# Patient Record
Sex: Female | Born: 1935
Health system: Southern US, Community
[De-identification: ages and names within clinical notes are randomized; demographics above are authoritative.]

## PROBLEM LIST (undated history)

## (undated) DIAGNOSIS — K219 Gastro-esophageal reflux disease without esophagitis: Secondary | ICD-10-CM

## (undated) DIAGNOSIS — C4490 Unspecified malignant neoplasm of skin, unspecified: Secondary | ICD-10-CM

## (undated) DIAGNOSIS — I10 Essential (primary) hypertension: Secondary | ICD-10-CM

## (undated) DIAGNOSIS — E039 Hypothyroidism, unspecified: Secondary | ICD-10-CM

## (undated) DIAGNOSIS — J449 Chronic obstructive pulmonary disease, unspecified: Secondary | ICD-10-CM

## (undated) DIAGNOSIS — E785 Hyperlipidemia, unspecified: Secondary | ICD-10-CM

## (undated) DIAGNOSIS — Z8489 Family history of other specified conditions: Secondary | ICD-10-CM

## (undated) DIAGNOSIS — R0600 Dyspnea, unspecified: Secondary | ICD-10-CM

## (undated) DIAGNOSIS — K649 Unspecified hemorrhoids: Secondary | ICD-10-CM

## (undated) DIAGNOSIS — R600 Localized edema: Secondary | ICD-10-CM

## (undated) DIAGNOSIS — G4730 Sleep apnea, unspecified: Secondary | ICD-10-CM

## (undated) DIAGNOSIS — M48061 Spinal stenosis, lumbar region without neurogenic claudication: Secondary | ICD-10-CM

## (undated) DIAGNOSIS — T4145XA Adverse effect of unspecified anesthetic, initial encounter: Secondary | ICD-10-CM

## (undated) DIAGNOSIS — E079 Disorder of thyroid, unspecified: Secondary | ICD-10-CM

## (undated) DIAGNOSIS — C801 Malignant (primary) neoplasm, unspecified: Secondary | ICD-10-CM

## (undated) DIAGNOSIS — R011 Cardiac murmur, unspecified: Secondary | ICD-10-CM

## (undated) DIAGNOSIS — Z923 Personal history of irradiation: Secondary | ICD-10-CM

## (undated) DIAGNOSIS — T8859XA Other complications of anesthesia, initial encounter: Secondary | ICD-10-CM

## (undated) DIAGNOSIS — C50919 Malignant neoplasm of unspecified site of unspecified female breast: Secondary | ICD-10-CM

## (undated) DIAGNOSIS — E119 Type 2 diabetes mellitus without complications: Secondary | ICD-10-CM

## (undated) DIAGNOSIS — Z87442 Personal history of urinary calculi: Secondary | ICD-10-CM

## (undated) HISTORY — DX: Unspecified malignant neoplasm of skin, unspecified: C44.90

## (undated) HISTORY — PX: TONSILLECTOMY: SUR1361

## (undated) HISTORY — PX: CHOLECYSTECTOMY: SHX55

## (undated) HISTORY — PX: JOINT REPLACEMENT: SHX530

## (undated) HISTORY — PX: SKIN CANCER EXCISION: SHX779

---
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
## 1980-06-29 HISTORY — PX: ABDOMINAL HYSTERECTOMY: SHX81

## 2001-06-29 HISTORY — PX: THYROIDECTOMY, PARTIAL: SHX18

## 2001-06-29 HISTORY — PX: EYE SURGERY: SHX253

## 2001-06-29 HISTORY — PX: OTHER SURGICAL HISTORY: SHX169

## 2004-05-07 ENCOUNTER — Ambulatory Visit: Payer: Self-pay | Admitting: Unknown Physician Specialty

## 2004-08-05 ENCOUNTER — Ambulatory Visit: Payer: Self-pay | Admitting: Unknown Physician Specialty

## 2005-08-25 ENCOUNTER — Ambulatory Visit: Payer: Self-pay | Admitting: Unknown Physician Specialty

## 2006-01-21 ENCOUNTER — Ambulatory Visit: Payer: Self-pay | Admitting: Gastroenterology

## 2006-09-27 ENCOUNTER — Ambulatory Visit: Payer: Self-pay | Admitting: Unknown Physician Specialty

## 2006-09-29 ENCOUNTER — Ambulatory Visit: Payer: Self-pay | Admitting: Unknown Physician Specialty

## 2007-05-11 ENCOUNTER — Ambulatory Visit: Payer: Self-pay | Admitting: Unknown Physician Specialty

## 2008-01-27 ENCOUNTER — Ambulatory Visit: Payer: Self-pay | Admitting: Unknown Physician Specialty

## 2008-03-12 ENCOUNTER — Ambulatory Visit: Payer: Self-pay | Admitting: Unknown Physician Specialty

## 2008-03-12 ENCOUNTER — Other Ambulatory Visit: Payer: Self-pay

## 2008-03-26 ENCOUNTER — Inpatient Hospital Stay: Payer: Self-pay | Admitting: Unknown Physician Specialty

## 2008-03-30 ENCOUNTER — Encounter: Payer: Self-pay | Admitting: Internal Medicine

## 2009-01-28 ENCOUNTER — Ambulatory Visit: Payer: Self-pay | Admitting: Unknown Physician Specialty

## 2010-01-13 ENCOUNTER — Ambulatory Visit: Payer: Self-pay | Admitting: Unknown Physician Specialty

## 2010-01-21 ENCOUNTER — Inpatient Hospital Stay: Payer: Self-pay | Admitting: Unknown Physician Specialty

## 2010-04-15 ENCOUNTER — Ambulatory Visit: Payer: Self-pay | Admitting: Unknown Physician Specialty

## 2010-05-01 ENCOUNTER — Ambulatory Visit: Payer: Self-pay | Admitting: Anesthesiology

## 2010-05-29 ENCOUNTER — Ambulatory Visit: Payer: Self-pay | Admitting: Anesthesiology

## 2010-07-09 ENCOUNTER — Ambulatory Visit: Payer: Self-pay | Admitting: Anesthesiology

## 2010-07-16 ENCOUNTER — Ambulatory Visit: Payer: Self-pay | Admitting: Unknown Physician Specialty

## 2010-07-17 LAB — PATHOLOGY REPORT

## 2010-08-20 ENCOUNTER — Ambulatory Visit: Payer: Self-pay | Admitting: Anesthesiology

## 2010-10-21 ENCOUNTER — Ambulatory Visit: Payer: Self-pay | Admitting: Unknown Physician Specialty

## 2011-02-21 ENCOUNTER — Emergency Department: Payer: Self-pay | Admitting: Internal Medicine

## 2011-03-05 ENCOUNTER — Ambulatory Visit: Payer: Self-pay | Admitting: General Surgery

## 2011-03-30 ENCOUNTER — Ambulatory Visit: Payer: Self-pay | Admitting: General Surgery

## 2011-05-27 ENCOUNTER — Ambulatory Visit: Payer: Self-pay | Admitting: Unknown Physician Specialty

## 2012-06-29 HISTORY — PX: JOINT REPLACEMENT: SHX530

## 2012-07-28 ENCOUNTER — Ambulatory Visit: Payer: Self-pay | Admitting: Family Medicine

## 2012-10-17 DIAGNOSIS — D3500 Benign neoplasm of unspecified adrenal gland: Secondary | ICD-10-CM | POA: Insufficient documentation

## 2012-10-17 DIAGNOSIS — E049 Nontoxic goiter, unspecified: Secondary | ICD-10-CM | POA: Insufficient documentation

## 2013-05-15 DIAGNOSIS — E1165 Type 2 diabetes mellitus with hyperglycemia: Secondary | ICD-10-CM | POA: Insufficient documentation

## 2013-05-15 DIAGNOSIS — E119 Type 2 diabetes mellitus without complications: Secondary | ICD-10-CM | POA: Insufficient documentation

## 2013-12-04 ENCOUNTER — Emergency Department: Payer: Self-pay | Admitting: Emergency Medicine

## 2013-12-04 LAB — CBC WITH DIFFERENTIAL/PLATELET
BASOS ABS: 0.1 10*3/uL (ref 0.0–0.1)
Basophil %: 1.1 %
EOS PCT: 1.6 %
Eosinophil #: 0.1 10*3/uL (ref 0.0–0.7)
HCT: 41.6 % (ref 35.0–47.0)
HGB: 13.6 g/dL (ref 12.0–16.0)
LYMPHS ABS: 1.9 10*3/uL (ref 1.0–3.6)
LYMPHS PCT: 25.4 %
MCH: 30.1 pg (ref 26.0–34.0)
MCHC: 32.8 g/dL (ref 32.0–36.0)
MCV: 92 fL (ref 80–100)
Monocyte #: 0.5 x10 3/mm (ref 0.2–0.9)
Monocyte %: 6.3 %
NEUTROS ABS: 4.9 10*3/uL (ref 1.4–6.5)
Neutrophil %: 65.6 %
PLATELETS: 302 10*3/uL (ref 150–440)
RBC: 4.54 10*6/uL (ref 3.80–5.20)
RDW: 12.9 % (ref 11.5–14.5)
WBC: 7.4 10*3/uL (ref 3.6–11.0)

## 2013-12-04 LAB — URINALYSIS, COMPLETE
Bacteria: NONE SEEN
Bilirubin,UR: NEGATIVE
Blood: NEGATIVE
Glucose,UR: NEGATIVE mg/dL (ref 0–75)
Hyaline Cast: 22
Ketone: NEGATIVE
Nitrite: NEGATIVE
PH: 5 (ref 4.5–8.0)
RBC,UR: 1 /HPF (ref 0–5)
Specific Gravity: 1.017 (ref 1.003–1.030)
Squamous Epithelial: 6

## 2013-12-04 LAB — BASIC METABOLIC PANEL
Anion Gap: 6 — ABNORMAL LOW (ref 7–16)
BUN: 20 mg/dL — ABNORMAL HIGH (ref 7–18)
CREATININE: 0.92 mg/dL (ref 0.60–1.30)
Calcium, Total: 9.3 mg/dL (ref 8.5–10.1)
Chloride: 102 mmol/L (ref 98–107)
Co2: 28 mmol/L (ref 21–32)
EGFR (African American): >60
GFR CALC NON AF AMER: 59 — AB
Glucose: 197 mg/dL — ABNORMAL HIGH (ref 65–99)
OSMOLALITY: 280 (ref 275–301)
Potassium: 4.4 mmol/L (ref 3.5–5.1)
Sodium: 136 mmol/L (ref 136–145)

## 2013-12-04 LAB — TROPONIN I: Troponin-I: <0.02 ng/mL

## 2013-12-19 DIAGNOSIS — E785 Hyperlipidemia, unspecified: Secondary | ICD-10-CM | POA: Insufficient documentation

## 2013-12-19 DIAGNOSIS — G4730 Sleep apnea, unspecified: Secondary | ICD-10-CM | POA: Insufficient documentation

## 2013-12-19 DIAGNOSIS — K219 Gastro-esophageal reflux disease without esophagitis: Secondary | ICD-10-CM

## 2013-12-19 DIAGNOSIS — I10 Essential (primary) hypertension: Secondary | ICD-10-CM | POA: Insufficient documentation

## 2014-09-12 ENCOUNTER — Ambulatory Visit: Payer: Self-pay

## 2014-10-22 ENCOUNTER — Ambulatory Visit
Admit: 2014-10-22 | Disposition: A | Discharge status: Home / Self Care | Payer: Self-pay | Attending: Physical Medicine and Rehabilitation | Admitting: Physical Medicine and Rehabilitation

## 2014-11-12 DIAGNOSIS — M5137 Other intervertebral disc degeneration, lumbosacral region: Secondary | ICD-10-CM | POA: Insufficient documentation

## 2014-11-12 DIAGNOSIS — M5136 Other intervertebral disc degeneration, lumbar region: Secondary | ICD-10-CM | POA: Insufficient documentation

## 2014-11-12 DIAGNOSIS — M4716 Other spondylosis with myelopathy, lumbar region: Secondary | ICD-10-CM | POA: Insufficient documentation

## 2014-11-12 DIAGNOSIS — M51379 Other intervertebral disc degeneration, lumbosacral region without mention of lumbar back pain or lower extremity pain: Secondary | ICD-10-CM | POA: Insufficient documentation

## 2014-11-12 DIAGNOSIS — M48061 Spinal stenosis, lumbar region without neurogenic claudication: Secondary | ICD-10-CM | POA: Insufficient documentation

## 2014-11-13 ENCOUNTER — Encounter: Payer: Self-pay | Admitting: Dietician

## 2014-11-13 ENCOUNTER — Encounter: Payer: Medicare Other | Attending: Nurse Practitioner | Admitting: Dietician

## 2014-11-13 VITALS — Ht 60.0 in | Wt 224.1 lb

## 2014-11-13 DIAGNOSIS — E119 Type 2 diabetes mellitus without complications: Secondary | ICD-10-CM | POA: Insufficient documentation

## 2014-11-13 DIAGNOSIS — E669 Obesity, unspecified: Secondary | ICD-10-CM | POA: Insufficient documentation

## 2014-11-13 NOTE — Progress Notes (Signed)
Medical Nutrition Therapy: Visit start time: 0900  end time: 1000  Assessment:  Diagnosis: Type 2 DM, obesity Past medical history: hypoglycemia, HTN, hyperlipidemia, GERD Psychosocial issues/ stress concerns: none, frustration, feeling bad a out self Preferred learning method:  . No preference indicated  Current weight: 224.1  Height: 11ft 0in Medications, supplements: listed in chart Progress and evaluation: Patient reports struggling to lose weight; she has experienced weight gain in past several years since husband's death and knee and hip surgeries.           She has also struggled to control Diabetes with declining physical activity and decreased attention to eating habits.           She hopes to make some lifestyle changes now to improve health and pain. Physical activity: none  Dietary Intake:  Usual eating pattern includes 2-3 meals and 2-3 snacks per day. Dining out frequency: 10-13 meals per week.  Breakfast: sometimes cereal, small amount of protein shake (4oz), sometimes biscuit was also eating hash browns, 1/2-sweet tea Snack: crackers and cheese, pretzels, chips, occasionally fruit (not satisfying) Lunch: sometimes none; salad or chicken and vegetables, chicken sandwich Snack: chips or occasional candy (tries not to keep in house) Supper: salad or [place] (lasts for 2 meals) Snack: usually salty snack, sometimes toast and tea or rice pudding Beverages: very little soda, prefers not to use diet drinks, water, tea, coffee  Nutrition Care Education: Topics covered: diabetes, weight control Basic nutrition: basic food groups, appropriate nutrient balance, general nutrition guidelines    Weight control: benefits of weight control, behavioral changes for weight loss Advanced nutrition:  dining out Diabetes: appropriate meal and snack schedule, appropriate carb intake and balance Other lifestyle changes:  benefits of making changes, identifying habits that need to  change  Nutritional Diagnosis:  [place]-3.3 Overweight/obesity As related to decreased mobility, pain, and lack of attention to diet.  As evidenced by patient self-report.  Intervention: Instruction as noted above.    Provided guidance for 1100-1200kcal meal plan to allow for weight loss.   Encouraged pt to gradually increase safe exercise (she has recumbant bike) as tolerated.   Reset correct time on patient's blood glucose monitor per patient's request.   Education Materials given:  . General diet guidelines for Diabetes . Food lists/ Planning A Balanced Meal . Sample meal pattern/ menus . Goals/ instructions . Other list of definitions of food expiration dates and safety per pt request  Learner/ who was taught:  . Patient   Level of understanding: Marland Kitchen Verbalizes/ demonstrates competency  Demonstrated degree of understanding via:   Teach back Learning barriers: . None  Willingness to learn/ readiness for change: . Acceptance, ready for change  Monitoring and Evaluation:  Dietary intake, exercise, BG control, and body weight 12/18/14

## 2014-11-13 NOTE — Patient Instructions (Signed)
Prepare more meals at home  Plan balanced meals to include palm-sized or less of protein foods, small starch portions (1/2 cup), and generous portions of low-carb vegetables.  Begin using recumbant bike for a few minutes at a time, at least once a day. Portion snacks using small plate or bowl.

## 2014-11-22 ENCOUNTER — Encounter: Payer: Self-pay | Admitting: *Deleted

## 2014-11-23 ENCOUNTER — Encounter: Admission: RE | Payer: Self-pay | Source: Ambulatory Visit

## 2014-11-23 ENCOUNTER — Ambulatory Visit
Admission: RE | Admit: 2014-11-23 | Payer: Medicare Other | Source: Ambulatory Visit | Admitting: Unknown Physician Specialty

## 2014-11-23 HISTORY — DX: Hyperlipidemia, unspecified: E78.5

## 2014-11-23 HISTORY — DX: Sleep apnea, unspecified: G47.30

## 2014-11-23 HISTORY — DX: Essential (primary) hypertension: I10

## 2014-11-23 HISTORY — DX: Type 2 diabetes mellitus without complications: E11.9

## 2014-11-23 SURGERY — COLONOSCOPY
Anesthesia: Monitor Anesthesia Care

## 2014-12-18 ENCOUNTER — Encounter: Payer: Medicare Other | Attending: Nurse Practitioner | Admitting: Dietician

## 2014-12-18 VITALS — Ht 60.0 in | Wt 218.9 lb

## 2014-12-18 DIAGNOSIS — E119 Type 2 diabetes mellitus without complications: Secondary | ICD-10-CM | POA: Diagnosis present

## 2014-12-18 DIAGNOSIS — M5416 Radiculopathy, lumbar region: Secondary | ICD-10-CM | POA: Insufficient documentation

## 2014-12-18 DIAGNOSIS — E669 Obesity, unspecified: Secondary | ICD-10-CM | POA: Insufficient documentation

## 2014-12-18 NOTE — Progress Notes (Signed)
Medical Nutrition Therapy: Visit start time: 1100  end time: 1130  Assessment:  Diagnosis: Type 2 DM, obesity Past medical history: HTN, hyperlipidemia, Degenerative Disc Disease, arthritis in spine, sleep apnea Psychosocial issues/ stress concerns: Patient reports concern over elevated BG readings  Current weight: 218.9lbs  Height: 5'0" Medications, supplements: list reconciled in chart Progress and evaluation: patient reports limiting snacks, eating smaller portions at meals.   Physical activity: no formal exercise, stays busy during the day  Dietary Intake:  Usual eating pattern includes 3 meals and 1-2 snacks per day. Dining out frequency: 3-4 meals per week.  Breakfast: greek yogurt with seeds, milk; or egg with ham and cheese, or bagel with cream cheese Snack: usually none Lunch: salad, with chopped deli meat or chicken Snack: occasional baked chips Supper: cereal and milk or yogurt and milk; had chinese food with steamed veg, small amount rice.  Snack: sometimes yogurt Beverages: water, usually no soda or juice. Did try small glass of blueberry juice.   Nutrition Care Education: Topics covered: weight control, BG control Basic nutrition: general nutrition guidelines    Weight control: portion control, adequacy of food and nutrient intake Diabetes:  goals for BGs, appropriate carb intake and balance, interpreting BG results Other lifestyle changes:  Reviewed benefit of beginning some regular exercise, in particular using recumbent bike  Nutritional Diagnosis:  [place]-3.3 Overweight/obesity As related to history of excess caloric intake, inactivity.  As evidenced by BMI of 42.5.  Intervention: Discussion as noted above.    Also discussed side effects of medications, as patient has been experiencing some diarrhea recently.    Will contact pt by phone in 4-6 weeks to monitor progress.  Education Materials given:   Learner/ who was taught:  . Patient   Level of  understanding: Marland Kitchen Verbalizes/ demonstrates competency  Demonstrated degree of understanding via:   Teach back Learning barriers: . None  Willingness to learn/ readiness for change: . Eager, change in progress  Monitoring and Evaluation:  Dietary intake, exercise, BG control, and body weight      follow up: by phone in 4-6 weeks, to schedule additional visit at that time if needed.

## 2014-12-27 DIAGNOSIS — E039 Hypothyroidism, unspecified: Secondary | ICD-10-CM | POA: Insufficient documentation

## 2015-07-22 DIAGNOSIS — L570 Actinic keratosis: Secondary | ICD-10-CM | POA: Diagnosis not present

## 2015-07-22 DIAGNOSIS — X32XXXA Exposure to sunlight, initial encounter: Secondary | ICD-10-CM | POA: Diagnosis not present

## 2015-07-23 DIAGNOSIS — D0471 Carcinoma in situ of skin of right lower limb, including hip: Secondary | ICD-10-CM | POA: Diagnosis not present

## 2015-07-23 DIAGNOSIS — L570 Actinic keratosis: Secondary | ICD-10-CM | POA: Diagnosis not present

## 2015-07-23 DIAGNOSIS — D485 Neoplasm of uncertain behavior of skin: Secondary | ICD-10-CM | POA: Diagnosis not present

## 2015-07-23 DIAGNOSIS — L820 Inflamed seborrheic keratosis: Secondary | ICD-10-CM | POA: Diagnosis not present

## 2015-08-15 DIAGNOSIS — D0471 Carcinoma in situ of skin of right lower limb, including hip: Secondary | ICD-10-CM | POA: Diagnosis not present

## 2015-08-15 DIAGNOSIS — X32XXXA Exposure to sunlight, initial encounter: Secondary | ICD-10-CM | POA: Diagnosis not present

## 2015-08-15 DIAGNOSIS — L570 Actinic keratosis: Secondary | ICD-10-CM | POA: Diagnosis not present

## 2015-08-20 DIAGNOSIS — L570 Actinic keratosis: Secondary | ICD-10-CM | POA: Diagnosis not present

## 2015-08-29 DIAGNOSIS — E119 Type 2 diabetes mellitus without complications: Secondary | ICD-10-CM | POA: Diagnosis not present

## 2015-08-29 DIAGNOSIS — E782 Mixed hyperlipidemia: Secondary | ICD-10-CM | POA: Diagnosis not present

## 2015-08-29 DIAGNOSIS — M5137 Other intervertebral disc degeneration, lumbosacral region: Secondary | ICD-10-CM | POA: Diagnosis not present

## 2015-08-29 DIAGNOSIS — I10 Essential (primary) hypertension: Secondary | ICD-10-CM | POA: Diagnosis not present

## 2015-09-03 DIAGNOSIS — I10 Essential (primary) hypertension: Secondary | ICD-10-CM | POA: Diagnosis not present

## 2015-09-03 DIAGNOSIS — E782 Mixed hyperlipidemia: Secondary | ICD-10-CM | POA: Diagnosis not present

## 2015-09-03 DIAGNOSIS — E119 Type 2 diabetes mellitus without complications: Secondary | ICD-10-CM | POA: Diagnosis not present

## 2015-09-12 DIAGNOSIS — M545 Low back pain: Secondary | ICD-10-CM | POA: Diagnosis not present

## 2015-09-12 DIAGNOSIS — E119 Type 2 diabetes mellitus without complications: Secondary | ICD-10-CM | POA: Diagnosis not present

## 2015-09-12 DIAGNOSIS — E782 Mixed hyperlipidemia: Secondary | ICD-10-CM | POA: Diagnosis not present

## 2015-09-12 DIAGNOSIS — I10 Essential (primary) hypertension: Secondary | ICD-10-CM | POA: Diagnosis not present

## 2015-11-05 ENCOUNTER — Inpatient Hospital Stay
Admission: EM | Admit: 2015-11-05 | Discharge: 2015-11-07 | DRG: 371 | Disposition: A | Discharge status: Home / Self Care | Payer: PPO | Attending: Specialist | Admitting: Specialist

## 2015-11-05 DIAGNOSIS — E785 Hyperlipidemia, unspecified: Secondary | ICD-10-CM | POA: Diagnosis present

## 2015-11-05 DIAGNOSIS — Z886 Allergy status to analgesic agent status: Secondary | ICD-10-CM | POA: Diagnosis not present

## 2015-11-05 DIAGNOSIS — E890 Postprocedural hypothyroidism: Secondary | ICD-10-CM | POA: Diagnosis present

## 2015-11-05 DIAGNOSIS — R112 Nausea with vomiting, unspecified: Secondary | ICD-10-CM

## 2015-11-05 DIAGNOSIS — R0902 Hypoxemia: Secondary | ICD-10-CM | POA: Diagnosis not present

## 2015-11-05 DIAGNOSIS — G4733 Obstructive sleep apnea (adult) (pediatric): Secondary | ICD-10-CM | POA: Diagnosis present

## 2015-11-05 DIAGNOSIS — Z880 Allergy status to penicillin: Secondary | ICD-10-CM

## 2015-11-05 DIAGNOSIS — Z6841 Body Mass Index (BMI) 40.0 and over, adult: Secondary | ICD-10-CM | POA: Diagnosis not present

## 2015-11-05 DIAGNOSIS — J9621 Acute and chronic respiratory failure with hypoxia: Secondary | ICD-10-CM | POA: Diagnosis present

## 2015-11-05 DIAGNOSIS — J984 Other disorders of lung: Secondary | ICD-10-CM | POA: Diagnosis not present

## 2015-11-05 DIAGNOSIS — Z8249 Family history of ischemic heart disease and other diseases of the circulatory system: Secondary | ICD-10-CM | POA: Diagnosis not present

## 2015-11-05 DIAGNOSIS — A047 Enterocolitis due to Clostridium difficile: Secondary | ICD-10-CM | POA: Diagnosis not present

## 2015-11-05 DIAGNOSIS — I10 Essential (primary) hypertension: Secondary | ICD-10-CM | POA: Diagnosis not present

## 2015-11-05 DIAGNOSIS — Z885 Allergy status to narcotic agent status: Secondary | ICD-10-CM | POA: Diagnosis not present

## 2015-11-05 DIAGNOSIS — Z79899 Other long term (current) drug therapy: Secondary | ICD-10-CM | POA: Diagnosis not present

## 2015-11-05 DIAGNOSIS — R531 Weakness: Secondary | ICD-10-CM

## 2015-11-05 DIAGNOSIS — Z9981 Dependence on supplemental oxygen: Secondary | ICD-10-CM | POA: Diagnosis not present

## 2015-11-05 DIAGNOSIS — J449 Chronic obstructive pulmonary disease, unspecified: Secondary | ICD-10-CM | POA: Diagnosis present

## 2015-11-05 DIAGNOSIS — Z9049 Acquired absence of other specified parts of digestive tract: Secondary | ICD-10-CM

## 2015-11-05 DIAGNOSIS — K5790 Diverticulosis of intestine, part unspecified, without perforation or abscess without bleeding: Secondary | ICD-10-CM | POA: Diagnosis not present

## 2015-11-05 DIAGNOSIS — R197 Diarrhea, unspecified: Secondary | ICD-10-CM | POA: Diagnosis not present

## 2015-11-05 DIAGNOSIS — Z882 Allergy status to sulfonamides status: Secondary | ICD-10-CM

## 2015-11-05 DIAGNOSIS — A0472 Enterocolitis due to Clostridium difficile, not specified as recurrent: Secondary | ICD-10-CM

## 2015-11-05 DIAGNOSIS — Z888 Allergy status to other drugs, medicaments and biological substances status: Secondary | ICD-10-CM

## 2015-11-05 DIAGNOSIS — E119 Type 2 diabetes mellitus without complications: Secondary | ICD-10-CM

## 2015-11-05 DIAGNOSIS — K5730 Diverticulosis of large intestine without perforation or abscess without bleeding: Secondary | ICD-10-CM | POA: Diagnosis not present

## 2015-11-05 DIAGNOSIS — G4730 Sleep apnea, unspecified: Secondary | ICD-10-CM | POA: Diagnosis not present

## 2015-11-05 DIAGNOSIS — Z87891 Personal history of nicotine dependence: Secondary | ICD-10-CM

## 2015-11-05 DIAGNOSIS — G4710 Hypersomnia, unspecified: Secondary | ICD-10-CM | POA: Diagnosis not present

## 2015-11-05 DIAGNOSIS — J438 Other emphysema: Secondary | ICD-10-CM | POA: Diagnosis not present

## 2015-11-05 DIAGNOSIS — K219 Gastro-esophageal reflux disease without esophagitis: Secondary | ICD-10-CM | POA: Diagnosis present

## 2015-11-05 LAB — CBC
HCT: 44.1 % (ref 35.0–47.0)
Hemoglobin: 14.7 g/dL (ref 12.0–16.0)
MCH: 29.6 pg (ref 26.0–34.0)
MCHC: 33.3 g/dL (ref 32.0–36.0)
MCV: 88.7 fL (ref 80.0–100.0)
Platelets: 314 10*3/uL (ref 150–440)
RBC: 4.97 MIL/uL (ref 3.80–5.20)
RDW: 13.4 % (ref 11.5–14.5)
WBC: 18.5 10*3/uL — ABNORMAL HIGH (ref 3.6–11.0)

## 2015-11-05 MED ORDER — ONDANSETRON HCL 4 MG/2ML IJ SOLN
4.0000 mg | Freq: Once | INTRAMUSCULAR | Status: AC
  Administered 2015-11-06: 4 mg via INTRAVENOUS
  Filled 2015-11-05: qty 2

## 2015-11-05 MED ORDER — SODIUM CHLORIDE 0.9 % IV BOLUS (SEPSIS)
1000.0000 mL | Freq: Once | INTRAVENOUS | Status: AC
  Administered 2015-11-06: 1000 mL via INTRAVENOUS

## 2015-11-05 NOTE — ED Notes (Addendum)
Pt in with co n.v.d since today has had diaphoresis through out the day. Pt pale and diaphoretic in triage states had syncopal episode while having bowel movement.

## 2015-11-05 NOTE — ED Provider Notes (Addendum)
[facility] Emergency Department Provider Note   ____________________________________________  Time seen: Approximately 11:41 PM  I have reviewed the triage vital signs and the nursing notes.   HISTORY  Chief Complaint Emesis    HPI Kelsey Young is a 82 y.o. female who presents to the ED from home with a chief complaint of nausea, vomiting and diarrhea. Patient reports onset of diarrhea approximately 11 AM. She has had 4 large volume episodes since. Had near syncopal episode on the commode while having loose bowel movements. No diarrhea for the past hour. Vomiting started approximately 4 PM and patient has had 2 episodes of vomiting since. She notes diaphoresis throughout the day. Denies associated fever, chills, chest pain, shortness of breath, abdominal pain, dysuria, bloody stools. No sick contacts. Denies recent travel, trauma or antibiotic use. Nothing makes her symptoms better or worse.   Past Medical History  Diagnosis Date  . Diabetes mellitus without complication   . Hypertension   . Sleep apnea   . Hyperlipidemia     Patient Active Problem List   Diagnosis Date Noted  . Hypoxia 11/06/2015  . DDD (degenerative disc disease), lumbar 11/12/2014  . Lumbar canal stenosis 11/12/2014  . Degenerative arthritis of lumbar spine with cord compression 11/12/2014  . Acid reflux 12/19/2013  . HLD (hyperlipidemia) 12/19/2013  . BP (high blood pressure) 12/19/2013  . Apnea, sleep 12/19/2013  . Diabetes mellitus, type 2 ([place]) 05/15/2013  . Adrenal adenoma 10/17/2012  . Intrathoracic goiter 10/17/2012    Past Surgical History  Procedure Laterality Date  . Cholecystectomy      Current Outpatient Rx  Name  Route  Sig  Dispense  Refill  . Coenzyme Q10 (CO Q 10 PO)   Oral   Take 1 tablet by mouth daily.         Marland Kitchen diltiazem (TIAZAC) 300 MG 24 hr capsule   Oral   Take 300 mg by mouth daily.         Marland Kitchen levothyroxine (SYNTHROID, LEVOTHROID)  75 MCG tablet   Oral   Take 75 mcg by mouth daily before breakfast.         . losartan (COZAAR) 50 MG tablet   Oral   Take 50 mg by mouth daily.         . metFORMIN (GLUCOPHAGE-XR) 500 MG 24 hr tablet   Oral   Take 500 mg by mouth at bedtime.          . metoprolol succinate (TOPROL-XL) 50 MG 24 hr tablet   Oral   Take 50 mg by mouth at bedtime. Take with or immediately following a meal.         . Multiple Vitamins-Minerals (PRESERVISION AREDS PO)   Oral   Take 1 tablet by mouth daily.         Marland Kitchen omeprazole (PRILOSEC) 20 MG capsule   Oral   Take 20 mg by mouth daily.         . pravastatin (PRAVACHOL) 40 MG tablet   Oral   Take 40 mg by mouth at bedtime.          . sitaGLIPtin (JANUVIA) 50 MG tablet   Oral   Take 50 mg by mouth daily.         Marland Kitchen triamterene-hydrochlorothiazide (MAXZIDE-25) 37.5-25 MG per tablet   Oral   Take 0.5 tablets by mouth daily.         . calcium carbonate (TUMS - DOSED IN MG ELEMENTAL  CALCIUM) 500 MG chewable tablet   Oral   Chew 1 tablet by mouth daily.         . DULoxetine (CYMBALTA) 20 MG capsule   Oral   Take 20 mg by mouth daily.         . Glucosamine HCl POWD   Oral   Take by mouth.         . Glucosamine-Chondroitin (GLUCOSAMINE CHONDR COMPLEX PO)   Oral   Take 1 tablet by mouth daily.         . naproxen sodium (ANAPROX) 220 MG tablet   Oral   Take 220 mg by mouth 2 (two) times daily as needed (back pain).         . triamcinolone ointment (KENALOG) 0.1 %            0     Allergies Contrast media; Sulfur; Codeine; Morphine; Oxycodone; Penicillins; and Sulfa antibiotics  No family history on file.  Social History Social History  Substance Use Topics  . Smoking status: Former Research scientist (life sciences)  . Smokeless tobacco: Never Used  . Alcohol Use: 0.6 - 1.2 oz/week    1-2 Standard drinks or equivalent per week    Review of Systems  Constitutional: No fever/chills. Eyes: No visual changes. ENT: No sore  throat. Cardiovascular: Denies chest pain. Respiratory: Denies shortness of breath. Gastrointestinal: No abdominal pain.  Positive for nausea, vomiting and diarrhea.  No constipation. Genitourinary: Negative for dysuria. Musculoskeletal: Negative for back pain. Skin: Negative for rash. Neurological: Negative for headaches, focal weakness or numbness.  10-point ROS otherwise negative.  ____________________________________________   PHYSICAL EXAM:  VITAL SIGNS: ED Triage Vitals  Enc Vitals Group     BP 11/05/15 2251 124/53 mmHg     Pulse Rate 11/05/15 2251 90     Resp 11/05/15 2251 18     Temp 11/05/15 2253 97.6 F (36.4 C)     Temp Source 11/05/15 2253 Oral     SpO2 11/05/15 2251 94 %     Weight 11/05/15 2249 210 lb (95.255 kg)     Height 11/05/15 2249 4\' 11"  (1.499 m)     Head Cir --      Peak Flow --      Pain Score 11/05/15 2249 7     Pain Loc --      Pain Edu? --      Excl. in [place]? --     Constitutional: Alert and oriented. Well appearing and in mild acute distress. Eyes: Conjunctivae are normal. PERRL. EOMI. Head: Atraumatic. Nose: No congestion/rhinnorhea. Mouth/Throat: Mucous membranes are mildly dry.  Oropharynx non-erythematous. Neck: No stridor.   Cardiovascular: Normal rate, regular rhythm. Grossly normal heart sounds.  Good peripheral circulation. Respiratory: Normal respiratory effort.  No retractions. Lungs CTAB. Gastrointestinal: Soft and nontender to light or deep palpation. No distention. No abdominal bruits. No CVA tenderness. Musculoskeletal: No lower extremity tenderness nor edema.  No joint effusions. Neurologic:  Normal speech and language. No gross focal neurologic deficits are appreciated. No gait instability. Skin:  Skin is pale, warm, dry and intact. No rash noted. Psychiatric: Mood and affect are normal. Speech and behavior are normal.  ____________________________________________   LABS (all labs ordered are listed, but only abnormal  results are displayed)  Labs Reviewed  COMPREHENSIVE METABOLIC PANEL - Abnormal; Notable for the following:    Chloride 99 (*)    Glucose, Bld 189 (*)    BUN 25 (*)    Creatinine, Ser 1.12 (*)  GFR calc non Af Amer 45 (*)    GFR calc Af Amer 52 (*)    All other components within normal limits  CBC - Abnormal; Notable for the following:    WBC 18.5 (*)    All other components within normal limits  GASTROINTESTINAL PANEL BY PCR, STOOL (REPLACES STOOL CULTURE)  C DIFFICILE QUICK SCREEN W PCR REFLEX  CLOSTRIDIUM DIFFICILE BY PCR  LIPASE, BLOOD  TROPONIN I  URINALYSIS COMPLETEWITH MICROSCOPIC ([facility] ONLY)   ____________________________________________  EKG  ED ECG REPORT I, SUNG,JADE J, the attending physician, personally viewed and interpreted this ECG.   Date: 11/06/2015  EKG Time: 2301  Rate: 89  Rhythm: normal EKG, normal sinus rhythm  Axis: Normal  Intervals:none  ST&T Change: Nonspecific  ____________________________________________  RADIOLOGY  Portable CXR (viewed by me, interpreted per Dr. Quintella Reichert): No active disease.  CT abdomen/pelvis interpreted per Dr. Alroy Dust: No acute findings are evident in the abdomen or pelvis.  Diverticulosis  2 cm left adrenal nodule, more likely benign based on its low-attenuation but not conclusively characterized. Consider a follow-up CT or MRI in 12 months. ____________________________________________   PROCEDURES  Procedure(s) performed: None  Critical Care performed: No  ____________________________________________   INITIAL IMPRESSION / ASSESSMENT AND PLAN / ED COURSE  Pertinent labs & imaging results that were available during my care of the patient were reviewed by me and considered in my medical decision making (see chart for details).  82 year old female who presents for nausea, vomiting and diarrhea with near syncopal episode while on the toilet. Without intervention patient is feeling better. It has  been one hour since her last episode of vomiting and diarrhea. Will initiate IV fluid resuscitation, IV antiemetic, collect stool specimen if patient is able to provide, and reassess.  ----------------------------------------- 12:58 AM on 11/06/2015 -----------------------------------------  Nausea improved. Patient crunching ice chips without emesis. Room air saturations 89-90%. Patient reports she has been recommended to wear CPAP at night but she declines. Will obtain chest x-ray and placed on 2 L nasal cannula oxygen. Updated patient and her daughter of laboratory results notable for leukocytosis. Will proceed with CT abdomen/pelvis to evaluate for intra-abdominal pathology. Of note, patient attempted to urinate but missed the collection device. IV fluids infusing and patient will try again later.   ----------------------------------------- 3:49 AM on 11/06/2015 -----------------------------------------  Patient ambulated a short distance from commode to her bed and her oxygenation dropped to 85%. She is currently tachypneic and dyspneic. Updated patient her daughter of negative CT imaging results as well as negative [employer]. Will discuss with hospitalist to evaluate patient in the emergency department for admission for hypoxia and generalized weakness.  ----------------------------------------- 4:54 AM on 11/06/2015 -----------------------------------------  Lab called with positive C. difficile. Dr. Marcille Blanco notified. ____________________________________________   FINAL CLINICAL IMPRESSION(S) / ED DIAGNOSES  Final diagnoses:  Nausea vomiting and diarrhea  Hypoxia  Type 2 diabetes mellitus without complication, unspecified long term insulin use status (HCC)  Weakness  C. difficile colitis      NEW MEDICATIONS STARTED DURING THIS VISIT:  New Prescriptions   No medications on file     Note:  This document was prepared using Dragon voice recognition software and may include  unintentional dictation errors.    Paulette Blanch, MD 11/06/15 [place], MD 11/06/15 [phone]

## 2015-11-06 ENCOUNTER — Emergency Department: Payer: PPO

## 2015-11-06 ENCOUNTER — Encounter: Payer: Self-pay | Admitting: Internal Medicine

## 2015-11-06 DIAGNOSIS — G4730 Sleep apnea, unspecified: Secondary | ICD-10-CM | POA: Diagnosis not present

## 2015-11-06 DIAGNOSIS — J438 Other emphysema: Secondary | ICD-10-CM | POA: Diagnosis not present

## 2015-11-06 DIAGNOSIS — E785 Hyperlipidemia, unspecified: Secondary | ICD-10-CM | POA: Diagnosis not present

## 2015-11-06 DIAGNOSIS — Z880 Allergy status to penicillin: Secondary | ICD-10-CM | POA: Diagnosis not present

## 2015-11-06 DIAGNOSIS — J984 Other disorders of lung: Secondary | ICD-10-CM | POA: Diagnosis not present

## 2015-11-06 DIAGNOSIS — J449 Chronic obstructive pulmonary disease, unspecified: Secondary | ICD-10-CM | POA: Diagnosis not present

## 2015-11-06 DIAGNOSIS — G4710 Hypersomnia, unspecified: Secondary | ICD-10-CM | POA: Diagnosis not present

## 2015-11-06 DIAGNOSIS — Z9049 Acquired absence of other specified parts of digestive tract: Secondary | ICD-10-CM | POA: Diagnosis not present

## 2015-11-06 DIAGNOSIS — Z6841 Body Mass Index (BMI) 40.0 and over, adult: Secondary | ICD-10-CM | POA: Diagnosis not present

## 2015-11-06 DIAGNOSIS — Z9981 Dependence on supplemental oxygen: Secondary | ICD-10-CM | POA: Diagnosis not present

## 2015-11-06 DIAGNOSIS — R0902 Hypoxemia: Secondary | ICD-10-CM | POA: Diagnosis not present

## 2015-11-06 DIAGNOSIS — Z79899 Other long term (current) drug therapy: Secondary | ICD-10-CM | POA: Diagnosis not present

## 2015-11-06 DIAGNOSIS — Z8249 Family history of ischemic heart disease and other diseases of the circulatory system: Secondary | ICD-10-CM | POA: Diagnosis not present

## 2015-11-06 DIAGNOSIS — Z882 Allergy status to sulfonamides status: Secondary | ICD-10-CM | POA: Diagnosis not present

## 2015-11-06 DIAGNOSIS — J9621 Acute and chronic respiratory failure with hypoxia: Secondary | ICD-10-CM | POA: Diagnosis not present

## 2015-11-06 DIAGNOSIS — G4733 Obstructive sleep apnea (adult) (pediatric): Secondary | ICD-10-CM | POA: Diagnosis not present

## 2015-11-06 DIAGNOSIS — Z885 Allergy status to narcotic agent status: Secondary | ICD-10-CM | POA: Diagnosis not present

## 2015-11-06 DIAGNOSIS — Z886 Allergy status to analgesic agent status: Secondary | ICD-10-CM | POA: Diagnosis not present

## 2015-11-06 DIAGNOSIS — R197 Diarrhea, unspecified: Secondary | ICD-10-CM | POA: Diagnosis not present

## 2015-11-06 DIAGNOSIS — A047 Enterocolitis due to Clostridium difficile: Secondary | ICD-10-CM | POA: Diagnosis not present

## 2015-11-06 DIAGNOSIS — R112 Nausea with vomiting, unspecified: Secondary | ICD-10-CM | POA: Diagnosis not present

## 2015-11-06 DIAGNOSIS — R531 Weakness: Secondary | ICD-10-CM | POA: Diagnosis not present

## 2015-11-06 DIAGNOSIS — Z87891 Personal history of nicotine dependence: Secondary | ICD-10-CM | POA: Diagnosis not present

## 2015-11-06 DIAGNOSIS — E119 Type 2 diabetes mellitus without complications: Secondary | ICD-10-CM | POA: Diagnosis not present

## 2015-11-06 DIAGNOSIS — K219 Gastro-esophageal reflux disease without esophagitis: Secondary | ICD-10-CM | POA: Diagnosis not present

## 2015-11-06 DIAGNOSIS — Z888 Allergy status to other drugs, medicaments and biological substances status: Secondary | ICD-10-CM | POA: Diagnosis not present

## 2015-11-06 DIAGNOSIS — K5730 Diverticulosis of large intestine without perforation or abscess without bleeding: Secondary | ICD-10-CM | POA: Diagnosis not present

## 2015-11-06 DIAGNOSIS — I10 Essential (primary) hypertension: Secondary | ICD-10-CM | POA: Diagnosis not present

## 2015-11-06 DIAGNOSIS — E890 Postprocedural hypothyroidism: Secondary | ICD-10-CM | POA: Diagnosis not present

## 2015-11-06 DIAGNOSIS — K5790 Diverticulosis of intestine, part unspecified, without perforation or abscess without bleeding: Secondary | ICD-10-CM | POA: Diagnosis not present

## 2015-11-06 LAB — C DIFFICILE QUICK SCREEN W PCR REFLEX
C DIFFICILE (CDIFF) TOXIN: NEGATIVE
C Diff antigen: POSITIVE — AB

## 2015-11-06 LAB — URINALYSIS COMPLETE WITH MICROSCOPIC (ARMC ONLY)
Bacteria, UA: NONE SEEN
Bilirubin Urine: NEGATIVE
Glucose, UA: NEGATIVE mg/dL
Hgb urine dipstick: NEGATIVE
Ketones, ur: NEGATIVE mg/dL
Leukocytes, UA: NEGATIVE
Nitrite: NEGATIVE
Protein, ur: NEGATIVE mg/dL
Specific Gravity, Urine: 1.012 (ref 1.005–1.030)
pH: 6 (ref 5.0–8.0)

## 2015-11-06 LAB — CLOSTRIDIUM DIFFICILE BY PCR: CDIFFPCR: POSITIVE — AB

## 2015-11-06 LAB — COMPREHENSIVE METABOLIC PANEL
ALK PHOS: 87 U/L (ref 38–126)
ALT: 28 U/L (ref 14–54)
ANION GAP: 9 (ref 5–15)
AST: 31 U/L (ref 15–41)
Albumin: 4 g/dL (ref 3.5–5.0)
BILIRUBIN TOTAL: 0.7 mg/dL (ref 0.3–1.2)
BUN: 25 mg/dL — ABNORMAL HIGH (ref 6–20)
CALCIUM: 9.8 mg/dL (ref 8.9–10.3)
CO2: 28 mmol/L (ref 22–32)
CREATININE: 1.12 mg/dL — AB (ref 0.44–1.00)
Chloride: 99 mmol/L — ABNORMAL LOW (ref 101–111)
GFR, EST AFRICAN AMERICAN: 52 mL/min — AB (ref >60)
GFR, EST NON AFRICAN AMERICAN: 45 mL/min — AB (ref >60)
Glucose, Bld: 189 mg/dL — ABNORMAL HIGH (ref 65–99)
Potassium: 4.2 mmol/L (ref 3.5–5.1)
Sodium: 136 mmol/L (ref 135–145)
TOTAL PROTEIN: 7.5 g/dL (ref 6.5–8.1)

## 2015-11-06 LAB — GASTROINTESTINAL PANEL BY PCR, STOOL (REPLACES STOOL CULTURE)
ASTROVIRUS: NOT DETECTED
Adenovirus F40/41: NOT DETECTED
CYCLOSPORA CAYETANENSIS: NOT DETECTED
Campylobacter species: NOT DETECTED
Cryptosporidium: NOT DETECTED
E. COLI O157: NOT DETECTED
ENTEROTOXIGENIC E COLI (ETEC): NOT DETECTED
Entamoeba histolytica: NOT DETECTED
Enteroaggregative E coli (EAEC): NOT DETECTED
Enteropathogenic E coli (EPEC): NOT DETECTED
Giardia lamblia: NOT DETECTED
Norovirus GI/GII: NOT DETECTED
Plesimonas shigelloides: NOT DETECTED
ROTAVIRUS A: NOT DETECTED
SAPOVIRUS (I, II, IV, AND V): NOT DETECTED
SHIGA LIKE TOXIN PRODUCING E COLI (STEC): NOT DETECTED
SHIGELLA/ENTEROINVASIVE E COLI (EIEC): NOT DETECTED
Salmonella species: NOT DETECTED
VIBRIO SPECIES: NOT DETECTED
Vibrio cholerae: NOT DETECTED
Yersinia enterocolitica: NOT DETECTED

## 2015-11-06 LAB — LIPASE, BLOOD: Lipase: 35 U/L (ref 11–51)

## 2015-11-06 LAB — TSH: TSH: 2.564 u[IU]/mL (ref 0.350–4.500)

## 2015-11-06 LAB — GLUCOSE, CAPILLARY
GLUCOSE-CAPILLARY: 143 mg/dL — AB (ref 65–99)
Glucose-Capillary: 134 mg/dL — ABNORMAL HIGH (ref 65–99)
Glucose-Capillary: 133 mg/dL — ABNORMAL HIGH (ref 65–99)
Glucose-Capillary: 121 mg/dL — ABNORMAL HIGH (ref 65–99)

## 2015-11-06 LAB — TROPONIN I: Troponin I: <0.03 ng/mL (ref <0.031)

## 2015-11-06 LAB — HEMOGLOBIN A1C: Hgb A1c MFr Bld: 6.9 % — ABNORMAL HIGH (ref 4.0–6.0)

## 2015-11-06 MED ORDER — BARIUM SULFATE 2.1 % PO SUSP
450.0000 mL | ORAL | Status: AC
  Administered 2015-11-06 (×2): 450 mL via ORAL

## 2015-11-06 MED ORDER — TRIAMTERENE-HCTZ 37.5-25 MG PO TABS
0.5000 | ORAL_TABLET | Freq: Every day | ORAL | Status: DC
  Administered 2015-11-06 – 2015-11-07 (×2): 0.5 via ORAL
  Filled 2015-11-06 (×2): qty 1

## 2015-11-06 MED ORDER — ACETAMINOPHEN 650 MG RE SUPP: 650.0000 mg | Freq: Four times a day (QID) | RECTAL | Status: DC | PRN

## 2015-11-06 MED ORDER — METOPROLOL SUCCINATE ER 50 MG PO TB24: 50.0000 mg | ORAL_TABLET | Freq: Every day | ORAL | Status: DC

## 2015-11-06 MED ORDER — METOPROLOL SUCCINATE ER 50 MG PO TB24
50.0000 mg | ORAL_TABLET | Freq: Every day | ORAL | Status: DC
  Administered 2015-11-06: 50 mg via ORAL
  Filled 2015-11-06: qty 1

## 2015-11-06 MED ORDER — INSULIN ASPART 100 UNIT/ML ~~LOC~~ SOLN: 0.0000 [IU] | Freq: Every day | SUBCUTANEOUS | Status: DC

## 2015-11-06 MED ORDER — SODIUM CHLORIDE 0.9% FLUSH
3.0000 mL | Freq: Two times a day (BID) | INTRAVENOUS | Status: DC
  Administered 2015-11-06 (×2): 3 mL via INTRAVENOUS

## 2015-11-06 MED ORDER — VANCOMYCIN 50 MG/ML ORAL SOLUTION
125.0000 mg | Freq: Four times a day (QID) | ORAL | Status: DC
  Administered 2015-11-06 (×4): 125 mg via ORAL
  Filled 2015-11-06 (×8): qty 2.5

## 2015-11-06 MED ORDER — SODIUM CHLORIDE 0.9 % IV SOLN
INTRAVENOUS | Status: DC
  Administered 2015-11-06 – 2015-11-07 (×3): via INTRAVENOUS

## 2015-11-06 MED ORDER — ACETAMINOPHEN 500 MG PO TABS
1000.0000 mg | ORAL_TABLET | Freq: Once | ORAL | Status: AC
  Administered 2015-11-06: 1000 mg via ORAL
  Filled 2015-11-06: qty 2

## 2015-11-06 MED ORDER — ENOXAPARIN SODIUM 40 MG/0.4ML ~~LOC~~ SOLN
40.0000 mg | SUBCUTANEOUS | Status: DC
  Administered 2015-11-06 – 2015-11-07 (×2): 40 mg via SUBCUTANEOUS
  Filled 2015-11-06 (×2): qty 0.4

## 2015-11-06 MED ORDER — LEVOTHYROXINE SODIUM 75 MCG PO TABS
75.0000 ug | ORAL_TABLET | Freq: Every day | ORAL | Status: DC
  Administered 2015-11-06 – 2015-11-07 (×2): 75 ug via ORAL
  Filled 2015-11-06 (×2): qty 1

## 2015-11-06 MED ORDER — ONDANSETRON HCL 4 MG PO TABS: 4.0000 mg | ORAL_TABLET | Freq: Four times a day (QID) | ORAL | Status: DC | PRN

## 2015-11-06 MED ORDER — SIMVASTATIN 40 MG PO TABS: 20.0000 mg | ORAL_TABLET | Freq: Every day | ORAL | Status: DC

## 2015-11-06 MED ORDER — LOSARTAN POTASSIUM 50 MG PO TABS
50.0000 mg | ORAL_TABLET | Freq: Every day | ORAL | Status: DC
  Administered 2015-11-06 – 2015-11-07 (×2): 50 mg via ORAL
  Filled 2015-11-06 (×2): qty 1

## 2015-11-06 MED ORDER — DULOXETINE HCL 20 MG PO CPEP
20.0000 mg | ORAL_CAPSULE | Freq: Every day | ORAL | Status: DC
Start: 2015-11-06 — End: 2015-11-07
  Administered 2015-11-06 – 2015-11-07 (×2): 20 mg via ORAL
  Filled 2015-11-06 (×2): qty 1

## 2015-11-06 MED ORDER — ONDANSETRON HCL 4 MG/2ML IJ SOLN
4.0000 mg | Freq: Once | INTRAMUSCULAR | Status: DC
  Filled 2015-11-06: qty 2

## 2015-11-06 MED ORDER — MOMETASONE FURO-FORMOTEROL FUM 100-5 MCG/ACT IN AERO
2.0000 | INHALATION_SPRAY | Freq: Two times a day (BID) | RESPIRATORY_TRACT | Status: DC
  Administered 2015-11-06 – 2015-11-07 (×3): 2 via RESPIRATORY_TRACT
  Filled 2015-11-06: qty 8.8

## 2015-11-06 MED ORDER — METOPROLOL TARTRATE 50 MG PO TABS: 50.0000 mg | ORAL_TABLET | Freq: Every day | ORAL | Status: DC

## 2015-11-06 MED ORDER — TRIAMCINOLONE ACETONIDE 0.1 % EX OINT
TOPICAL_OINTMENT | Freq: Two times a day (BID) | CUTANEOUS | Status: DC
  Administered 2015-11-06 – 2015-11-07 (×3): via TOPICAL
  Filled 2015-11-06: qty 15

## 2015-11-06 MED ORDER — ACETAMINOPHEN 325 MG PO TABS
650.0000 mg | ORAL_TABLET | Freq: Four times a day (QID) | ORAL | Status: DC | PRN
  Administered 2015-11-06 – 2015-11-07 (×2): 650 mg via ORAL
  Filled 2015-11-06 (×2): qty 2

## 2015-11-06 MED ORDER — DILTIAZEM HCL ER COATED BEADS 180 MG PO CP24
300.0000 mg | ORAL_CAPSULE | Freq: Every day | ORAL | Status: DC
  Administered 2015-11-06 – 2015-11-07 (×2): 300 mg via ORAL
  Filled 2015-11-06: qty 1

## 2015-11-06 MED ORDER — PANTOPRAZOLE SODIUM 40 MG PO TBEC
40.0000 mg | DELAYED_RELEASE_TABLET | Freq: Every day | ORAL | Status: DC
  Administered 2015-11-06: 40 mg via ORAL
  Filled 2015-11-06: qty 1

## 2015-11-06 MED ORDER — CALCIUM CARBONATE ANTACID 500 MG PO CHEW
1.0000 | CHEWABLE_TABLET | Freq: Every day | ORAL | Status: DC
  Administered 2015-11-06 – 2015-11-07 (×2): 200 mg via ORAL
  Filled 2015-11-06 (×2): qty 1

## 2015-11-06 MED ORDER — PRAVASTATIN SODIUM 20 MG PO TABS
40.0000 mg | ORAL_TABLET | Freq: Every day | ORAL | Status: DC
  Administered 2015-11-06: 40 mg via ORAL
  Filled 2015-11-06: qty 2

## 2015-11-06 MED ORDER — ONDANSETRON HCL 4 MG/2ML IJ SOLN: 4.0000 mg | Freq: Four times a day (QID) | INTRAMUSCULAR | Status: DC | PRN

## 2015-11-06 MED ORDER — DILTIAZEM HCL ER BEADS 300 MG PO CP24
300.0000 mg | ORAL_CAPSULE | Freq: Every day | ORAL | Status: DC
  Filled 2015-11-06: qty 1

## 2015-11-06 MED ORDER — CO Q 10 10 MG PO CAPS: 1.0000 | ORAL_CAPSULE | Freq: Every day | ORAL | Status: DC

## 2015-11-06 MED ORDER — INSULIN ASPART 100 UNIT/ML ~~LOC~~ SOLN
0.0000 [IU] | Freq: Three times a day (TID) | SUBCUTANEOUS | Status: DC
  Administered 2015-11-06: 1 [IU] via SUBCUTANEOUS
  Filled 2015-11-06: qty 1

## 2015-11-06 NOTE — Consult Note (Signed)
[facility] Consultation      Assessment and Plan:  82 yo obese female with history of OSA now admitted with c.dif colitis complicated by acute respiratory failure.   Emphysema/COPD. -On review of most recent CT of the chest from August 2012, this would appear to be advanced. --Will start advair (or equivalent) inhaler to be used daily.  --Wean down oxygen as tolerated; it was explained to her that she might be discharged on oxygen.  --Will need outpatient PFT when stable.   Restrictive lung disease-likely. -Lung volumes appear to be reduced on most recent CT abdomen images, this is likely due to body habitus.  Acute on chronic hypoxic respiratory failure. -Patient has chronic respiratory failure, likely multifactorial secondary to above. -May be an element of acute respiratory failure secondary to acute C. difficile colitis.  Obstructive sleep apnea. --Explained that she will need to be on cpap, will start her on a low pressure here to help acclimatize her to it.  --She will likely require another sleep study depending on her insurance in order to start her on CPAP outpatient. We will address this further when she is seen outpatient.   Morbid Obesity.  --BMI=45; This is contributing to her dyspnea/respiratory failure, explained that she will need to lose weight.     Date: 11/06/2015  MRN# [id number] Kelsey Young 03/01/1932  Referring Physician: Dr. Verdell Carmine.   Kelsey Young is a 82 y.o. old female seen in consultation for chief complaint of:    Chief Complaint  Patient presents with  . Emesis    HPI:   82 year old female who presents for nausea, vomiting and diarrhea with near syncopal episode while on the toilet.The patient has a history of diabetes mellitus, hypertension, sleep apnea, intrathoracic goiter. The patient is not currently on CPAP. She had a CT of the abdomen and pelvis, which showed diverticulosis and incidental left adrenal nodule.  The patient's C. difficile came back as positive. She has since been admitted to the hospital and started on treatment for C. difficile colitis.  It was also noted that the patient had tachypnea and dyspnea, her oxygen saturation dropped when ambulated from commode to bed to 85% on room air. It was also noted that the patient's oxygen saturation dropped into the 80s as she drifts off to sleep.  Review of chest x-ray images from 11/06/15 showed bibasilar atelectasis, with mild hyperinflation. Review of the CT of the lower lungs seen on the cuts of the CT abdomen from 11/06/15 shows emphysematous changes in both lung bases. There is also increased AP diameter consistent with hyperinflation. Mild diaphragmatic eventration seen in the right diaphragm. Lung volumes also appeared to be reduced secondary to obesity. Overall, this appears to be consistent with both obstructive and her sugar lung disease from COPD and body habitus. Review of CT lung screening images 02/21/11: Diffuse changes consistent with advanced emphysema, greatest in the apices. Mild if her medical infiltration on the right was not significantly different back then.   PMHX:   Past Medical History  Diagnosis Date  . Diabetes mellitus without complication ([place])   . Hypertension   . Sleep apnea   . Hyperlipidemia    Surgical Hx:  Past Surgical History  Procedure Laterality Date  . Cholecystectomy     Family Hx:  Family History  Problem Relation Age of Onset  . Hypertension Other    Social Hx:   Social History  Substance Use Topics  . Smoking status: Former Research scientist (life sciences)  .  Smokeless tobacco: Never Used  . Alcohol Use: 0.6 - 1.2 oz/week    1-2 Standard drinks or equivalent per week   Medication:   No current outpatient prescriptions on file.    Allergies:  Contrast media; Sulfur; Codeine; Morphine; Oxycodone; Penicillins; and Sulfa antibiotics  Review of Systems: Gen:  Denies  fever, sweats, chills HEENT: Denies blurred  vision, double vision.  Cvc:  No dizziness, chest pain. Resp:   Denies cough or sputum production,  Gi: Denies swallowing difficulty,  Gu:  Denies bladder incontinence, burning urine Ext:   No Joint pain, stiffness. Skin: No skin rash,  hives  Endoc:  No polyuria, polydipsia. Psych: No depression, insomnia. Other:  All other systems were reviewed with the patient and were negative other that what is mentioned in the HPI.   Physical Examination:   VS: BP 133/63 mmHg  Pulse 101  Temp(Src) 98.2 F (36.8 C) (Oral)  Resp 20  Ht 4\' 11"  (1.499 m)  Wt 221 lb (100.245 kg)  BMI 44.61 kg/m2  SpO2 95%  General Appearance: No distress  Neuro:without focal findings,  speech normal,  HEENT: PERRLA, EOM intact.   Pulmonary: normal breath sounds, No wheezing.  CardiovascularNormal S1,S2.  No m/r/g.   Abdomen: Benign, Soft, non-tender. Renal:  No costovertebral tenderness  GU:  No performed at this time. Endoc: No evident thyromegaly, no signs of acromegaly. Skin:   warm, no rashes, no ecchymosis  Extremities: normal, no cyanosis, clubbing.  Other findings:    LABORATORY PANEL:   CBC  Recent Labs Lab 11/05/15 2332  WBC 18.5*  HGB 14.7  HCT 44.1  PLT 314   ------------------------------------------------------------------------------------------------------------------  Chemistries   Recent Labs Lab 11/05/15 2332  NA 136  K 4.2  CL 99*  CO2 28  GLUCOSE 189*  BUN 25*  CREATININE 1.12*  CALCIUM 9.8  AST 31  ALT 28  ALKPHOS 87  BILITOT 0.7   ------------------------------------------------------------------------------------------------------------------  Cardiac Enzymes  Recent Labs Lab 11/05/15 2332  TROPONINI <0.03   ------------------------------------------------------------  RADIOLOGY:  Ct Abdomen Pelvis Wo Contrast  11/06/2015  CLINICAL DATA:  Nausea vomiting and diaphoresis today. Syncopal episode. EXAM: CT ABDOMEN AND PELVIS WITHOUT CONTRAST  TECHNIQUE: Multidetector CT imaging of the abdomen and pelvis was performed following the standard protocol without IV contrast. COMPARISON:  None. FINDINGS: There are unremarkable unenhanced appearances of the liver, bile ducts, pancreas, spleen, right adrenal and kidneys. There is a 2 cm indeterminate left adrenal nodule. The abdominal aorta is normal in caliber and heavily calcified. Bowel is remarkable only for uncomplicated diverticulosis. There is hysterectomy. No adnexal abnormality is evident. There is cholecystectomy. No acute inflammatory changes are evident in the abdomen or pelvis. There is no adenopathy. There is no ascites. There is no significant skeletal lesion. There is no significant abnormality in the lower chest. IMPRESSION: No acute findings are evident in the abdomen or pelvis. Diverticulosis 2 cm left adrenal nodule, more likely benign based on its low-attenuation but not conclusively characterized. Consider a follow-up CT or MRI in 12 months. Electronically Signed   By: Andreas Newport M.D.   On: 11/06/2015 03:34   Dg Chest Port 1 View  11/06/2015  CLINICAL DATA:  82 year old female with nausea vomiting and diarrhea. Patient had syncopal episode. EXAM: PORTABLE CHEST 1 VIEW COMPARISON:  Chest CT dated 02/21/2011 and radiograph dated 02/21/2011 stop FINDINGS: Single-view of the chest does not demonstrate a focal consolidation. There is no pleural effusion or pneumothorax. There is mild emphysematous changes of  the lungs. Top-normal cardiac size. There is mild deviation of the trachea to the left of the midline, likely caused by upper mediastinal/thyroid lesion seen on the prior CT. The osseous structures are grossly unremarkable. IMPRESSION: No active disease. Electronically Signed   By: Anner Crete M.D.   On: 11/06/2015 01:38       Thank  you for the consultation and for allowing [facility] to assist in the care of your patient. Our recommendations  are noted above.  Please contact us if we can be of further service.   Marda Stalker, MD.  Board Certified in Internal Medicine, Pulmonary Medicine, [place], and Sleep Medicine.  [facility] Office Number: [phone]  Patricia Pesa, M.D.  Vilinda Boehringer, M.D.  Merton Border, M.D  11/06/2015

## 2015-11-06 NOTE — H&P (Signed)
Kelsey Young is an 82 y.o. female.   Chief Complaint: Nausea and diarrhea HPI: The patient with past medical history of diabetes and hypertension presents emergency department complaining of nausea and diarrhea. She states she's had multiple loose stools tonight. They have been nonbloody. Her abdomen is nonpainful. She is able to eat but has decreased appetite secondary to nausea. In the emergency department she feels somewhat better after a dose of anti-emetic. Enteric PCR resulted negative but C. difficile stool sample eventually resulted positive. Also of note, the patient was found to be hypoxic to 85 with exertion and at rest which prompted the emergency department staff to call for admission.  Past Medical History  Diagnosis Date  . Diabetes mellitus without complication ([place])   . Hypertension   . Sleep apnea   . Hyperlipidemia     Past Surgical History  Procedure Laterality Date  . Cholecystectomy      Family History  Problem Relation Age of Onset  . Hypertension Other    Social History:  reports that she has quit smoking. She has never used smokeless tobacco. She reports that she drinks about 0.6 - 1.2 oz of alcohol per week. She reports that she does not use illicit drugs.  Allergies:  Allergies  Allergen Reactions  . Contrast Media [Iodinated Diagnostic Agents] Hives and Shortness Of Breath    Restless   . Sulfur Hives and Shortness Of Breath  . Codeine Nausea And Vomiting  . Morphine Nausea And Vomiting  . Oxycodone Nausea And Vomiting    dizzy  . Penicillins Other (See Comments)    "Hardened lump" Has patient had a PCN reaction causing immediate rash, facial/tongue/throat swelling, SOB or lightheadedness with hypotension: No Has patient had a PCN reaction causing severe rash involving mucus membranes or skin necrosis: No Has patient had a PCN reaction that required hospitalization No Has patient had a PCN reaction occurring within the last 10 years: No If all of  the above answers are "NO", then may proceed with Cephalosporin use.    . Sulfa Antibiotics Rash    Prior to Admission medications   Medication Sig Start Date End Date Taking? Authorizing Provider  Coenzyme Q10 (CO Q 10 PO) Take 1 tablet by mouth daily.   Yes Historical Provider, MD  diltiazem (TIAZAC) 300 MG 24 hr capsule Take 300 mg by mouth daily.   Yes Historical Provider, MD  levothyroxine (SYNTHROID, LEVOTHROID) 75 MCG tablet Take 75 mcg by mouth daily before breakfast.   Yes Historical Provider, MD  losartan (COZAAR) 50 MG tablet Take 50 mg by mouth daily.   Yes Historical Provider, MD  metFORMIN (GLUCOPHAGE) 500 MG tablet Take 500 mg by mouth at bedtime.   Yes Historical Provider, MD  metoprolol (LOPRESSOR) 50 MG tablet Take 50 mg by mouth at bedtime.   Yes Historical Provider, MD  Multiple Vitamins-Minerals (PRESERVISION AREDS PO) Take 1 tablet by mouth daily.   Yes Historical Provider, MD  naproxen sodium (ANAPROX) 220 MG tablet Take 220 mg by mouth 2 (two) times daily as needed (back pain).   Yes Historical Provider, MD  omeprazole (PRILOSEC) 20 MG capsule Take 20 mg by mouth daily.   Yes Historical Provider, MD  pravastatin (PRAVACHOL) 40 MG tablet Take 40 mg by mouth at bedtime.  10/30/15  Yes Historical Provider, MD  sitaGLIPtin (JANUVIA) 50 MG tablet Take 50 mg by mouth daily.   Yes Historical Provider, MD  triamterene-hydrochlorothiazide (MAXZIDE-25) 37.5-25 MG per tablet Take 0.5 tablets  by mouth daily.   Yes Historical Provider, MD     Results for orders placed or performed during the hospital encounter of 11/05/15 (from the past 48 hour(s))  Lipase, blood     Status: None   Collection Time: 11/05/15 11:32 PM  Result Value Ref Range   Lipase 35 11 - 51 U/L  Comprehensive metabolic panel     Status: Abnormal   Collection Time: 11/05/15 11:32 PM  Result Value Ref Range   Sodium 136 135 - 145 mmol/L   Potassium 4.2 3.5 - 5.1 mmol/L   Chloride 99 (L) 101 - 111 mmol/L    CO2 28 22 - 32 mmol/L   Glucose, Bld 189 (H) 65 - 99 mg/dL   BUN 25 (H) 6 - 20 mg/dL   Creatinine, Ser 1.12 (H) 0.44 - 1.00 mg/dL   Calcium 9.8 8.9 - 10.3 mg/dL   Total Protein 7.5 6.5 - 8.1 g/dL   Albumin 4.0 3.5 - 5.0 g/dL   AST 31 15 - 41 U/L   ALT 28 14 - 54 U/L   Alkaline Phosphatase 87 38 - 126 U/L   Total Bilirubin 0.7 0.3 - 1.2 mg/dL   GFR calc non Af Amer 45 (L) >60 mL/min   GFR calc Af Amer 52 (L) >60 mL/min    Comment: (NOTE) The eGFR has been calculated using the CKD EPI equation. This calculation has not been validated in all clinical situations. eGFR's persistently <60 mL/min signify possible Chronic Kidney Disease.    Anion gap 9 5 - 15  CBC     Status: Abnormal   Collection Time: 11/05/15 11:32 PM  Result Value Ref Range   WBC 18.5 (H) 3.6 - 11.0 K/uL   RBC 4.97 3.80 - 5.20 MIL/uL   Hemoglobin 14.7 12.0 - 16.0 g/dL   HCT 44.1 35.0 - 47.0 %   MCV 88.7 80.0 - 100.0 fL   MCH 29.6 26.0 - 34.0 pg   MCHC 33.3 32.0 - 36.0 g/dL   RDW 13.4 11.5 - 14.5 %   Platelets 314 150 - 440 K/uL  Troponin I     Status: None   Collection Time: 11/05/15 11:32 PM  Result Value Ref Range   Troponin I <0.03 <0.031 ng/mL    Comment:        NO INDICATION OF MYOCARDIAL INJURY.   C difficile quick scan w PCR reflex     Status: Abnormal   Collection Time: 11/06/15  1:38 AM  Result Value Ref Range   C Diff antigen POSITIVE (A) NEGATIVE    Comment: CRITICAL RESULT CALLED TO, READ BACK BY AND VERIFIED WITH: ANDREA BRYANT ON 11/06/15 AT 0455 BY TLB    C Diff toxin NEGATIVE NEGATIVE   C Diff interpretation      Positive for toxigenic C. difficile, active toxin production not detected. Patient has toxigenic C. difficile organisms present in the bowel, but toxin was not detected. The patient may be a carrier or the level of toxin in the sample was below the limit  of detection. This information should be used in conjunction with the patient's clinical history when deciding on possible  therapy.   Gastrointestinal Panel by PCR , Stool     Status: None   Collection Time: 11/06/15  1:38 AM  Result Value Ref Range   Campylobacter species NOT DETECTED NOT DETECTED   Plesimonas shigelloides NOT DETECTED NOT DETECTED   Salmonella species NOT DETECTED NOT DETECTED   Yersinia enterocolitica NOT  DETECTED NOT DETECTED   Vibrio species NOT DETECTED NOT DETECTED   Vibrio cholerae NOT DETECTED NOT DETECTED   Enteroaggregative E coli (EAEC) NOT DETECTED NOT DETECTED   Enteropathogenic E coli (EPEC) NOT DETECTED NOT DETECTED   Enterotoxigenic E coli (ETEC) NOT DETECTED NOT DETECTED   Shiga like toxin producing E coli (STEC) NOT DETECTED NOT DETECTED   E. coli O157 NOT DETECTED NOT DETECTED   Shigella/Enteroinvasive E coli (EIEC) NOT DETECTED NOT DETECTED   Cryptosporidium NOT DETECTED NOT DETECTED   Cyclospora cayetanensis NOT DETECTED NOT DETECTED   Entamoeba histolytica NOT DETECTED NOT DETECTED   Giardia lamblia NOT DETECTED NOT DETECTED   Adenovirus F40/41 NOT DETECTED NOT DETECTED   Astrovirus NOT DETECTED NOT DETECTED   Norovirus GI/GII NOT DETECTED NOT DETECTED   Rotavirus A NOT DETECTED NOT DETECTED   Sapovirus (I, II, IV, and V) NOT DETECTED NOT DETECTED  Clostridium Difficile by PCR     Status: Abnormal   Collection Time: 11/06/15  1:38 AM  Result Value Ref Range   Toxigenic C Difficile by pcr POSITIVE (A) NEGATIVE    Comment: CRITICAL RESULT CALLED TO, READ BACK BY AND VERIFIED WITH: ANDREA BRYANT ON 11/06/15 AT 0455 BY TLB    Ct Abdomen Pelvis Wo Contrast  11/06/2015  CLINICAL DATA:  Nausea vomiting and diaphoresis today. Syncopal episode. EXAM: CT ABDOMEN AND PELVIS WITHOUT CONTRAST TECHNIQUE: Multidetector CT imaging of the abdomen and pelvis was performed following the standard protocol without IV contrast. COMPARISON:  None. FINDINGS: There are unremarkable unenhanced appearances of the liver, bile ducts, pancreas, spleen, right adrenal and kidneys. There is a 2  cm indeterminate left adrenal nodule. The abdominal aorta is normal in caliber and heavily calcified. Bowel is remarkable only for uncomplicated diverticulosis. There is hysterectomy. No adnexal abnormality is evident. There is cholecystectomy. No acute inflammatory changes are evident in the abdomen or pelvis. There is no adenopathy. There is no ascites. There is no significant skeletal lesion. There is no significant abnormality in the lower chest. IMPRESSION: No acute findings are evident in the abdomen or pelvis. Diverticulosis 2 cm left adrenal nodule, more likely benign based on its low-attenuation but not conclusively characterized. Consider a follow-up CT or MRI in 12 months. Electronically Signed   By: Andreas Newport M.D.   On: 11/06/2015 03:34   Dg Chest Port 1 View  11/06/2015  CLINICAL DATA:  82 year old female with nausea vomiting and diarrhea. Patient had syncopal episode. EXAM: PORTABLE CHEST 1 VIEW COMPARISON:  Chest CT dated 02/21/2011 and radiograph dated 02/21/2011 stop FINDINGS: Single-view of the chest does not demonstrate a focal consolidation. There is no pleural effusion or pneumothorax. There is mild emphysematous changes of the lungs. Top-normal cardiac size. There is mild deviation of the trachea to the left of the midline, likely caused by upper mediastinal/thyroid lesion seen on the prior CT. The osseous structures are grossly unremarkable. IMPRESSION: No active disease. Electronically Signed   By: Anner Crete M.D.   On: 11/06/2015 01:38    Review of Systems  Constitutional: Negative for fever and chills.  HENT: Negative for sore throat and tinnitus.   Eyes: Negative for blurred vision and redness.  Respiratory: Positive for shortness of breath (dyspnea on exertion). Negative for cough.   Cardiovascular: Negative for chest pain, palpitations, orthopnea and PND.  Gastrointestinal: Positive for diarrhea. Negative for nausea, vomiting and abdominal pain.  Genitourinary:  Negative for dysuria, urgency and frequency.  Musculoskeletal: Negative for myalgias and joint pain.  Skin: Negative for rash.       No lesions  Neurological: Negative for speech change, focal weakness and weakness.  Endo/Heme/Allergies: Does not bruise/bleed easily.       No temperature intolerance  Psychiatric/Behavioral: Negative for depression and suicidal ideas.    Blood pressure 129/67, pulse 103, temperature 97.6 F (36.4 C), temperature source Oral, resp. rate 16, height 4' 11"  (1.499 m), weight 95.255 kg (210 lb), SpO2 94 %. Physical Exam  Vitals reviewed. Constitutional: She is oriented to person, place, and time. She appears well-developed and well-nourished.  HENT:  Head: Normocephalic and atraumatic.  Mouth/Throat: Oropharynx is clear and moist.  Eyes: Conjunctivae and EOM are normal. Pupils are equal, round, and reactive to light. No scleral icterus.  Neck: Normal range of motion. Neck supple. No JVD present. No tracheal deviation present. No thyromegaly present.  Cardiovascular: Normal rate, regular rhythm and normal heart sounds.  Exam reveals no gallop and no friction rub.   No murmur heard. Respiratory: Effort normal and breath sounds normal.  GI: Soft. Bowel sounds are normal. She exhibits no distension. There is no tenderness.  Genitourinary:  Deferred  Musculoskeletal: Normal range of motion. She exhibits no edema.  Lymphadenopathy:    She has no cervical adenopathy.  Neurological: She is alert and oriented to person, place, and time. No cranial nerve deficit. She exhibits normal muscle tone.  Skin: Skin is warm and dry. No rash noted. No erythema.  Psychiatric: She has a normal mood and affect. Her behavior is normal. Judgment and thought content normal.     Assessment/Plan This is an 81 year old female admitted for hypoxia. 1. Hypoxia: Multifactorial. The patient may have a component of COPD given her smoking history though she does not have significant  hyperinflation on chest x-ray. My interpretation of her chest film is that she may have some chronic interstitial disease as there is a faint reticular appearance to the background of the lung fields. Also, her cardiologist has recommended that she start CPAP for sleep apnea. I will review her previous echo results to confirm if there is also some pulmonary hypertension that may contribute to some right-sided heart failure. Consider consulting pulmonology. 2. Essential hypertension: Controlled; continue diltiazem, losartan, metoprolol and triamterene with hydrochlorothiazide. 3. Diabetes mellitus type 2: Hold oral hypoglycemics. I place the patient on sliding scale insulin hospitalized. 4. Hyperlipidemia: Continue statin therapy 5. Hypothyroidism: Status post partial thyroidectomy. Continue Synthroid. Check TSH 6. GERD: Continue pantoprazole per home regimen 7. Obstructive sleep apnea: The patient is undergone 3 sleep studies all of which confirmed that she needs to her CPAP. She chooses not to; this may be contributing to her overall fatigue and possible right-sided heart failure. 8. DVT prophylaxis: Lovenox 9. GI prophylaxis: As above The patient is a full code. Time spent on admission was inpatient care approximately 45 minutes  Harrie Foreman, MD 11/06/2015, 5:08 AM

## 2015-11-06 NOTE — Progress Notes (Signed)
[facility] NAME: Kelsey Young    MR#:  [id number]  DATE OF BIRTH:  1932/03/23  SUBJECTIVE:   Patient is here due to shortness of breath, diarrhea. Stool PCR positive for C. difficile. Still having some diarrhea this a.m.  REVIEW OF SYSTEMS:    Review of Systems  Constitutional: Negative for fever and chills.  HENT: Negative for congestion and tinnitus.   Eyes: Negative for blurred vision and double vision.  Respiratory: Positive for shortness of breath. Negative for cough and wheezing.   Cardiovascular: Negative for chest pain, orthopnea and PND.  Gastrointestinal: Positive for diarrhea. Negative for nausea, vomiting and abdominal pain.  Genitourinary: Negative for dysuria and hematuria.  Neurological: Negative for dizziness, sensory change and focal weakness.  All other systems reviewed and are negative.   Nutrition: Clear liquid Tolerating Diet: Yes Tolerating PT: Await eval.    DRUG ALLERGIES:   Allergies  Allergen Reactions  . Contrast Media [Iodinated Diagnostic Agents] Hives and Shortness Of Breath    Restless   . Sulfur Hives and Shortness Of Breath  . Codeine Nausea And Vomiting  . Morphine Nausea And Vomiting  . Oxycodone Nausea And Vomiting    dizzy  . Penicillins Other (See Comments)    "Hardened lump" Has patient had a PCN reaction causing immediate rash, facial/tongue/throat swelling, SOB or lightheadedness with hypotension: No Has patient had a PCN reaction causing severe rash involving mucus membranes or skin necrosis: No Has patient had a PCN reaction that required hospitalization No Has patient had a PCN reaction occurring within the last 10 years: No If all of the above answers are "NO", then may proceed with Cephalosporin use.    . Sulfa Antibiotics Rash    VITALS:  Blood pressure 133/63, pulse 101, temperature 98.2 F (36.8 C), temperature source Oral, resp. rate 20, height 4\' 11"  (1.499 m),  weight 100.245 kg (221 lb), SpO2 95 %.  PHYSICAL EXAMINATION:   Physical Exam  GENERAL:  82 y.o.-year-old patient lying in the bed in no acute distress.  EYES: Pupils equal, round, reactive to light and accommodation. No scleral icterus. Extraocular muscles intact.  HEENT: Head atraumatic, normocephalic. Oropharynx and nasopharynx clear.  NECK:  Supple, no jugular venous distention. No thyroid enlargement, no tenderness.  LUNGS: Prolonged insp. & exp. phase, no wheezing, rales, rhonchi. No use of accessory muscles of respiration.  CARDIOVASCULAR: S1, S2 normal. No murmurs, rubs, or gallops.  ABDOMEN: Soft, nontender, nondistended. Bowel sounds present. No organomegaly or mass.  EXTREMITIES: No cyanosis, clubbing or edema b/l.    NEUROLOGIC: Cranial nerves II through XII are intact. No focal Motor or sensory deficits b/l.  Globally weak PSYCHIATRIC: The patient is alert and oriented x 3.  SKIN: No obvious rash, lesion, or ulcer.    LABORATORY PANEL:   CBC  Recent Labs Lab 11/05/15 2332  WBC 18.5*  HGB 14.7  HCT 44.1  PLT 314   ------------------------------------------------------------------------------------------------------------------  Chemistries   Recent Labs Lab 11/05/15 2332  NA 136  K 4.2  CL 99*  CO2 28  GLUCOSE 189*  BUN 25*  CREATININE 1.12*  CALCIUM 9.8  AST 31  ALT 28  ALKPHOS 87  BILITOT 0.7   ------------------------------------------------------------------------------------------------------------------  Cardiac Enzymes  Recent Labs Lab 11/05/15 2332  TROPONINI <0.03   ------------------------------------------------------------------------------------------------------------------  RADIOLOGY:  Ct Abdomen Pelvis Wo Contrast  11/06/2015  CLINICAL DATA:  Nausea vomiting and diaphoresis today. Syncopal episode. EXAM: CT ABDOMEN AND PELVIS  WITHOUT CONTRAST TECHNIQUE: Multidetector CT imaging of the abdomen and pelvis was performed  following the standard protocol without IV contrast. COMPARISON:  None. FINDINGS: There are unremarkable unenhanced appearances of the liver, bile ducts, pancreas, spleen, right adrenal and kidneys. There is a 2 cm indeterminate left adrenal nodule. The abdominal aorta is normal in caliber and heavily calcified. Bowel is remarkable only for uncomplicated diverticulosis. There is hysterectomy. No adnexal abnormality is evident. There is cholecystectomy. No acute inflammatory changes are evident in the abdomen or pelvis. There is no adenopathy. There is no ascites. There is no significant skeletal lesion. There is no significant abnormality in the lower chest. IMPRESSION: No acute findings are evident in the abdomen or pelvis. Diverticulosis 2 cm left adrenal nodule, more likely benign based on its low-attenuation but not conclusively characterized. Consider a follow-up CT or MRI in 12 months. Electronically Signed   By: Andreas Newport M.D.   On: 11/06/2015 03:34   Dg Chest Port 1 View  11/06/2015  CLINICAL DATA:  81 year old female with nausea vomiting and diarrhea. Patient had syncopal episode. EXAM: PORTABLE CHEST 1 VIEW COMPARISON:  Chest CT dated 02/21/2011 and radiograph dated 02/21/2011 stop FINDINGS: Single-view of the chest does not demonstrate a focal consolidation. There is no pleural effusion or pneumothorax. There is mild emphysematous changes of the lungs. Top-normal cardiac size. There is mild deviation of the trachea to the left of the midline, likely caused by upper mediastinal/thyroid lesion seen on the prior CT. The osseous structures are grossly unremarkable. IMPRESSION: No active disease. Electronically Signed   By: Anner Crete M.D.   On: 11/06/2015 01:38     ASSESSMENT AND PLAN:   82 year old female with past history of diabetes, hypertension, obstructive sleep apnea, hyperlipidemia who presents to the hospital due to shortness of breath and diarrhea.  1. C. difficile  diarrhea-patient's PCR is positive for C. difficile. She has not been on any recent antibiotics. -Continue contact precautions. Started on oral vancomycin. -Place on clear liquid diet and follow clinically.  2. Shortness of breath-chronic in nature. Likely due to underlying COPD/obstructive sleep apnea. -No evidence of acute COPD exacerbation. -No evidence of acute CHF. Continue O2 supplementation.  3. Diabetes type 2 without complication-continue sliding scale insulin.  4. Essential hypertension-continue Cardizem, losartan, metoprolol, triamterene/HCTZ.  5. Leukocytosis-secondary to the C. difficile colitis/diarrhea. Follow white cell count.  6. Hypothyroidism-continue Synthroid.  7. GERD-continue Protonix.  8. Hyperlipidemia-continue Pravachol.   All the records are reviewed and case discussed with Care Management/Social Workerr. Management plans discussed with the patient, family and they are in agreement.  CODE STATUS: Full  DVT Prophylaxis: Lovenox  TOTAL TIME TAKING CARE OF THIS PATIENT: 30 minutes.   POSSIBLE D/C IN 1-2 DAYS, DEPENDING ON CLINICAL CONDITION.   Henreitta Leber M.D on 11/06/2015 at 12:41 PM  Between 7am to 6pm - Pager - [phone]  After 6pm go to [email or web address] - password [email or web address] [facility]  [facility]  Office  [phone]  CC: Primary care physician; No PCP Per Patient

## 2015-11-06 NOTE — ED Notes (Addendum)
Helped pt up to bathroom after getting back from CT. Upon returning pt to stretcher pt very SOB, hooked pt back up to monitor and pt O2 sat 84-85% on RA. Pt placed back on 2L by [place] and after several minutes pt O2 sat back up to 95%. Dr. Beather Arbour notified.

## 2015-11-06 NOTE — Progress Notes (Signed)
Inpatient Diabetes Program Recommendations  AACE/ADA: New Consensus Statement on Inpatient Glycemic Control (2015)  Target Ranges:  Prepandial:   less than 140 mg/dL      Peak postprandial:   less than 180 mg/dL (1-2 hours)      Critically ill patients:  140 - 180 mg/dL  Results for RICHELL, MAROTZ (MRN [id number]) as of 11/06/2015 10:58  Ref. Range 11/06/2015 07:33  Glucose-Capillary Latest Ref Range: 65-99 mg/dL 134 (H)  Results for ATHLENE, RINGQUIST (MRN [id number]) as of 11/06/2015 10:58  Ref. Range 11/05/2015 23:32  Glucose Latest Ref Range: 65-99 mg/dL 189 (H)   Review of Glycemic Control  Diabetes history: DM2 Outpatient Diabetes medications: Metformin 500 mg QHS, Januvia 50 mg daily Current orders for Inpatient glycemic control: None  Inpatient Diabetes Program Recommendations: Correction (SSI): While inpatient please consider ordering CBGs with Novolog sensitive correction sale ACHS. HgbA1C: A1C ordered and in process.  Thanks, Barnie Alderman, RN, MSN, CDE Diabetes Coordinator Inpatient Diabetes Program [phone] (Team Pager from [place] to [place]) [phone] ([facility] office) [phone] [facility] office) [phone] [facility] office)

## 2015-11-06 NOTE — ED Notes (Signed)
Pt intermittently sleepy. When pt drifts off to sleep, O2 sats dropping into high 80s. Pt reports that she has been told in the past that she made need a CPAP but has not wanted to get one because she knows people who have them and dislike them because of how they affect their sleep. Pt placed on 2L by [place]. Now when pt is lightly sleeping O2 sats are maintaining in the mid 90s. Dr. Beather Arbour notified.

## 2015-11-06 NOTE — Progress Notes (Signed)
Spoke with pt about CPAP for sleep. Pt states that she is not planning on wearing the CPAP. Pt states that she may or may not have sleep study done when she leaves the hospital.  I encouraged pt to at least try the CPAP but she states that she's not interested. CPAP has not been setup in pt's room since she is refusing

## 2015-11-06 NOTE — Progress Notes (Signed)
11/06/2015  8:30  Pt is refusing her bed alarm because she is having frequent bowel movements.  Pt is alert and oriented and I explained the rationale of the bed alarm as well as the risks of refusing.  Pt stated understanding.  Dola Argyle, RN

## 2015-11-07 DIAGNOSIS — G4710 Hypersomnia, unspecified: Secondary | ICD-10-CM

## 2015-11-07 DIAGNOSIS — G4730 Sleep apnea, unspecified: Secondary | ICD-10-CM

## 2015-11-07 LAB — CBC
HEMATOCRIT: 34.8 % — AB (ref 35.0–47.0)
HEMOGLOBIN: 11.5 g/dL — AB (ref 12.0–16.0)
MCH: 30.2 pg (ref 26.0–34.0)
MCHC: 32.9 g/dL (ref 32.0–36.0)
MCV: 91.6 fL (ref 80.0–100.0)
Platelets: 236 10*3/uL (ref 150–440)
RBC: 3.8 MIL/uL (ref 3.80–5.20)
RDW: 13.4 % (ref 11.5–14.5)
WBC: 8.4 10*3/uL (ref 3.6–11.0)

## 2015-11-07 LAB — BASIC METABOLIC PANEL
Anion gap: 4 — ABNORMAL LOW (ref 5–15)
BUN: 13 mg/dL (ref 6–20)
CHLORIDE: 105 mmol/L (ref 101–111)
CO2: 28 mmol/L (ref 22–32)
Calcium: 8.2 mg/dL — ABNORMAL LOW (ref 8.9–10.3)
Creatinine, Ser: 0.77 mg/dL (ref 0.44–1.00)
GFR calc non Af Amer: >60 mL/min (ref >60)
Glucose, Bld: 108 mg/dL — ABNORMAL HIGH (ref 65–99)
POTASSIUM: 4.1 mmol/L (ref 3.5–5.1)
SODIUM: 137 mmol/L (ref 135–145)

## 2015-11-07 LAB — GLUCOSE, CAPILLARY
GLUCOSE-CAPILLARY: 118 mg/dL — AB (ref 65–99)
GLUCOSE-CAPILLARY: 112 mg/dL — AB (ref 65–99)

## 2015-11-07 MED ORDER — RANITIDINE HCL 150 MG PO TABS: 150.0000 mg | ORAL_TABLET | Freq: Two times a day (BID) | ORAL | Status: DC

## 2015-11-07 MED ORDER — VANCOMYCIN 50 MG/ML ORAL SOLUTION
125.0000 mg | Freq: Four times a day (QID) | ORAL | Status: DC
  Administered 2015-11-07 (×2): 125 mg via ORAL
  Filled 2015-11-07 (×4): qty 2.5

## 2015-11-07 MED ORDER — FLUTICASONE-SALMETEROL 250-50 MCG/DOSE IN AEPB
1.0000 | INHALATION_SPRAY | Freq: Two times a day (BID) | RESPIRATORY_TRACT | Status: DC
Start: 2015-11-07 — End: 2018-10-17

## 2015-11-07 MED ORDER — VANCOMYCIN HCL 125 MG PO CAPS: 125.0000 mg | ORAL_CAPSULE | Freq: Four times a day (QID) | ORAL | Status: AC

## 2015-11-07 NOTE — Progress Notes (Signed)
* [facility]     Assessment and Plan:  82 yo obese female with history of OSA now admitted with c.dif colitis complicated by acute respiratory failure.   Emphysema/COPD. -Advanced emphysema with chronic respiratory failure, does not appear to be in exacerbation.  --Continue dulera, can discharge with presciption for advair 250 bid.  --Wean down oxygen as tolerated;  she might be discharged on oxygen.  --Will need outpatient PFT when stable.   Restrictive lung disease-likely. -Lung volumes appear to be reduced on most recent CT abdomen images, this is likely due to body habitus.  Acute on chronic hypoxic respiratory failure. -Hypoxemia is multifactorial from COPD, morbid obesity, OSA.   Obstructive sleep apnea. --Pt is not interested in restarting CPAP again at this time, we can broach this subject again when seen outpatient.    Morbid Obesity.  --BMI=45; This is contributing to her dyspnea/respiratory failure, explained that she will need to lose weight.   Pt is stable for DC from respiratory standpoint, will sign off for now, please call with any questions or concerns. My office was given my card for follow up in next 3 to 4 weeks; my office will be contacting her  Date: 11/07/2015  MRN# [id number] Kelsey Young [birth year]   Kelsey Young is a 82 y.o. old female seen in follow up for chief complaint of  Chief Complaint  Patient presents with  . Emesis     HPI:   New new complaints today. Is feeling ok, continued diarrhea.   Allergies:  Contrast media; Sulfur; Codeine; Morphine; Oxycodone; Penicillins; and Sulfa antibiotics  Review of Systems: Gen:  Denies  fever, sweats. HEENT: Denies blurred vision. Cvc:  No dizziness, chest pain or heaviness Resp:   Denies cough or sputum porduction. Gi: Denies swallowing difficulty, stomach pain. constipation, bowel incontinence Gu:  Denies bladder incontinence, burning urine Ext:   No Joint pain,  stiffness. Skin: No skin rash, easy bruising. Endoc:  No polyuria, polydipsia. Psych: No depression, insomnia. Other:  All other systems were reviewed and found to be negative other than what is mentioned in the HPI.   Physical Examination:   VS: BP 161/57 mmHg  Pulse 84  Temp(Src) 98.5 F (36.9 C) (Oral)  Resp 18  Ht 4\' 11"  (1.499 m)  Wt 224 lb 1.6 oz (101.651 kg)  BMI 45.24 kg/m2  SpO2 94%  General Appearance: No distress  Neuro:without focal findings,  speech normal,  HEENT: PERRLA, EOM intact. Pulmonary: normal breath sounds, No wheezing.   CardiovascularNormal S1,S2.  No m/r/g.   Abdomen: Benign, Soft, non-tender. Renal:  No costovertebral tenderness  GU:  Not performed at this time. Endoc: No evident thyromegaly, no signs of acromegaly. Skin:   warm, no rash. Extremities: normal, no cyanosis, clubbing.   LABORATORY PANEL:   CBC  Recent Labs Lab 11/07/15 0513  WBC 8.4  HGB 11.5*  HCT 34.8*  PLT 236   ------------------------------------------------------------------------------------------------------------------  Chemistries   Recent Labs Lab 11/05/15 2332 11/07/15 0513  NA 136 137  K 4.2 4.1  CL 99* 105  CO2 28 28  GLUCOSE 189* 108*  BUN 25* 13  CREATININE 1.12* 0.77  CALCIUM 9.8 8.2*  AST 31  --   ALT 28  --   ALKPHOS 87  --   BILITOT 0.7  --    ------------------------------------------------------------------------------------------------------------------  Cardiac Enzymes  Recent Labs Lab 11/05/15 2332  TROPONINI <0.03   ------------------------------------------------------------  RADIOLOGY:   No results found for this or  any previous visit. Results for orders placed in visit on 02/21/11  DG Chest 2 View   Narrative * PRIOR REPORT IMPORTED FROM AN EXTERNAL SYSTEM *   PRIOR REPORT IMPORTED FROM THE SYNGO WORKFLOW SYSTEM   REASON FOR EXAM:    Breathing difficulty  COMMENTS:   LMP: Post Hysterectomy   PROCEDURE:     DXR -  DXR CHEST PA (OR AP) AND LATERAL  - Feb 21 2011   7:56AM   RESULT:     Comparison is made to a study of 13 January 2010.   The lungs are adequately inflated. There is an azygos lobe anatomy. The  cardiac silhouette is enlarged. The pulmonary vascularity is not engorged.  I  see no pleural effusion. The thoracic vertebral bodies are preserved in  height.   IMPRESSION:      I do not see evidence of pneumonia nor definite evidence  of  CHF. There is likely underlying COPD.       ------------------------------------------------------------------------------------------------------------------  Thank  you for allowing [facility] to assist in the care of your patient. Our recommendations are noted above.  Please contact us if we can be of further service.   Marda Stalker, MD.  [facility] Office Number: [phone]  Patricia Pesa, M.D.  Vilinda Boehringer, M.D.  Merton Border, M.D  11/07/2015

## 2015-11-07 NOTE — Care Management (Signed)
No RNCM consult. Patient admitted for hypoxia.  Patient lives at home alone.  Patient states that at baseline she is independent of all ADL's.  Drives.  Walker at home for ambulation if needed.  Patient used acute O2 while in hospital.  Patient did not qualify for home O2 at time of discharge.  Denies financial concerns.

## 2015-11-07 NOTE — Progress Notes (Signed)
IV and tele were removed. Discharge instructions, follow-up appointments, and prescriptions were provided to the pt. The pt is currently awaiting her son to arrive for D/C home.

## 2015-11-07 NOTE — Discharge Summary (Signed)
[facility] NAME: Machelle Cherian    MR#:  [id number]  DATE OF BIRTH:  1931/08/15  DATE OF ADMISSION:  11/05/2015 ADMITTING PHYSICIAN: Demetrios Loll, MD  DATE OF DISCHARGE: No discharge date for patient encounter.  PRIMARY CARE PHYSICIAN: Perrin Maltese, MD    ADMISSION DIAGNOSIS:  Weakness [R53.1] Hypoxia [R09.02] C. difficile colitis [A04.7] Nausea vomiting and diarrhea [R11.2, R19.7] Type 2 diabetes mellitus without complication, unspecified long term insulin use status ([place]) [E11.9]  DISCHARGE DIAGNOSIS:  Active Problems:   Hypoxia   SECONDARY DIAGNOSIS:   Past Medical History  Diagnosis Date  . Diabetes mellitus without complication ([place])   . Hypertension   . Sleep apnea   . Hyperlipidemia     HOSPITAL COURSE:   82 year old female with past history of diabetes, hypertension, obstructive sleep apnea, hyperlipidemia who presents to the hospital due to shortness of breath and diarrhea.  1. C. difficile diarrhea-Pt. presented to the hospital with diarrhea. patient's PCR is positive for C. difficile. She has not been on any recent antibiotics. -Patient was started on oral vancomycin. She has clinically improved. She denies any abdominal pain and diarrhea is improved. -Her diet has been advanced to a regular diet which she is tolerating well. She will continue to finish her oral vancomycin for total of 14 days for treatment of her C. difficile.  2. Shortness of breath-chronic in nature. Likely due to underlying COPD/obstructive sleep apnea. -Patient was seen by pulmonary and she will need outpatient follow-up and PFTs as an outpatient. -She is being discharged on Advair as maintenance therapy and follow-up with them as an outpatient. She will continue oxygen at home.  3. Diabetes type 2 without complication-while in the hospital patient was maintained on sliding scale insulin. -She will resume her metformin upon discharge.  4.  Essential hypertension- she will continue Cardizem, losartan, metoprolol, triamterene/HCTZ.  5. Leukocytosis-secondary to the C. difficile colitis/diarrhea.  - now much improved and normalized.   6. Hypothyroidism- she will continue Synthroid.  7. GERD- Pt. Was on Omeprazole and ?? If use of PPI's was the cause of his C. Diff diarrhea.  - Omeprazole was d/c and started on Zantac upon discharge.   8. Hyperlipidemia-continue Pravachol  DISCHARGE CONDITIONS:   Stable.   CONSULTS OBTAINED:  Treatment Team:  Laverle Hobby, MD Flora Lipps, MD  DRUG ALLERGIES:   Allergies  Allergen Reactions  . Contrast Media [Iodinated Diagnostic Agents] Hives and Shortness Of Breath    Restless   . Sulfur Hives and Shortness Of Breath  . Codeine Nausea And Vomiting  . Morphine Nausea And Vomiting  . Oxycodone Nausea And Vomiting    dizzy  . Penicillins Other (See Comments)    "Hardened lump" Has patient had a PCN reaction causing immediate rash, facial/tongue/throat swelling, SOB or lightheadedness with hypotension: No Has patient had a PCN reaction causing severe rash involving mucus membranes or skin necrosis: No Has patient had a PCN reaction that required hospitalization No Has patient had a PCN reaction occurring within the last 10 years: No If all of the above answers are "NO", then may proceed with Cephalosporin use.    . Sulfa Antibiotics Rash    DISCHARGE MEDICATIONS:   Current Discharge Medication List    START taking these medications   Details  ranitidine (ZANTAC) 150 MG tablet Take 1 tablet (150 mg total) by mouth 2 (two) times daily. Qty: 60 tablet, Refills: 1    vancomycin (VANCOCIN  HCL) 125 MG capsule Take 1 capsule (125 mg total) by mouth 4 (four) times daily. Qty: 55 capsule, Refills: 0      CONTINUE these medications which have NOT CHANGED   Details  Coenzyme Q10 (CO Q 10 PO) Take 1 tablet by mouth daily.    diltiazem (TIAZAC) 300 MG 24 hr capsule  Take 300 mg by mouth daily.    levothyroxine (SYNTHROID, LEVOTHROID) 75 MCG tablet Take 75 mcg by mouth daily before breakfast.    losartan (COZAAR) 50 MG tablet Take 50 mg by mouth daily.    metFORMIN (GLUCOPHAGE) 500 MG tablet Take 500 mg by mouth at bedtime.    metoprolol succinate (TOPROL-XL) 50 MG 24 hr tablet Take 50 mg by mouth at bedtime. Take with or immediately following a meal.    Multiple Vitamins-Minerals (PRESERVISION AREDS PO) Take 1 tablet by mouth daily.    naproxen sodium (ANAPROX) 220 MG tablet Take 220 mg by mouth 2 (two) times daily as needed (back pain).    pravastatin (PRAVACHOL) 40 MG tablet Take 40 mg by mouth at bedtime.     sitaGLIPtin (JANUVIA) 50 MG tablet Take 50 mg by mouth daily.    triamterene-hydrochlorothiazide (MAXZIDE-25) 37.5-25 MG per tablet Take 0.5 tablets by mouth daily.      STOP taking these medications     omeprazole (PRILOSEC) 20 MG capsule      metoprolol (LOPRESSOR) 50 MG tablet      calcium carbonate (TUMS - DOSED IN MG ELEMENTAL CALCIUM) 500 MG chewable tablet      DULoxetine (CYMBALTA) 20 MG capsule      triamcinolone ointment (KENALOG) 0.1 %          DISCHARGE INSTRUCTIONS:   DIET:  Cardiac diet and Diabetic diet  DISCHARGE CONDITION:  Stable  ACTIVITY:  Activity as tolerated  OXYGEN:  Home Oxygen: Yes.     Oxygen Delivery: 2 liters/min via Patient connected to nasal cannula oxygen  DISCHARGE LOCATION:  home   If you experience worsening of your admission symptoms, develop shortness of breath, life threatening emergency, suicidal or homicidal thoughts you must seek medical attention immediately by calling 911 or calling your MD immediately  if symptoms less severe.  You Must read complete instructions/literature along with all the possible adverse reactions/side effects for all the Medicines you take and that have been prescribed to you. Take any new Medicines after you have completely understood and accpet  all the possible adverse reactions/side effects.   Please note  You were cared for by a hospitalist during your hospital stay. If you have any questions about your discharge medications or the care you received while you were in the hospital after you are discharged, you can call the unit and asked to speak with the hospitalist on call if the hospitalist that took care of you is not available. Once you are discharged, your primary care physician will handle any further medical issues. Please note that NO REFILLS for any discharge medications will be authorized once you are discharged, as it is imperative that you return to your primary care physician (or establish a relationship with a primary care physician if you do not have one) for your aftercare needs so that they can reassess your need for medications and monitor your lab values.     Today   Diarrhea resolved.  No abdominal pain, N/V.    VITAL SIGNS:  Blood pressure 166/62, pulse 59, temperature 98.5 F (36.9 C), temperature source Oral, resp.  rate 18, height 4\' 11"  (1.499 m), weight 101.651 kg (224 lb 1.6 oz), SpO2 93 %.  I/O:   Intake/Output Summary (Last 24 hours) at 11/07/15 1356 Last data filed at 11/07/15 0925  Gross per 24 hour  Intake 2580.75 ml  Output   1050 ml  Net 1530.75 ml    PHYSICAL EXAMINATION:  GENERAL:  82 y.o.-year-old patient lying in the bed with no acute distress.  EYES: Pupils equal, round, reactive to light and accommodation. No scleral icterus. Extraocular muscles intact.  HEENT: Head atraumatic, normocephalic. Oropharynx and nasopharynx clear.  NECK:  Supple, no jugular venous distention. No thyroid enlargement, no tenderness.  LUNGS: Normal breath sounds bilaterally, no wheezing, rales,rhonchi. No use of accessory muscles of respiration.  CARDIOVASCULAR: S1, S2 normal. No murmurs, rubs, or gallops.  ABDOMEN: Soft, non-tender, non-distended. Bowel sounds present. No organomegaly or mass.   EXTREMITIES: No pedal edema, cyanosis, or clubbing.  NEUROLOGIC: Cranial nerves II through XII are intact. No focal motor or sensory defecits b/l.  PSYCHIATRIC: The patient is alert and oriented x 3. Good affect.  SKIN: No obvious rash, lesion, or ulcer.   DATA REVIEW:   CBC  Recent Labs Lab 11/07/15 0513  WBC 8.4  HGB 11.5*  HCT 34.8*  PLT 236    Chemistries   Recent Labs Lab 11/05/15 2332 11/07/15 0513  NA 136 137  K 4.2 4.1  CL 99* 105  CO2 28 28  GLUCOSE 189* 108*  BUN 25* 13  CREATININE 1.12* 0.77  CALCIUM 9.8 8.2*  AST 31  --   ALT 28  --   ALKPHOS 87  --   BILITOT 0.7  --     Cardiac Enzymes  Recent Labs Lab 11/05/15 2332  TROPONINI <0.03    Microbiology Results  Results for orders placed or performed during the hospital encounter of 11/05/15  C difficile quick scan w PCR reflex     Status: Abnormal   Collection Time: 11/06/15  1:38 AM  Result Value Ref Range Status   C Diff antigen POSITIVE (A) NEGATIVE Final    Comment: CRITICAL RESULT CALLED TO, READ BACK BY AND VERIFIED WITH: ANDREA BRYANT ON 11/06/15 AT 0455 BY TLB    C Diff toxin NEGATIVE NEGATIVE Final   C Diff interpretation   Final    Positive for toxigenic C. difficile, active toxin production not detected. Patient has toxigenic C. difficile organisms present in the bowel, but toxin was not detected. The patient may be a carrier or the level of toxin in the sample was below the limit  of detection. This information should be used in conjunction with the patient's clinical history when deciding on possible therapy.   Gastrointestinal Panel by PCR , Stool     Status: None   Collection Time: 11/06/15  1:38 AM  Result Value Ref Range Status   Campylobacter species NOT DETECTED NOT DETECTED Final   Plesimonas shigelloides NOT DETECTED NOT DETECTED Final   Salmonella species NOT DETECTED NOT DETECTED Final   Yersinia enterocolitica NOT DETECTED NOT DETECTED Final   Vibrio species NOT  DETECTED NOT DETECTED Final   Vibrio cholerae NOT DETECTED NOT DETECTED Final   Enteroaggregative E coli (EAEC) NOT DETECTED NOT DETECTED Final   Enteropathogenic E coli (EPEC) NOT DETECTED NOT DETECTED Final   Enterotoxigenic E coli (ETEC) NOT DETECTED NOT DETECTED Final   Shiga like toxin producing E coli (STEC) NOT DETECTED NOT DETECTED Final   E. coli O157 NOT DETECTED NOT  DETECTED Final   Shigella/Enteroinvasive E coli (EIEC) NOT DETECTED NOT DETECTED Final   Cryptosporidium NOT DETECTED NOT DETECTED Final   Cyclospora cayetanensis NOT DETECTED NOT DETECTED Final   Entamoeba histolytica NOT DETECTED NOT DETECTED Final   Giardia lamblia NOT DETECTED NOT DETECTED Final   Adenovirus F40/41 NOT DETECTED NOT DETECTED Final   Astrovirus NOT DETECTED NOT DETECTED Final   Norovirus GI/GII NOT DETECTED NOT DETECTED Final   Rotavirus A NOT DETECTED NOT DETECTED Final   Sapovirus (I, II, IV, and V) NOT DETECTED NOT DETECTED Final  Clostridium Difficile by PCR     Status: Abnormal   Collection Time: 11/06/15  1:38 AM  Result Value Ref Range Status   Toxigenic C Difficile by pcr POSITIVE (A) NEGATIVE Final    Comment: CRITICAL RESULT CALLED TO, READ BACK BY AND VERIFIED WITH: ANDREA BRYANT ON 11/06/15 AT 0455 BY TLB     RADIOLOGY:  Ct Abdomen Pelvis Wo Contrast  11/06/2015  CLINICAL DATA:  Nausea vomiting and diaphoresis today. Syncopal episode. EXAM: CT ABDOMEN AND PELVIS WITHOUT CONTRAST TECHNIQUE: Multidetector CT imaging of the abdomen and pelvis was performed following the standard protocol without IV contrast. COMPARISON:  None. FINDINGS: There are unremarkable unenhanced appearances of the liver, bile ducts, pancreas, spleen, right adrenal and kidneys. There is a 2 cm indeterminate left adrenal nodule. The abdominal aorta is normal in caliber and heavily calcified. Bowel is remarkable only for uncomplicated diverticulosis. There is hysterectomy. No adnexal abnormality is evident. There is  cholecystectomy. No acute inflammatory changes are evident in the abdomen or pelvis. There is no adenopathy. There is no ascites. There is no significant skeletal lesion. There is no significant abnormality in the lower chest. IMPRESSION: No acute findings are evident in the abdomen or pelvis. Diverticulosis 2 cm left adrenal nodule, more likely benign based on its low-attenuation but not conclusively characterized. Consider a follow-up CT or MRI in 12 months. Electronically Signed   By: Andreas Newport M.D.   On: 11/06/2015 03:34   Dg Chest Port 1 View  11/06/2015  CLINICAL DATA:  82 year old female with nausea vomiting and diarrhea. Patient had syncopal episode. EXAM: PORTABLE CHEST 1 VIEW COMPARISON:  Chest CT dated 02/21/2011 and radiograph dated 02/21/2011 stop FINDINGS: Single-view of the chest does not demonstrate a focal consolidation. There is no pleural effusion or pneumothorax. There is mild emphysematous changes of the lungs. Top-normal cardiac size. There is mild deviation of the trachea to the left of the midline, likely caused by upper mediastinal/thyroid lesion seen on the prior CT. The osseous structures are grossly unremarkable. IMPRESSION: No active disease. Electronically Signed   By: Anner Crete M.D.   On: 11/06/2015 01:38      Management plans discussed with the patient, family and they are in agreement.  CODE STATUS:     Code Status Orders        Start     Ordered   11/06/15 0503  Full code   Continuous     11/06/15 0503    Code Status History    Date Active Date Inactive Code Status Order ID Comments User Context   This patient has a current code status but no historical code status.    Advance Directive Documentation        Most Recent Value   Type of Advance Directive  -- [pt thinks she has a living will]   Pre-existing out of facility DNR order (yellow form or pink MOST form)     "  MOST" Form in Place?        TOTAL TIME TAKING CARE OF THIS PATIENT: 40  minutes.    Henreitta Leber M.D on 11/07/2015 at 1:56 PM  Between 7am to 6pm - Pager - [phone]  After 6pm go to [email or web address] - password [email or web address] [facility]  Office  [phone]  CC: Primary care physician; Perrin Maltese, MD

## 2015-11-08 ENCOUNTER — Telehealth: Payer: Self-pay | Admitting: *Deleted

## 2015-11-08 NOTE — Telephone Encounter (Signed)
-----   Message from Laverle Hobby, MD sent at 11/07/2015  9:48 AM EDT ----- Regarding: HFU Please arrange PFT in 3 -4 weeks, and follow up with me afterwards.

## 2015-11-08 NOTE — Telephone Encounter (Signed)
LM on VM for pt to return call to schedule PFT and hosp f/u in 3-4 weeks with DR.

## 2015-11-11 NOTE — Telephone Encounter (Signed)
PFT and appt scheduled. Nothing further needed.

## 2015-11-14 DIAGNOSIS — I10 Essential (primary) hypertension: Secondary | ICD-10-CM | POA: Diagnosis not present

## 2015-11-14 DIAGNOSIS — E782 Mixed hyperlipidemia: Secondary | ICD-10-CM | POA: Diagnosis not present

## 2015-11-14 DIAGNOSIS — E039 Hypothyroidism, unspecified: Secondary | ICD-10-CM | POA: Diagnosis not present

## 2015-11-14 DIAGNOSIS — E119 Type 2 diabetes mellitus without complications: Secondary | ICD-10-CM | POA: Diagnosis not present

## 2015-11-29 DIAGNOSIS — N390 Urinary tract infection, site not specified: Secondary | ICD-10-CM | POA: Diagnosis not present

## 2015-12-02 ENCOUNTER — Encounter: Payer: Self-pay | Admitting: Emergency Medicine

## 2015-12-02 ENCOUNTER — Emergency Department
Admission: EM | Admit: 2015-12-02 | Discharge: 2015-12-02 | Disposition: A | Discharge status: Home / Self Care | Payer: PPO | Attending: Emergency Medicine | Admitting: Emergency Medicine

## 2015-12-02 ENCOUNTER — Emergency Department: Payer: PPO

## 2015-12-02 DIAGNOSIS — Z79899 Other long term (current) drug therapy: Secondary | ICD-10-CM | POA: Insufficient documentation

## 2015-12-02 DIAGNOSIS — E785 Hyperlipidemia, unspecified: Secondary | ICD-10-CM | POA: Insufficient documentation

## 2015-12-02 DIAGNOSIS — R002 Palpitations: Secondary | ICD-10-CM

## 2015-12-02 DIAGNOSIS — E119 Type 2 diabetes mellitus without complications: Secondary | ICD-10-CM | POA: Diagnosis not present

## 2015-12-02 DIAGNOSIS — K219 Gastro-esophageal reflux disease without esophagitis: Secondary | ICD-10-CM | POA: Insufficient documentation

## 2015-12-02 DIAGNOSIS — R0602 Shortness of breath: Secondary | ICD-10-CM | POA: Diagnosis not present

## 2015-12-02 DIAGNOSIS — R531 Weakness: Secondary | ICD-10-CM | POA: Insufficient documentation

## 2015-12-02 DIAGNOSIS — Z7984 Long term (current) use of oral hypoglycemic drugs: Secondary | ICD-10-CM | POA: Insufficient documentation

## 2015-12-02 DIAGNOSIS — I10 Essential (primary) hypertension: Secondary | ICD-10-CM | POA: Insufficient documentation

## 2015-12-02 DIAGNOSIS — Z87891 Personal history of nicotine dependence: Secondary | ICD-10-CM | POA: Insufficient documentation

## 2015-12-02 DIAGNOSIS — R0789 Other chest pain: Secondary | ICD-10-CM | POA: Diagnosis not present

## 2015-12-02 HISTORY — DX: Disorder of thyroid, unspecified: E07.9

## 2015-12-02 LAB — URINALYSIS COMPLETE WITH MICROSCOPIC (ARMC ONLY)
BACTERIA UA: NONE SEEN
Bilirubin Urine: NEGATIVE
Glucose, UA: NEGATIVE mg/dL
Hgb urine dipstick: NEGATIVE
Ketones, ur: NEGATIVE mg/dL
Nitrite: NEGATIVE
PH: 7 (ref 5.0–8.0)
PROTEIN: NEGATIVE mg/dL
Specific Gravity, Urine: 1.009 (ref 1.005–1.030)

## 2015-12-02 LAB — CBC
HCT: 41.6 % (ref 35.0–47.0)
Hemoglobin: 14.2 g/dL (ref 12.0–16.0)
MCH: 29.7 pg (ref 26.0–34.0)
MCHC: 34 g/dL (ref 32.0–36.0)
MCV: 87.3 fL (ref 80.0–100.0)
Platelets: 285 10*3/uL (ref 150–440)
RBC: 4.77 MIL/uL (ref 3.80–5.20)
RDW: 13.4 % (ref 11.5–14.5)
WBC: 9 10*3/uL (ref 3.6–11.0)

## 2015-12-02 LAB — BASIC METABOLIC PANEL
Anion gap: 10 (ref 5–15)
BUN: 18 mg/dL (ref 6–20)
CALCIUM: 9.6 mg/dL (ref 8.9–10.3)
CO2: 23 mmol/L (ref 22–32)
Chloride: 100 mmol/L — ABNORMAL LOW (ref 101–111)
Creatinine, Ser: 0.89 mg/dL (ref 0.44–1.00)
GFR calc Af Amer: >60 mL/min (ref >60)
GFR, EST NON AFRICAN AMERICAN: 59 mL/min — AB (ref >60)
GLUCOSE: 172 mg/dL — AB (ref 65–99)
Potassium: 3.7 mmol/L (ref 3.5–5.1)
Sodium: 133 mmol/L — ABNORMAL LOW (ref 135–145)

## 2015-12-02 LAB — TROPONIN I: Troponin I: <0.03 ng/mL (ref <0.031)

## 2015-12-02 LAB — FIBRIN DERIVATIVES D-DIMER (ARMC ONLY): FIBRIN DERIVATIVES D-DIMER (ARMC): 1965 — AB (ref 0–499)

## 2015-12-02 MED ORDER — SODIUM CHLORIDE 0.9 % IV SOLN
1000.0000 mL | Freq: Once | INTRAVENOUS | Status: AC
  Administered 2015-12-02: 1000 mL via INTRAVENOUS

## 2015-12-02 MED ORDER — HYDROCORTISONE NA SUCCINATE PF 100 MG IJ SOLR
100.0000 mg | Freq: Once | INTRAMUSCULAR | Status: AC
  Administered 2015-12-02: 100 mg via INTRAVENOUS
  Filled 2015-12-02: qty 2

## 2015-12-02 MED ORDER — IOPAMIDOL (ISOVUE-370) INJECTION 76%
75.0000 mL | Freq: Once | INTRAVENOUS | Status: AC | PRN
  Administered 2015-12-02: 75 mL via INTRAVENOUS

## 2015-12-02 MED ORDER — LORAZEPAM 0.5 MG PO TABS: 0.5000 mg | ORAL_TABLET | Freq: Three times a day (TID) | ORAL | Status: DC | PRN

## 2015-12-02 MED ORDER — DIPHENHYDRAMINE HCL 50 MG/ML IJ SOLN
25.0000 mg | Freq: Once | INTRAMUSCULAR | Status: AC
  Administered 2015-12-02: 25 mg via INTRAVENOUS
  Filled 2015-12-02: qty 1

## 2015-12-02 NOTE — ED Notes (Signed)
AAOx3.  Skin warm and dry.  NAD 

## 2015-12-02 NOTE — ED Provider Notes (Signed)
Patient received in checkout from Dr. Corky Downs, CT scan findings  IMPRESSION: 1. No evidence of pulmonary embolus. 2. Coronary artery atherosclerosis. 3. Large stable right thyroid goiter with mediastinal extension.  Per his plan, patient will be discharged and encouraged to have close follow-up with her doctor.   Earleen Newport, MD 12/02/15 1535

## 2015-12-02 NOTE — Discharge Instructions (Signed)
Weakness Weakness is a lack of strength. It may be felt all over the body (generalized) or in one specific part of the body (focal). Some causes of weakness can be serious. You may need further medical evaluation, especially if you are elderly or you have a history of immunosuppression (such as chemotherapy or HIV), kidney disease, heart disease, or diabetes. CAUSES  Weakness can be caused by many different things, including:  Infection.  Physical exhaustion.  Internal bleeding or other blood loss that results in a lack of red blood cells (anemia).  Dehydration. This cause is more common in elderly people.  Side effects or electrolyte abnormalities from medicines, such as pain medicines or sedatives.  Emotional distress, anxiety, or depression.  Circulation problems, especially severe peripheral arterial disease.  Heart disease, such as rapid atrial fibrillation, bradycardia, or heart failure.  Nervous system disorders, such as Guillain-Barr syndrome, multiple sclerosis, or stroke. DIAGNOSIS  To find the cause of your weakness, your caregiver will take your history and perform a physical exam. Lab tests or X-rays may also be ordered, if needed. TREATMENT  Treatment of weakness depends on the cause of your symptoms and can vary greatly. HOME CARE INSTRUCTIONS   Rest as needed.  Eat a well-balanced diet.  Try to get some exercise every day.  Only take over-the-counter or prescription medicines as directed by your caregiver. SEEK MEDICAL CARE IF:   Your weakness seems to be getting worse or spreads to other parts of your body.  You develop new aches or pains. SEEK IMMEDIATE MEDICAL CARE IF:   You cannot perform your normal daily activities, such as getting dressed and feeding yourself.  You cannot walk up and down stairs, or you feel exhausted when you do so.  You have shortness of breath or chest pain.  You have difficulty moving parts of your body.  You have weakness  in only one area of the body or on only one side of the body.  You have a fever.  You have trouble speaking or swallowing.  You cannot control your bladder or bowel movements.  You have black or bloody vomit or stools. MAKE SURE YOU:  Understand these instructions.  Will watch your condition.  Will get help right away if you are not doing well or get worse.   This information is not intended to replace advice given to you by your health care provider. Make sure you discuss any questions you have with your health care provider.   Document Released: 06/15/2005 Document Revised: 12/15/2011 Document Reviewed: 08/14/2011 Elsevier Interactive Patient Education [employer].  Palpitations A palpitation is the feeling that your heartbeat is irregular or is faster than normal. It may feel like your heart is fluttering or skipping a beat. Palpitations are usually not a serious problem. However, in some cases, you may need further medical evaluation. CAUSES  Palpitations can be caused by:  Smoking.  Caffeine or other stimulants, such as diet pills or energy drinks.  Alcohol.  Stress and anxiety.  Strenuous physical activity.  Fatigue.  Certain medicines.  Heart disease, especially if you have a history of irregular heart rhythms (arrhythmias), such as atrial fibrillation, atrial flutter, or supraventricular tachycardia.  An improperly working pacemaker or defibrillator. DIAGNOSIS  To find the cause of your palpitations, your health care provider will take your medical history and perform a physical exam. Your health care provider may also have you take a test called an ambulatory electrocardiogram (ECG). An ECG records your heartbeat  patterns over a 24-hour period. You may also have other tests, such as:  Transthoracic echocardiogram (TTE). During echocardiography, sound waves are used to evaluate how blood flows through your heart.  Transesophageal echocardiogram  (TEE).  Cardiac monitoring. This allows your health care provider to monitor your heart rate and rhythm in real time.  Holter monitor. This is a portable device that records your heartbeat and can help diagnose heart arrhythmias. It allows your health care provider to track your heart activity for several days, if needed.  Stress tests by exercise or by giving medicine that makes the heart beat faster. TREATMENT  Treatment of palpitations depends on the cause of your symptoms and can vary greatly. Most cases of palpitations do not require any treatment other than time, relaxation, and monitoring your symptoms. Other causes, such as atrial fibrillation, atrial flutter, or supraventricular tachycardia, usually require further treatment. HOME CARE INSTRUCTIONS   Avoid:  Caffeinated coffee, tea, soft drinks, diet pills, and energy drinks.  Chocolate.  Alcohol.  Stop smoking if you smoke.  Reduce your stress and anxiety. Things that can help you relax include:  A method of controlling things in your body, such as your heartbeats, with your mind (biofeedback).  Yoga.  Meditation.  Physical activity such as swimming, jogging, or walking.  Get plenty of rest and sleep. SEEK MEDICAL CARE IF:   You continue to have a fast or irregular heartbeat beyond 24 hours.  Your palpitations occur more often. SEEK IMMEDIATE MEDICAL CARE IF:  You have chest pain or shortness of breath.  You have a severe headache.  You feel dizzy or you faint. MAKE SURE YOU:  Understand these instructions.  Will watch your condition.  Will get help right away if you are not doing well or get worse.   This information is not intended to replace advice given to you by your health care provider. Make sure you discuss any questions you have with your health care provider.   Document Released: 06/12/2000 Document Revised: 06/20/2013 Document Reviewed: 08/14/2011 Elsevier Interactive Patient Education [employer]

## 2015-12-02 NOTE — ED Notes (Signed)
Pt states she hasn't been feeling well for a week now, states yesterday she began having diaphoresis, discomfort in her upper abdomen area, had some severe reflux the night before, was treated 3 weeks ago for cdiff, states she feels her heart jumping every now and then, and having cramping in her upper back today, does not have her gallbladder.

## 2015-12-02 NOTE — ED Notes (Signed)
Pt states she is having a "burning" sensation in her left hand, started yesterday. States it is not numb, is not tingling. No other signs of tia/cva noted.

## 2015-12-02 NOTE — ED Notes (Signed)
States she feels weak and shaky, denies pain, is diabetic, glucose 126 at home.

## 2015-12-02 NOTE — ED Provider Notes (Signed)
[facility] Emergency Department Provider Note  ____________________________________________    I have reviewed the triage vital signs and the nursing notes.   HISTORY  Chief Complaint Palpitations    HPI Kelsey Young is a 82 y.o. female who presents with complaints of diffuse weakness and palpitations. She reports she occasionally feels short of breath as well. She denies fevers or chills. No cough. She has had some burning in her chest which she believes may be related to acid reflux but she is unsure. No nausea or vomiting.     Past Medical History  Diagnosis Date  . Diabetes mellitus without complication ([place])   . Hypertension   . Sleep apnea   . Hyperlipidemia   . Thyroid disease     Patient Active Problem List   Diagnosis Date Noted  . Hypoxia 11/06/2015  . DDD (degenerative disc disease), lumbar 11/12/2014  . Lumbar canal stenosis 11/12/2014  . Degenerative arthritis of lumbar spine with cord compression 11/12/2014  . Acid reflux 12/19/2013  . HLD (hyperlipidemia) 12/19/2013  . BP (high blood pressure) 12/19/2013  . Apnea, sleep 12/19/2013  . Diabetes mellitus, type 2 ([place]) 05/15/2013  . Adrenal adenoma 10/17/2012  . Intrathoracic goiter 10/17/2012    Past Surgical History  Procedure Laterality Date  . Cholecystectomy    . Joint replacement    . Abdominal hysterectomy    . Tonsillectomy    . Appendectomy    . Thyroidectomy, partial      Current Outpatient Rx  Name  Route  Sig  Dispense  Refill  . Coenzyme Q10 (CO Q 10 PO)   Oral   Take 1 tablet by mouth daily.         Marland Kitchen diltiazem (TIAZAC) 300 MG 24 hr capsule   Oral   Take 300 mg by mouth daily.         . Fluticasone-Salmeterol (ADVAIR DISKUS) 250-50 MCG/DOSE AEPB   Inhalation   Inhale 1 puff into the lungs 2 (two) times daily.   60 each   1   . levothyroxine (SYNTHROID, LEVOTHROID) 75 MCG tablet   Oral   Take 75 mcg by mouth daily before breakfast.          . losartan (COZAAR) 50 MG tablet   Oral   Take 50 mg by mouth daily.         . metFORMIN (GLUCOPHAGE) 500 MG tablet   Oral   Take 500 mg by mouth at bedtime.         . metoprolol succinate (TOPROL-XL) 50 MG 24 hr tablet   Oral   Take 50 mg by mouth at bedtime. Take with or immediately following a meal.         . Multiple Vitamins-Minerals (PRESERVISION AREDS 2) CAPS   Oral   Take 1 capsule by mouth daily.         . naproxen sodium (ANAPROX) 220 MG tablet   Oral   Take 220 mg by mouth 2 (two) times daily as needed (back pain).         . pravastatin (PRAVACHOL) 40 MG tablet   Oral   Take 40 mg by mouth at bedtime.          . ranitidine (ZANTAC) 150 MG tablet   Oral   Take 1 tablet (150 mg total) by mouth 2 (two) times daily.   60 tablet   1   . sitaGLIPtin (JANUVIA) 50 MG tablet   Oral  Take 50 mg by mouth daily.         Marland Kitchen triamterene-hydrochlorothiazide (MAXZIDE-25) 37.5-25 MG per tablet   Oral   Take 0.5 tablets by mouth daily.           Allergies Contrast media; Sulfur; Codeine; Morphine; Oxycodone; Penicillins; and Sulfa antibiotics  Family History  Problem Relation Age of Onset  . Hypertension Other     Social History Social History  Substance Use Topics  . Smoking status: Former Research scientist (life sciences)  . Smokeless tobacco: Never Used  . Alcohol Use: 0.6 - 1.2 oz/week    1-2 Standard drinks or equivalent per week     Comment: wine 3 times a week    Review of Systems  Constitutional: Negative for fever. Eyes: Negative for redness ENT: Negative for sore throat Cardiovascular: As above Respiratory: Negative for shortness of breath. Gastrointestinal: As above Genitourinary: Negative for dysuria. Positive for frequency Musculoskeletal: Negative for back pain. Skin: Negative for rash. Neurological: Negative for focal weakness Psychiatric: no anxiety    ____________________________________________   PHYSICAL EXAM:  VITAL SIGNS: ED  Triage Vitals  Enc Vitals Group     BP 12/02/15 0938 131/67 mmHg     Pulse Rate 12/02/15 0938 107     Resp 12/02/15 0938 18     Temp 12/02/15 0938 98.7 F (37.1 C)     Temp Source 12/02/15 0938 Oral     SpO2 12/02/15 0938 95 %     Weight 12/02/15 0938 210 lb (95.255 kg)     Height 12/02/15 0938 4\' 11"  (1.499 m)     Head Cir --      Peak Flow --      Pain Score 12/02/15 0934 0     Pain Loc --      Pain Edu? --      Excl. in [place]? --      Constitutional: Alert and oriented. Well appearing and in no distress.  Eyes: Conjunctivae are normal. No erythema or injection ENT   Head: Normocephalic and atraumatic.   Mouth/Throat: Mucous membranes are moist. Cardiovascular: Tachycardia regular rhythm. Normal and symmetric distal pulses are present in the upper extremities. No murmurs or rubs  Respiratory: Normal respiratory effort without tachypnea nor retractions. Breath sounds are clear and equal bilaterally.  Gastrointestinal: Soft and non-tender in all quadrants. No distention. There is no CVA tenderness. Genitourinary: deferred Musculoskeletal: Nontender with normal range of motion in all extremities. No lower extremity tenderness nor edema. Neurologic:  Normal speech and language. No gross focal neurologic deficits are appreciated. Skin:  Skin is warm, dry and intact. No rash noted. Psychiatric: Mood and affect are normal. Patient exhibits appropriate insight and judgment.  ____________________________________________    LABS (pertinent positives/negatives)  Labs Reviewed  BASIC METABOLIC PANEL - Abnormal; Notable for the following:    Sodium 133 (*)    Chloride 100 (*)    Glucose, Bld 172 (*)    GFR calc non Af Amer 59 (*)    All other components within normal limits  URINALYSIS COMPLETEWITH MICROSCOPIC ([facility] ONLY) - Abnormal; Notable for the following:    Color, Urine YELLOW (*)    APPearance CLEAR (*)    Leukocytes, UA TRACE (*)    Squamous Epithelial / LPF 0-5 (*)     All other components within normal limits  FIBRIN DERIVATIVES D-DIMER ([facility] ONLY) - Abnormal; Notable for the following:    Fibrin derivatives D-dimer [facility]) 1965 (*)    All other components within normal  limits  CBC  TROPONIN I    ____________________________________________   EKG  ED ECG REPORT I, Lavonia Drafts, the attending physician, personally viewed and interpreted this ECG.  Date: 12/02/2015 EKG Time: 9:36 AM Rate: 109 Rhythm: normal sinus rhythm QRS Axis: normal Intervals: normal ST/T Wave abnormalities: normal Conduction Disturbances: none Narrative Interpretation: unremarkable   ____________________________________________    RADIOLOGY  Chest x-ray normal  ____________________________________________   PROCEDURES  Procedure(s) performed: none  Critical Care performed: none  ____________________________________________   INITIAL IMPRESSION / ASSESSMENT AND PLAN / ED COURSE  Pertinent labs & imaging results that were available during my care of the patient were reviewed by me and considered in my medical decision making (see chart for details).  She presents with vague complaints of weakness and mild short of breath and elevated heart rate. She is tachycardic upon arrival and also she complains of pain in her chest and a tingling sensation in her left hand. We will send labs, d-dimer  Workup is overall reassuring except for significantly elevated d-dimer. Patient has a contrast allergy which is significant. Given her continued chest discomfort, continue tachycardia and elevated d-dimer  will admit her to the hospital for VQ scan. I discussed this with the patient but she strongly preferred to go home, she says that she has had CT scans without allergic reaction as long she has Benadryl prior to her scan. She would prefer to have a CT angiography in the emergency department to rule out PE. She understands the risks of allergic reaction and accepts  these risks.  ____________________________________________   FINAL CLINICAL IMPRESSION(S) / ED DIAGNOSES  Final diagnoses:  Weakness  Acid reflux        Lavonia Drafts, MD 12/02/15 1513

## 2015-12-03 ENCOUNTER — Ambulatory Visit (INDEPENDENT_AMBULATORY_CARE_PROVIDER_SITE_OTHER): Payer: PPO | Admitting: *Deleted

## 2015-12-03 DIAGNOSIS — J9601 Acute respiratory failure with hypoxia: Secondary | ICD-10-CM

## 2015-12-03 LAB — PULMONARY FUNCTION TEST
DL/VA % pred: 82 %
DL/VA: 3.35 ml/min/mmHg/L
DLCO UNC % PRED: 68 %
DLCO UNC: 11.96 ml/min/mmHg
FEF 25-75 POST: 1.64 L/s
FEF 25-75 Pre: 1.42 L/sec
FEF2575-%CHANGE-POST: 15 %
FEF2575-%PRED-POST: 157 %
FEF2575-%Pred-Pre: 136 %
FEV1-%CHANGE-POST: 3 %
FEV1-%PRED-PRE: 88 %
FEV1-%Pred-Post: 91 %
FEV1-Post: 1.31 L
FEV1-Pre: 1.26 L
FEV1FVC-%CHANGE-POST: 0 %
FEV1FVC-%PRED-PRE: 112 %
FEV6-%Change-Post: 4 %
FEV6-%PRED-PRE: 84 %
FEV6-%Pred-Post: 87 %
FEV6-POST: 1.6 L
FEV6-Pre: 1.53 L
FEV6FVC-%Pred-Post: 107 %
FEV6FVC-%Pred-Pre: 107 %
FVC-%Change-Post: 4 %
FVC-%PRED-PRE: 78 %
FVC-%Pred-Post: 81 %
FVC-POST: 1.6 L
FVC-PRE: 1.53 L
POST FEV1/FVC RATIO: 82 %
PRE FEV1/FVC RATIO: 82 %
PRE FEV6/FVC RATIO: 100 %
Post FEV6/FVC ratio: 100 %

## 2015-12-03 NOTE — Progress Notes (Signed)
PFT performed today with Nitrogen washout. 

## 2015-12-05 ENCOUNTER — Encounter: Payer: Self-pay | Admitting: Internal Medicine

## 2015-12-05 ENCOUNTER — Ambulatory Visit (INDEPENDENT_AMBULATORY_CARE_PROVIDER_SITE_OTHER): Payer: PPO | Admitting: Internal Medicine

## 2015-12-05 VITALS — BP 118/70 | HR 89 | Ht 59.0 in | Wt 212.0 lb

## 2015-12-05 DIAGNOSIS — J438 Other emphysema: Secondary | ICD-10-CM | POA: Diagnosis not present

## 2015-12-05 DIAGNOSIS — J9601 Acute respiratory failure with hypoxia: Secondary | ICD-10-CM | POA: Diagnosis not present

## 2015-12-05 MED ORDER — ALBUTEROL SULFATE HFA 108 (90 BASE) MCG/ACT IN AERS: 2.0000 | INHALATION_SPRAY | Freq: Four times a day (QID) | RESPIRATORY_TRACT | Status: DC | PRN

## 2015-12-05 NOTE — Progress Notes (Signed)
* [facility]     Assessment and Plan:  Emphysema/COPD. -Advair does not appear to be helping, can stop.  --Restrictive lung disease seen on most recent PFT 12/03/15; likely due to significant abdominal obesity.   Kelsey Young.  --Severe gerd with ranitidine, which was not present when she was taking omeprazole. Will change to pepcid OTC twice daily.  --Pt has abdominal obesity which is likely contributing.   Thyromegaly.  -Right calcified paratracheal mass thought to represent thyroid extension.   Diaphragmatic hernia.  -Small, appears unchanged compared with recent CT -AP, likely related to obesity.   Snoring.  --Symptoms suspicious for OSA, but declines to try CPAP, therefore will not order sleep study.  --Will check overnight oximetry to see if she requires oxygen overnight.   Date: 12/05/2015  MRN# [id number] Kelsey Young 04-23-1932   Kelsey Young is a 82 y.o. old female seen in follow up for chief complaint of  Chief Complaint  Patient presents with  . Hospitalization Follow-up    hypoxia; PFT results; SOB w/activity;      HPI:   Ms Kelsey Young is an 82 yo female, recently seen in the hospital for AECOPD complicated by morbid obesity and untreated OSA. She was asked to start advair and follow up OP with PFT. On that admission she was also noted to have C. Dif colitis.  Since that time she presented to the ED with vague complaints of chest pain and reflux. CT chest images showed severe apical emphysema, paratracheal adenopathy including a calcified mass suspected of being thyroid in origin. There is also a small diaphrammatic hernia, minimally changed since seen on previous CT-AP.   She is now taking ranitidine instead of omeprazole due to recent C.dif. Since that time, she has been having more reflux. She got severe reflux and chest discomfort then went to the ER.   Pulmonary function test interpretation 12/03/15: Spirometry: The FVC was 1.53 L which was 78%  of predicted, the FEV1 was 1.26 L which is 88% of predicted. The FEV to FVC ratio was 82%. There is no significant reversibility with bronchodilator. Lung volumes: Residual volume was 60%, total lung capacity was 66%, RV to TLC ratio was 94%. This is suggestive of restriction. Diffusion: Diffusion capacity was 68% of predicted. Flow volume loop showed a restrictive pattern. Interpretation: Overall this test is suggestive of restrictive lung disease, however, the patient did not blow out for 6 seconds, which may falsely suggest restrictive lung disease.   Medication:   Outpatient Encounter Prescriptions as of 12/05/2015  Medication Sig  . Coenzyme Q10 (CO Q 10 PO) Take 1 tablet by mouth daily.  Marland Kitchen diltiazem (TIAZAC) 300 MG 24 hr capsule Take 300 mg by mouth daily.  . Fluticasone-Salmeterol (ADVAIR DISKUS) 250-50 MCG/DOSE AEPB Inhale 1 puff into the lungs 2 (two) times daily.  Marland Kitchen levothyroxine (SYNTHROID, LEVOTHROID) 75 MCG tablet Take 75 mcg by mouth daily before breakfast.  . LORazepam (ATIVAN) 0.5 MG tablet Take 1 tablet (0.5 mg total) by mouth every 8 (eight) hours as needed for anxiety.  Marland Kitchen losartan (COZAAR) 50 MG tablet Take 50 mg by mouth daily.  . metFORMIN (GLUCOPHAGE) 500 MG tablet Take 500 mg by mouth at bedtime.  . metoprolol succinate (TOPROL-XL) 50 MG 24 hr tablet Take 50 mg by mouth at bedtime. Take with or immediately following a meal.  . Multiple Vitamins-Minerals (PRESERVISION AREDS 2) CAPS Take 1 capsule by mouth daily.  . naproxen sodium (ANAPROX) 220 MG tablet Take  220 mg by mouth 2 (two) times daily as needed (back pain).  . pravastatin (PRAVACHOL) 40 MG tablet Take 40 mg by mouth at bedtime.   . ranitidine (ZANTAC) 150 MG tablet Take 1 tablet (150 mg total) by mouth 2 (two) times daily.  . sitaGLIPtin (JANUVIA) 50 MG tablet Take 50 mg by mouth daily.  Marland Kitchen triamterene-hydrochlorothiazide (MAXZIDE-25) 37.5-25 MG per tablet Take 0.5 tablets by mouth daily.   No  facility-administered encounter medications on file as of 12/05/2015.     Allergies:  Contrast media; Sulfur; Codeine; Morphine; Oxycodone; Penicillins; and Sulfa antibiotics  Review of Systems: Gen:  Denies  fever, sweats. HEENT: Denies blurred vision. Cvc:  No dizziness, chest pain or heaviness Resp:   Denies cough or sputum porduction. Gi: Denies swallowing difficulty, stomach pain. constipation, bowel incontinence Gu:  Denies bladder incontinence, burning urine Ext:   No Joint pain, stiffness. Skin: No skin rash, easy bruising. Endoc:  No polyuria, polydipsia. Psych: No depression, insomnia. Other:  All other systems were reviewed and found to be negative other than what is mentioned in the HPI.   Physical Examination:   VS: BP 118/70 mmHg  Pulse 89  Ht 4\' 11"  (1.499 m)  Wt 212 lb (96.163 kg)  BMI 42.80 kg/m2  SpO2 90%  General Appearance: No distress  Neuro:without focal findings,  speech normal,  HEENT: PERRLA, EOM intact. Pulmonary: normal breath sounds, No wheezing.   CardiovascularNormal S1,S2.  No m/r/g.   Abdomen: Benign, Soft, non-tender; obesity.  Renal:  No costovertebral tenderness  GU:  Not performed at this time. Endoc: No evident thyromegaly, no signs of acromegaly. Skin:   warm, no rash. Extremities: normal, no cyanosis, clubbing.   LABORATORY PANEL:   CBC  Recent Labs Lab 12/02/15 0940  WBC 9.0  HGB 14.2  HCT 41.6  PLT 285   ------------------------------------------------------------------------------------------------------------------  Chemistries   Recent Labs Lab 12/02/15 0940  NA 133*  K 3.7  CL 100*  CO2 23  GLUCOSE 172*  BUN 18  CREATININE 0.89  CALCIUM 9.6   ------------------------------------------------------------------------------------------------------------------  Cardiac Enzymes  Recent Labs Lab 12/02/15 1520  TROPONINI <0.03   ------------------------------------------------------------  RADIOLOGY:    No results found for this or any previous visit. Results for orders placed during the hospital encounter of 12/02/15  DG Chest 2 View   Narrative CLINICAL DATA:  Pt states she hasn't been feeling well for a week now, states yesterday she began having diaphoresis, discomfort in her upper abdomen area, had some severe reflux the night before, was treated 3 weeks ago for c. difficile  EXAM: CHEST  2 VIEW  COMPARISON:  11/06/2015  FINDINGS: There is bilateral chronic interstitial thickening. There is no focal parenchymal opacity. There is no pleural effusion or pneumothorax. The heart and mediastinal contours are unremarkable. There is thoracic aortic atherosclerosis.  The osseous structures are unremarkable.  IMPRESSION: No active cardiopulmonary disease.   Electronically Signed   By: Kathreen Devoid   On: 12/02/2015 10:12    ------------------------------------------------------------------------------------------------------------------  Thank  you for allowing [facility] to assist in the care of your patient. Our recommendations are noted above.  Please contact us if we can be of further service.   Marda Stalker, MD.   Pulmonary and Critical Care Office Number: [phone]  Patricia Pesa, M.D.  Vilinda Boehringer, M.D.  Merton Border, M.D  12/05/2015

## 2015-12-05 NOTE — Patient Instructions (Addendum)
Stop zantac, instead take PEPCID OTC twice daily for 1 week. If  pepcid does not work, may go back to using omeprazole.   --Stop advair until further notice.   --Will prescribe rescue albuterol inhaler, to be used 2 puffs as needed for trouble breathing.

## 2015-12-06 DIAGNOSIS — E782 Mixed hyperlipidemia: Secondary | ICD-10-CM | POA: Diagnosis not present

## 2015-12-06 DIAGNOSIS — I10 Essential (primary) hypertension: Secondary | ICD-10-CM | POA: Diagnosis not present

## 2015-12-06 DIAGNOSIS — J449 Chronic obstructive pulmonary disease, unspecified: Secondary | ICD-10-CM | POA: Diagnosis not present

## 2015-12-06 DIAGNOSIS — K219 Gastro-esophageal reflux disease without esophagitis: Secondary | ICD-10-CM | POA: Diagnosis not present

## 2015-12-25 DIAGNOSIS — H353131 Nonexudative age-related macular degeneration, bilateral, early dry stage: Secondary | ICD-10-CM | POA: Diagnosis not present

## 2015-12-25 DIAGNOSIS — E782 Mixed hyperlipidemia: Secondary | ICD-10-CM | POA: Diagnosis not present

## 2015-12-25 DIAGNOSIS — E039 Hypothyroidism, unspecified: Secondary | ICD-10-CM | POA: Diagnosis not present

## 2015-12-25 DIAGNOSIS — I10 Essential (primary) hypertension: Secondary | ICD-10-CM | POA: Diagnosis not present

## 2015-12-25 DIAGNOSIS — E119 Type 2 diabetes mellitus without complications: Secondary | ICD-10-CM | POA: Diagnosis not present

## 2015-12-26 DIAGNOSIS — E782 Mixed hyperlipidemia: Secondary | ICD-10-CM | POA: Diagnosis not present

## 2015-12-26 DIAGNOSIS — K210 Gastro-esophageal reflux disease with esophagitis: Secondary | ICD-10-CM | POA: Diagnosis not present

## 2015-12-26 DIAGNOSIS — E119 Type 2 diabetes mellitus without complications: Secondary | ICD-10-CM | POA: Diagnosis not present

## 2015-12-26 DIAGNOSIS — I10 Essential (primary) hypertension: Secondary | ICD-10-CM | POA: Diagnosis not present

## 2016-01-07 ENCOUNTER — Other Ambulatory Visit: Payer: Self-pay | Admitting: Nurse Practitioner

## 2016-01-07 DIAGNOSIS — K219 Gastro-esophageal reflux disease without esophagitis: Secondary | ICD-10-CM | POA: Diagnosis not present

## 2016-01-13 ENCOUNTER — Ambulatory Visit: Admission: RE | Admit: 2016-01-13 | Payer: PPO | Source: Ambulatory Visit

## 2016-01-13 ENCOUNTER — Ambulatory Visit
Admission: RE | Admit: 2016-01-13 | Discharge: 2016-01-13 | Disposition: A | Discharge status: Home / Self Care | Payer: PPO | Source: Ambulatory Visit | Attending: Nurse Practitioner | Admitting: Nurse Practitioner

## 2016-01-13 DIAGNOSIS — K219 Gastro-esophageal reflux disease without esophagitis: Secondary | ICD-10-CM | POA: Insufficient documentation

## 2016-01-13 DIAGNOSIS — K209 Esophagitis, unspecified: Secondary | ICD-10-CM | POA: Diagnosis not present

## 2016-02-25 DIAGNOSIS — E119 Type 2 diabetes mellitus without complications: Secondary | ICD-10-CM | POA: Diagnosis not present

## 2016-02-25 DIAGNOSIS — I10 Essential (primary) hypertension: Secondary | ICD-10-CM | POA: Diagnosis not present

## 2016-02-25 DIAGNOSIS — N390 Urinary tract infection, site not specified: Secondary | ICD-10-CM | POA: Diagnosis not present

## 2016-02-25 DIAGNOSIS — E782 Mixed hyperlipidemia: Secondary | ICD-10-CM | POA: Diagnosis not present

## 2016-03-12 ENCOUNTER — Ambulatory Visit: Payer: PPO | Admitting: Internal Medicine

## 2016-03-18 DIAGNOSIS — L853 Xerosis cutis: Secondary | ICD-10-CM | POA: Diagnosis not present

## 2016-03-18 DIAGNOSIS — Z85828 Personal history of other malignant neoplasm of skin: Secondary | ICD-10-CM | POA: Diagnosis not present

## 2016-03-18 DIAGNOSIS — Z872 Personal history of diseases of the skin and subcutaneous tissue: Secondary | ICD-10-CM | POA: Diagnosis not present

## 2016-03-18 DIAGNOSIS — Z08 Encounter for follow-up examination after completed treatment for malignant neoplasm: Secondary | ICD-10-CM | POA: Diagnosis not present

## 2016-03-27 DIAGNOSIS — E782 Mixed hyperlipidemia: Secondary | ICD-10-CM | POA: Diagnosis not present

## 2016-03-27 DIAGNOSIS — E119 Type 2 diabetes mellitus without complications: Secondary | ICD-10-CM | POA: Diagnosis not present

## 2016-03-27 DIAGNOSIS — I10 Essential (primary) hypertension: Secondary | ICD-10-CM | POA: Diagnosis not present

## 2016-03-27 DIAGNOSIS — E039 Hypothyroidism, unspecified: Secondary | ICD-10-CM | POA: Diagnosis not present

## 2016-03-31 DIAGNOSIS — Z23 Encounter for immunization: Secondary | ICD-10-CM | POA: Diagnosis not present

## 2016-03-31 DIAGNOSIS — E782 Mixed hyperlipidemia: Secondary | ICD-10-CM | POA: Diagnosis not present

## 2016-03-31 DIAGNOSIS — I10 Essential (primary) hypertension: Secondary | ICD-10-CM | POA: Diagnosis not present

## 2016-03-31 DIAGNOSIS — E119 Type 2 diabetes mellitus without complications: Secondary | ICD-10-CM | POA: Diagnosis not present

## 2016-03-31 DIAGNOSIS — M5431 Sciatica, right side: Secondary | ICD-10-CM | POA: Diagnosis not present

## 2016-04-15 DIAGNOSIS — M545 Low back pain: Secondary | ICD-10-CM | POA: Diagnosis not present

## 2016-04-17 DIAGNOSIS — M545 Low back pain: Secondary | ICD-10-CM | POA: Diagnosis not present

## 2016-04-20 DIAGNOSIS — M545 Low back pain: Secondary | ICD-10-CM | POA: Diagnosis not present

## 2016-04-21 DIAGNOSIS — E119 Type 2 diabetes mellitus without complications: Secondary | ICD-10-CM | POA: Diagnosis not present

## 2016-04-21 DIAGNOSIS — I10 Essential (primary) hypertension: Secondary | ICD-10-CM | POA: Diagnosis not present

## 2016-04-21 DIAGNOSIS — E782 Mixed hyperlipidemia: Secondary | ICD-10-CM | POA: Diagnosis not present

## 2016-04-24 DIAGNOSIS — M545 Low back pain: Secondary | ICD-10-CM | POA: Diagnosis not present

## 2016-04-27 DIAGNOSIS — M545 Low back pain: Secondary | ICD-10-CM | POA: Diagnosis not present

## 2016-04-30 DIAGNOSIS — M545 Low back pain: Secondary | ICD-10-CM | POA: Diagnosis not present

## 2016-05-08 DIAGNOSIS — M545 Low back pain: Secondary | ICD-10-CM | POA: Diagnosis not present

## 2016-05-11 DIAGNOSIS — M545 Low back pain: Secondary | ICD-10-CM | POA: Diagnosis not present

## 2016-06-29 DIAGNOSIS — Z923 Personal history of irradiation: Secondary | ICD-10-CM

## 2016-06-29 HISTORY — DX: Personal history of irradiation: Z92.3

## 2016-06-30 DIAGNOSIS — I10 Essential (primary) hypertension: Secondary | ICD-10-CM | POA: Diagnosis not present

## 2016-06-30 DIAGNOSIS — E119 Type 2 diabetes mellitus without complications: Secondary | ICD-10-CM | POA: Diagnosis not present

## 2016-06-30 DIAGNOSIS — E785 Hyperlipidemia, unspecified: Secondary | ICD-10-CM | POA: Diagnosis not present

## 2016-07-10 DIAGNOSIS — M5432 Sciatica, left side: Secondary | ICD-10-CM | POA: Diagnosis not present

## 2016-07-10 DIAGNOSIS — L298 Other pruritus: Secondary | ICD-10-CM | POA: Diagnosis not present

## 2016-07-10 DIAGNOSIS — E119 Type 2 diabetes mellitus without complications: Secondary | ICD-10-CM | POA: Diagnosis not present

## 2016-07-10 DIAGNOSIS — E782 Mixed hyperlipidemia: Secondary | ICD-10-CM | POA: Diagnosis not present

## 2016-07-10 DIAGNOSIS — I10 Essential (primary) hypertension: Secondary | ICD-10-CM | POA: Diagnosis not present

## 2016-07-23 DIAGNOSIS — C44629 Squamous cell carcinoma of skin of left upper limb, including shoulder: Secondary | ICD-10-CM | POA: Diagnosis not present

## 2016-07-23 DIAGNOSIS — D485 Neoplasm of uncertain behavior of skin: Secondary | ICD-10-CM | POA: Diagnosis not present

## 2016-07-23 DIAGNOSIS — R208 Other disturbances of skin sensation: Secondary | ICD-10-CM | POA: Diagnosis not present

## 2016-08-10 DIAGNOSIS — E782 Mixed hyperlipidemia: Secondary | ICD-10-CM | POA: Diagnosis not present

## 2016-08-10 DIAGNOSIS — Z23 Encounter for immunization: Secondary | ICD-10-CM | POA: Diagnosis not present

## 2016-08-10 DIAGNOSIS — E119 Type 2 diabetes mellitus without complications: Secondary | ICD-10-CM | POA: Diagnosis not present

## 2016-08-10 DIAGNOSIS — I10 Essential (primary) hypertension: Secondary | ICD-10-CM | POA: Diagnosis not present

## 2016-08-12 DIAGNOSIS — C44629 Squamous cell carcinoma of skin of left upper limb, including shoulder: Secondary | ICD-10-CM | POA: Diagnosis not present

## 2016-09-18 DIAGNOSIS — N390 Urinary tract infection, site not specified: Secondary | ICD-10-CM | POA: Diagnosis not present

## 2016-09-18 DIAGNOSIS — I10 Essential (primary) hypertension: Secondary | ICD-10-CM | POA: Diagnosis not present

## 2016-09-18 DIAGNOSIS — E7800 Pure hypercholesterolemia, unspecified: Secondary | ICD-10-CM | POA: Diagnosis not present

## 2016-09-18 DIAGNOSIS — E119 Type 2 diabetes mellitus without complications: Secondary | ICD-10-CM | POA: Diagnosis not present

## 2016-09-28 DIAGNOSIS — K644 Residual hemorrhoidal skin tags: Secondary | ICD-10-CM | POA: Diagnosis not present

## 2016-09-28 DIAGNOSIS — K648 Other hemorrhoids: Secondary | ICD-10-CM | POA: Diagnosis not present

## 2016-11-05 DIAGNOSIS — E039 Hypothyroidism, unspecified: Secondary | ICD-10-CM | POA: Diagnosis not present

## 2016-11-05 DIAGNOSIS — E7800 Pure hypercholesterolemia, unspecified: Secondary | ICD-10-CM | POA: Diagnosis not present

## 2016-11-05 DIAGNOSIS — I10 Essential (primary) hypertension: Secondary | ICD-10-CM | POA: Diagnosis not present

## 2016-11-05 DIAGNOSIS — E119 Type 2 diabetes mellitus without complications: Secondary | ICD-10-CM | POA: Diagnosis not present

## 2016-11-10 DIAGNOSIS — E119 Type 2 diabetes mellitus without complications: Secondary | ICD-10-CM | POA: Diagnosis not present

## 2016-11-10 DIAGNOSIS — I10 Essential (primary) hypertension: Secondary | ICD-10-CM | POA: Diagnosis not present

## 2016-11-10 DIAGNOSIS — E782 Mixed hyperlipidemia: Secondary | ICD-10-CM | POA: Diagnosis not present

## 2016-11-10 DIAGNOSIS — R110 Nausea: Secondary | ICD-10-CM | POA: Diagnosis not present

## 2016-11-10 DIAGNOSIS — N390 Urinary tract infection, site not specified: Secondary | ICD-10-CM | POA: Diagnosis not present

## 2016-11-19 DIAGNOSIS — L538 Other specified erythematous conditions: Secondary | ICD-10-CM | POA: Diagnosis not present

## 2016-11-19 DIAGNOSIS — X32XXXA Exposure to sunlight, initial encounter: Secondary | ICD-10-CM | POA: Diagnosis not present

## 2016-11-19 DIAGNOSIS — L570 Actinic keratosis: Secondary | ICD-10-CM | POA: Diagnosis not present

## 2016-11-19 DIAGNOSIS — L821 Other seborrheic keratosis: Secondary | ICD-10-CM | POA: Diagnosis not present

## 2016-11-19 DIAGNOSIS — D485 Neoplasm of uncertain behavior of skin: Secondary | ICD-10-CM | POA: Diagnosis not present

## 2016-11-19 DIAGNOSIS — L820 Inflamed seborrheic keratosis: Secondary | ICD-10-CM | POA: Diagnosis not present

## 2016-11-19 DIAGNOSIS — B353 Tinea pedis: Secondary | ICD-10-CM | POA: Diagnosis not present

## 2016-11-19 DIAGNOSIS — Z08 Encounter for follow-up examination after completed treatment for malignant neoplasm: Secondary | ICD-10-CM | POA: Diagnosis not present

## 2016-11-19 DIAGNOSIS — D1801 Hemangioma of skin and subcutaneous tissue: Secondary | ICD-10-CM | POA: Diagnosis not present

## 2016-11-19 DIAGNOSIS — L298 Other pruritus: Secondary | ICD-10-CM | POA: Diagnosis not present

## 2016-11-19 DIAGNOSIS — D0471 Carcinoma in situ of skin of right lower limb, including hip: Secondary | ICD-10-CM | POA: Diagnosis not present

## 2016-11-22 DIAGNOSIS — R531 Weakness: Secondary | ICD-10-CM | POA: Diagnosis not present

## 2016-11-22 DIAGNOSIS — R404 Transient alteration of awareness: Secondary | ICD-10-CM | POA: Diagnosis not present

## 2016-11-23 ENCOUNTER — Observation Stay
Admission: EM | Admit: 2016-11-23 | Discharge: 2016-11-25 | Disposition: A | Discharge status: Home / Self Care | Payer: PPO | Attending: Internal Medicine | Admitting: Internal Medicine

## 2016-11-23 ENCOUNTER — Emergency Department: Payer: PPO

## 2016-11-23 DIAGNOSIS — J449 Chronic obstructive pulmonary disease, unspecified: Secondary | ICD-10-CM | POA: Insufficient documentation

## 2016-11-23 DIAGNOSIS — Z823 Family history of stroke: Secondary | ICD-10-CM | POA: Insufficient documentation

## 2016-11-23 DIAGNOSIS — E010 Iodine-deficiency related diffuse (endemic) goiter: Secondary | ICD-10-CM

## 2016-11-23 DIAGNOSIS — Z8249 Family history of ischemic heart disease and other diseases of the circulatory system: Secondary | ICD-10-CM | POA: Insufficient documentation

## 2016-11-23 DIAGNOSIS — G4733 Obstructive sleep apnea (adult) (pediatric): Secondary | ICD-10-CM | POA: Insufficient documentation

## 2016-11-23 DIAGNOSIS — I10 Essential (primary) hypertension: Secondary | ICD-10-CM | POA: Diagnosis not present

## 2016-11-23 DIAGNOSIS — E890 Postprocedural hypothyroidism: Secondary | ICD-10-CM | POA: Insufficient documentation

## 2016-11-23 DIAGNOSIS — R0600 Dyspnea, unspecified: Secondary | ICD-10-CM

## 2016-11-23 DIAGNOSIS — E669 Obesity, unspecified: Secondary | ICD-10-CM | POA: Insufficient documentation

## 2016-11-23 DIAGNOSIS — R0602 Shortness of breath: Secondary | ICD-10-CM

## 2016-11-23 DIAGNOSIS — R079 Chest pain, unspecified: Secondary | ICD-10-CM

## 2016-11-23 DIAGNOSIS — R0789 Other chest pain: Secondary | ICD-10-CM

## 2016-11-23 DIAGNOSIS — Z9049 Acquired absence of other specified parts of digestive tract: Secondary | ICD-10-CM | POA: Insufficient documentation

## 2016-11-23 DIAGNOSIS — Z882 Allergy status to sulfonamides status: Secondary | ICD-10-CM | POA: Insufficient documentation

## 2016-11-23 DIAGNOSIS — F419 Anxiety disorder, unspecified: Secondary | ICD-10-CM | POA: Insufficient documentation

## 2016-11-23 DIAGNOSIS — I082 Rheumatic disorders of both aortic and tricuspid valves: Secondary | ICD-10-CM | POA: Insufficient documentation

## 2016-11-23 DIAGNOSIS — D3502 Benign neoplasm of left adrenal gland: Secondary | ICD-10-CM | POA: Insufficient documentation

## 2016-11-23 DIAGNOSIS — E039 Hypothyroidism, unspecified: Secondary | ICD-10-CM | POA: Diagnosis not present

## 2016-11-23 DIAGNOSIS — E119 Type 2 diabetes mellitus without complications: Secondary | ICD-10-CM | POA: Diagnosis not present

## 2016-11-23 DIAGNOSIS — Z79899 Other long term (current) drug therapy: Secondary | ICD-10-CM | POA: Insufficient documentation

## 2016-11-23 DIAGNOSIS — Z833 Family history of diabetes mellitus: Secondary | ICD-10-CM | POA: Insufficient documentation

## 2016-11-23 DIAGNOSIS — E871 Hypo-osmolality and hyponatremia: Secondary | ICD-10-CM | POA: Insufficient documentation

## 2016-11-23 DIAGNOSIS — Z91041 Radiographic dye allergy status: Secondary | ICD-10-CM | POA: Insufficient documentation

## 2016-11-23 DIAGNOSIS — Z6839 Body mass index (BMI) 39.0-39.9, adult: Secondary | ICD-10-CM | POA: Insufficient documentation

## 2016-11-23 DIAGNOSIS — Z7984 Long term (current) use of oral hypoglycemic drugs: Secondary | ICD-10-CM | POA: Insufficient documentation

## 2016-11-23 DIAGNOSIS — Z885 Allergy status to narcotic agent status: Secondary | ICD-10-CM | POA: Insufficient documentation

## 2016-11-23 DIAGNOSIS — E785 Hyperlipidemia, unspecified: Secondary | ICD-10-CM | POA: Insufficient documentation

## 2016-11-23 DIAGNOSIS — M48061 Spinal stenosis, lumbar region without neurogenic claudication: Secondary | ICD-10-CM | POA: Diagnosis not present

## 2016-11-23 DIAGNOSIS — Z880 Allergy status to penicillin: Secondary | ICD-10-CM | POA: Insufficient documentation

## 2016-11-23 DIAGNOSIS — K219 Gastro-esophageal reflux disease without esophagitis: Secondary | ICD-10-CM | POA: Diagnosis not present

## 2016-11-23 DIAGNOSIS — E049 Nontoxic goiter, unspecified: Secondary | ICD-10-CM | POA: Insufficient documentation

## 2016-11-23 DIAGNOSIS — I700 Atherosclerosis of aorta: Secondary | ICD-10-CM | POA: Insufficient documentation

## 2016-11-23 DIAGNOSIS — M5136 Other intervertebral disc degeneration, lumbar region: Secondary | ICD-10-CM | POA: Insufficient documentation

## 2016-11-23 DIAGNOSIS — Z888 Allergy status to other drugs, medicaments and biological substances status: Secondary | ICD-10-CM | POA: Insufficient documentation

## 2016-11-23 DIAGNOSIS — Z818 Family history of other mental and behavioral disorders: Secondary | ICD-10-CM | POA: Insufficient documentation

## 2016-11-23 DIAGNOSIS — R002 Palpitations: Secondary | ICD-10-CM

## 2016-11-23 DIAGNOSIS — L509 Urticaria, unspecified: Secondary | ICD-10-CM | POA: Insufficient documentation

## 2016-11-23 DIAGNOSIS — R221 Localized swelling, mass and lump, neck: Secondary | ICD-10-CM

## 2016-11-23 DIAGNOSIS — Z87891 Personal history of nicotine dependence: Secondary | ICD-10-CM | POA: Diagnosis not present

## 2016-11-23 DIAGNOSIS — Z90710 Acquired absence of both cervix and uterus: Secondary | ICD-10-CM | POA: Diagnosis not present

## 2016-11-23 HISTORY — DX: Chest pain, unspecified: R07.9

## 2016-11-23 LAB — COMPREHENSIVE METABOLIC PANEL
ALBUMIN: 4 g/dL (ref 3.5–5.0)
ALK PHOS: 82 U/L (ref 38–126)
ALT: 17 U/L (ref 14–54)
ANION GAP: 9 (ref 5–15)
AST: 26 U/L (ref 15–41)
BUN: 19 mg/dL (ref 6–20)
CALCIUM: 9.6 mg/dL (ref 8.9–10.3)
CO2: 27 mmol/L (ref 22–32)
Chloride: 98 mmol/L — ABNORMAL LOW (ref 101–111)
Creatinine, Ser: 0.71 mg/dL (ref 0.44–1.00)
GFR calc Af Amer: >60 mL/min (ref >60)
GFR calc non Af Amer: >60 mL/min (ref >60)
GLUCOSE: 99 mg/dL (ref 65–99)
POTASSIUM: 3.8 mmol/L (ref 3.5–5.1)
Sodium: 134 mmol/L — ABNORMAL LOW (ref 135–145)
Total Bilirubin: 0.6 mg/dL (ref 0.3–1.2)
Total Protein: 7.3 g/dL (ref 6.5–8.1)

## 2016-11-23 LAB — URINALYSIS, COMPLETE (UACMP) WITH MICROSCOPIC
BILIRUBIN URINE: NEGATIVE
Bacteria, UA: NONE SEEN
Glucose, UA: NEGATIVE mg/dL
Hgb urine dipstick: NEGATIVE
Ketones, ur: NEGATIVE mg/dL
Nitrite: NEGATIVE
PROTEIN: NEGATIVE mg/dL
Specific Gravity, Urine: 1.009 (ref 1.005–1.030)
pH: 7 (ref 5.0–8.0)

## 2016-11-23 LAB — CBC
HEMATOCRIT: 42.4 % (ref 35.0–47.0)
HEMOGLOBIN: 14.6 g/dL (ref 12.0–16.0)
MCH: 31.1 pg (ref 26.0–34.0)
MCHC: 34.5 g/dL (ref 32.0–36.0)
MCV: 90.1 fL (ref 80.0–100.0)
Platelets: 300 10*3/uL (ref 150–440)
RBC: 4.71 MIL/uL (ref 3.80–5.20)
RDW: 13 % (ref 11.5–14.5)
WBC: 10.9 10*3/uL (ref 3.6–11.0)

## 2016-11-23 LAB — LIPASE, BLOOD: Lipase: 39 U/L (ref 11–51)

## 2016-11-23 LAB — MAGNESIUM: Magnesium: 1.7 mg/dL (ref 1.7–2.4)

## 2016-11-23 LAB — TROPONIN I: Troponin I: <0.03 ng/mL (ref <0.03)

## 2016-11-23 LAB — GLUCOSE, CAPILLARY: GLUCOSE-CAPILLARY: 95 mg/dL (ref 65–99)

## 2016-11-23 LAB — FIBRIN DERIVATIVES D-DIMER (ARMC ONLY): Fibrin derivatives D-dimer (ARMC): 972.37 — ABNORMAL HIGH (ref 0.00–499.00)

## 2016-11-23 MED ORDER — IOPAMIDOL (ISOVUE-370) INJECTION 76%
75.0000 mL | Freq: Once | INTRAVENOUS | Status: AC | PRN
  Administered 2016-11-23: 75 mL via INTRAVENOUS

## 2016-11-23 MED ORDER — IPRATROPIUM-ALBUTEROL 0.5-2.5 (3) MG/3ML IN SOLN
3.0000 mL | Freq: Once | RESPIRATORY_TRACT | Status: AC
  Administered 2016-11-23: 3 mL via RESPIRATORY_TRACT
  Filled 2016-11-23: qty 3

## 2016-11-23 MED ORDER — DIPHENHYDRAMINE HCL 50 MG/ML IJ SOLN
25.0000 mg | Freq: Once | INTRAMUSCULAR | Status: AC
  Administered 2016-11-23: 25 mg via INTRAVENOUS
  Filled 2016-11-23: qty 1

## 2016-11-23 MED ORDER — NITROGLYCERIN 0.4 MG SL SUBL
0.4000 mg | SUBLINGUAL_TABLET | SUBLINGUAL | Status: DC | PRN
  Administered 2016-11-23: 0.4 mg via SUBLINGUAL
  Filled 2016-11-23: qty 1

## 2016-11-23 MED ORDER — FENTANYL CITRATE (PF) 100 MCG/2ML IJ SOLN: 25.0000 ug | INTRAMUSCULAR | Status: DC | PRN

## 2016-11-23 MED ORDER — GI COCKTAIL ~~LOC~~
30.0000 mL | Freq: Once | ORAL | Status: AC
  Administered 2016-11-23: 30 mL via ORAL
  Filled 2016-11-23: qty 30

## 2016-11-23 MED ORDER — HYDROCORTISONE NA SUCCINATE PF 100 MG IJ SOLR
50.0000 mg | Freq: Once | INTRAMUSCULAR | Status: AC
  Administered 2016-11-23: 50 mg via INTRAVENOUS
  Filled 2016-11-23: qty 2

## 2016-11-23 NOTE — ED Notes (Signed)
Patient transported to X-ray 

## 2016-11-23 NOTE — ED Notes (Signed)
ED Provider at bedside. 

## 2016-11-23 NOTE — ED Provider Notes (Signed)
[facility] Emergency Department Provider Note    First MD Initiated Contact with Patient 11/23/16 1901     (approximate)  I have reviewed the triage vital signs and the nursing notes.   HISTORY  Chief Complaint Abdominal Pain and Chest Pain    HPI SUDA FORBESS is a 83 y.o. female with history diabetes, hyperlipidemia as well as hypertension who presents with an episode of epigastric discomfort and chest pressure with radiation to her left jaw and bilateral arms that started early this evening. Patient stated that she initially thought she felt like she was hungry and nauseated but she got up perforated started feeling diaphoretic with this kind of discomfort. Of note the patient had a syncopal episode over the weekend but refused transport to the ER. States she's had an episode of vasovagal syncope in the past and feels that this is similar to this episode. Patient states she does have some mild chest discomfort but no shortness of breath at this time.   Past Medical History:  Diagnosis Date  . Diabetes mellitus without complication ([place])   . Hyperlipidemia   . Hypertension   . Sleep apnea   . Thyroid disease    Family History  Problem Relation Age of Onset  . Hypertension Other    Past Surgical History:  Procedure Laterality Date  . ABDOMINAL HYSTERECTOMY    . APPENDECTOMY    . CHOLECYSTECTOMY    . JOINT REPLACEMENT    . THYROIDECTOMY, PARTIAL    . TONSILLECTOMY     Patient Active Problem List   Diagnosis Date Noted  . Hypoxia 11/06/2015  . DDD (degenerative disc disease), lumbar 11/12/2014  . Lumbar canal stenosis 11/12/2014  . Degenerative arthritis of lumbar spine with cord compression 11/12/2014  . Acid reflux 12/19/2013  . HLD (hyperlipidemia) 12/19/2013  . BP (high blood pressure) 12/19/2013  . Apnea, sleep 12/19/2013  . Diabetes mellitus, type 2 ([place]) 05/15/2013  . Adrenal adenoma 10/17/2012  . Intrathoracic goiter 10/17/2012       Prior to Admission medications   Medication Sig Start Date End Date Taking? Authorizing Provider  albuterol (PROVENTIL HFA;VENTOLIN HFA) 108 (90 Base) MCG/ACT inhaler Inhale 2 puffs into the lungs every 6 (six) hours as needed for wheezing or shortness of breath. 12/05/15   Laverle Hobby, MD  Coenzyme Q10 (CO Q 10 PO) Take 1 tablet by mouth daily.    [provider]  diltiazem (TIAZAC) 300 MG 24 hr capsule Take 300 mg by mouth daily.    [provider]  Fluticasone-Salmeterol (ADVAIR DISKUS) 250-50 MCG/DOSE AEPB Inhale 1 puff into the lungs 2 (two) times daily. Patient not taking: Reported on 12/05/2015 11/07/15   Henreitta Leber, MD  levothyroxine (SYNTHROID, LEVOTHROID) 75 MCG tablet Take 75 mcg by mouth daily before breakfast.    [provider]  LORazepam (ATIVAN) 0.5 MG tablet Take 1 tablet (0.5 mg total) by mouth every 8 (eight) hours as needed for anxiety. 12/02/15 12/01/16  Earleen Newport, MD  losartan (COZAAR) 50 MG tablet Take 50 mg by mouth daily.    [provider]  metFORMIN (GLUCOPHAGE) 500 MG tablet Take 500 mg by mouth at bedtime.    [provider]  metoprolol succinate (TOPROL-XL) 50 MG 24 hr tablet Take 50 mg by mouth at bedtime. Take with or immediately following a meal.    [provider]  Multiple Vitamins-Minerals (PRESERVISION AREDS 2) CAPS Take 1 capsule by mouth daily.  [provider]  naproxen sodium (ANAPROX) 220 MG tablet Take 220 mg by mouth 2 (two) times daily as needed (back pain).    [provider]  pravastatin (PRAVACHOL) 40 MG tablet Take 40 mg by mouth at bedtime.  10/30/15   [provider]  ranitidine (ZANTAC) 150 MG tablet Take 1 tablet (150 mg total) by mouth 2 (two) times daily. 11/07/15   Henreitta Leber, MD  sitaGLIPtin (JANUVIA) 50 MG tablet Take 50 mg by mouth daily.    [provider]  triamterene-hydrochlorothiazide (MAXZIDE-25) 37.5-25 MG per  tablet Take 0.5 tablets by mouth daily.    [provider]    Allergies Contrast media [iodinated diagnostic agents]; Sulfur; Codeine; Morphine; Oxycodone; Penicillins; and Sulfa antibiotics    Social History Social History  Substance Use Topics  . Smoking status: Former Research scientist (life sciences)  . Smokeless tobacco: Never Used  . Alcohol use 0.6 - 1.2 oz/week    1 - 2 Standard drinks or equivalent per week     Comment: wine 3 times a week    Review of Systems Patient denies headaches, rhinorrhea, blurry vision, numbness, shortness of breath, chest pain, edema, cough, abdominal pain, nausea, vomiting, diarrhea, dysuria, fevers, rashes or hallucinations unless otherwise stated above in HPI. ____________________________________________   PHYSICAL EXAM:  VITAL SIGNS: Vitals:   11/23/16 2100 11/23/16 2200  BP: (!) 152/91 (!) 130/59  Pulse: 98 (!) 101  Resp: 14 18    Constitutional: Alert and oriented. Well appearing and in no acute distress. Eyes: Conjunctivae are normal.  Head: Atraumatic. Nose: No congestion/rhinnorhea. Mouth/Throat: Mucous membranes are moist.   Neck: No stridor. Painless ROM.  Cardiovascular: Normal rate, regular rhythm. Grossly normal heart sounds.  Good peripheral circulation. Respiratory: Normal respiratory effort.  No retractions. Lungs CTAB. Gastrointestinal: Soft and nontender. No distention. No abdominal bruits. No CVA tenderness. Musculoskeletal: No lower extremity tenderness nor edema.  No joint effusions. Neurologic:  Normal speech and language. No gross focal neurologic deficits are appreciated. No facial droop Skin:  Skin is warm, dry and intact. No rash noted. Psychiatric: Mood and affect are normal. Speech and behavior are normal.  ____________________________________________   LABS (all labs ordered are listed, but only abnormal results are displayed)  Results for orders placed or performed during the hospital encounter of 11/23/16 (from the  past 24 hour(s))  Lipase, blood     Status: None   Collection Time: 11/23/16  7:04 PM  Result Value Ref Range   Lipase 39 11 - 51 U/L  Comprehensive metabolic panel     Status: Abnormal   Collection Time: 11/23/16  7:04 PM  Result Value Ref Range   Sodium 134 (L) 135 - 145 mmol/L   Potassium 3.8 3.5 - 5.1 mmol/L   Chloride 98 (L) 101 - 111 mmol/L   CO2 27 22 - 32 mmol/L   Glucose, Bld 99 65 - 99 mg/dL   BUN 19 6 - 20 mg/dL   Creatinine, Ser 0.71 0.44 - 1.00 mg/dL   Calcium 9.6 8.9 - 10.3 mg/dL   Total Protein 7.3 6.5 - 8.1 g/dL   Albumin 4.0 3.5 - 5.0 g/dL   AST 26 15 - 41 U/L   ALT 17 14 - 54 U/L   Alkaline Phosphatase 82 38 - 126 U/L   Total Bilirubin 0.6 0.3 - 1.2 mg/dL   GFR calc non Af Amer >60 >60 mL/min   GFR calc Af Amer >60 >60 mL/min   Anion gap 9 5 -  15  CBC     Status: None   Collection Time: 11/23/16  7:04 PM  Result Value Ref Range   WBC 10.9 3.6 - 11.0 K/uL   RBC 4.71 3.80 - 5.20 MIL/uL   Hemoglobin 14.6 12.0 - 16.0 g/dL   HCT 42.4 35.0 - 47.0 %   MCV 90.1 80.0 - 100.0 fL   MCH 31.1 26.0 - 34.0 pg   MCHC 34.5 32.0 - 36.0 g/dL   RDW 13.0 11.5 - 14.5 %   Platelets 300 150 - 440 K/uL  Troponin I     Status: None   Collection Time: 11/23/16  7:04 PM  Result Value Ref Range   Troponin I <0.03 <0.03 ng/mL  Magnesium     Status: None   Collection Time: 11/23/16  7:04 PM  Result Value Ref Range   Magnesium 1.7 1.7 - 2.4 mg/dL  Fibrin derivatives D-Dimer ([facility] only)     Status: Abnormal   Collection Time: 11/23/16  7:04 PM  Result Value Ref Range   Fibrin derivatives D-dimer ([facility]) 972.37 (H) 0.00 - 499.00  Glucose, capillary     Status: None   Collection Time: 11/23/16  7:12 PM  Result Value Ref Range   Glucose-Capillary 95 65 - 99 mg/dL  Urinalysis, Complete w Microscopic     Status: Abnormal   Collection Time: 11/23/16  8:14 PM  Result Value Ref Range   Color, Urine YELLOW (A) YELLOW   APPearance CLEAR (A) CLEAR   Specific Gravity, Urine 1.009 1.005  - 1.030   pH 7.0 5.0 - 8.0   Glucose, UA NEGATIVE NEGATIVE mg/dL   Hgb urine dipstick NEGATIVE NEGATIVE   Bilirubin Urine NEGATIVE NEGATIVE   Ketones, ur NEGATIVE NEGATIVE mg/dL   Protein, ur NEGATIVE NEGATIVE mg/dL   Nitrite NEGATIVE NEGATIVE   Leukocytes, UA SMALL (A) NEGATIVE   RBC / HPF 0-5 0 - 5 RBC/hpf   WBC, UA 6-30 0 - 5 WBC/hpf   Bacteria, UA NONE SEEN NONE SEEN   Squamous Epithelial / LPF 0-5 (A) NONE SEEN   Mucous PRESENT    Hyaline Casts, UA PRESENT    Non Squamous Epithelial 0-5 (A) NONE SEEN  Troponin I     Status: None   Collection Time: 11/23/16  8:14 PM  Result Value Ref Range   Troponin I <0.03 <0.03 ng/mL   ____________________________________________  EKG My review and personal interpretation at Time: 19:06   Indication: chest pain  Rate: 101  Rhythm: sinus Axis: normal Other: non specific st changes, no STEMI or st depressions ____________________________________________  RADIOLOGY  I personally reviewed all radiographic images ordered to evaluate for the above acute complaints and reviewed radiology reports and findings.  These findings were personally discussed with the patient.  Please see medical record for radiology report.  ____________________________________________   PROCEDURES  Procedure(s) performed:  Procedures    Critical Care performed: no ____________________________________________   INITIAL IMPRESSION / ASSESSMENT AND PLAN / ED COURSE  Pertinent labs & imaging results that were available during my care of the patient were reviewed by me and considered in my medical decision making (see chart for details).  DDX: ACS, pericarditis, esophagitis, boerhaaves, pe, dissection, pna, bronchitis, costochondritis   LILLAN MCCREADIE is a 83 y.o. who presents to the ED with symptoms as described above. On arrival patient's afebrile and hemodynamically stable. EKG shows some nonspecific changes but no ST elevation.  Lung sounds are clear  bilaterally. Her abdominal exam is soft and  benign.  The patient will be placed on continuous pulse oximetry and telemetry for monitoring.  Laboratory evaluation will be sent to evaluate for the above complaints.     Clinical Course as of Nov 23 2325  Mon Nov 23, 2016  2107 D-dimer is elevated.Will order CT to further evaluate for PE..  [PR]  2231 Patient with persistent pressure and discomfort in her chest. Her abdominal exam is benign at this point. Repeat palpation and is unremarkable.   At this point based on her persistent tachycardia persistent symptoms do feel patient will require CT imaging as well as admission for further evaluation and management [PR]    Clinical Course User Index [PR] Merlyn Lot, MD     ____________________________________________   FINAL CLINICAL IMPRESSION(S) / ED DIAGNOSES  Final diagnoses:  SOB (shortness of breath)  Chest pain      NEW MEDICATIONS STARTED DURING THIS VISIT:  New Prescriptions   No medications on file     Note:  This document was prepared using Dragon voice recognition software and may include unintentional dictation errors.    Merlyn Lot, MD 11/23/16 2328

## 2016-11-23 NOTE — ED Notes (Signed)
Pt ambulatory to toilet to collect urine sample in a hat in toilet. Pt ambulated with steady gait and no assistance.

## 2016-11-23 NOTE — ED Notes (Signed)
Lab stated they do not have a blue tube, - re-drew and sent.  MD notified.

## 2016-11-23 NOTE — ED Triage Notes (Signed)
Pt presents to ED via [facility] from home for epigastric "discomfort" that began a few hours ago that goes up to chest, neck and L jaw. Pt was seen at home for syncope on Saturday by EMS but did not come to ED. States she has these episodes every once in a while and then she is fine after. Pt states she hasn't felt all that well since Saturday. Pt is alert, oriented, moving by self. States SOB but that's normal for her.

## 2016-11-23 NOTE — ED Notes (Signed)
Patient given meal and water

## 2016-11-23 NOTE — ED Notes (Signed)
Patient @ CT

## 2016-11-23 NOTE — ED Notes (Addendum)
Patient refused Nitro and Fentanyl, MD notified

## 2016-11-23 NOTE — ED Notes (Signed)
Pt has hx of acid reflux since EMS.   Pt states today she was really active and hasn't eaten in a while. States she felt clammy, drank some water, and SOB feeling. States began having a pressure in chest, discomfort in arms and back.

## 2016-11-24 ENCOUNTER — Observation Stay
Admit: 2016-11-24 | Discharge: 2016-11-24 | Disposition: A | Discharge status: Home / Self Care | Payer: PPO | Attending: Family Medicine | Admitting: Family Medicine

## 2016-11-24 DIAGNOSIS — R0789 Other chest pain: Secondary | ICD-10-CM | POA: Diagnosis not present

## 2016-11-24 DIAGNOSIS — R0600 Dyspnea, unspecified: Secondary | ICD-10-CM | POA: Diagnosis not present

## 2016-11-24 DIAGNOSIS — R221 Localized swelling, mass and lump, neck: Secondary | ICD-10-CM

## 2016-11-24 DIAGNOSIS — R079 Chest pain, unspecified: Secondary | ICD-10-CM | POA: Diagnosis not present

## 2016-11-24 DIAGNOSIS — R0602 Shortness of breath: Secondary | ICD-10-CM | POA: Diagnosis not present

## 2016-11-24 DIAGNOSIS — E119 Type 2 diabetes mellitus without complications: Secondary | ICD-10-CM | POA: Diagnosis not present

## 2016-11-24 DIAGNOSIS — E049 Nontoxic goiter, unspecified: Secondary | ICD-10-CM

## 2016-11-24 DIAGNOSIS — R002 Palpitations: Secondary | ICD-10-CM | POA: Diagnosis not present

## 2016-11-24 DIAGNOSIS — E039 Hypothyroidism, unspecified: Secondary | ICD-10-CM | POA: Diagnosis not present

## 2016-11-24 LAB — ECHOCARDIOGRAM COMPLETE
HEIGHTINCHES: 59 in
Weight: 3132.8 oz

## 2016-11-24 LAB — CBC
HCT: 37.7 % (ref 35.0–47.0)
Hemoglobin: 13.2 g/dL (ref 12.0–16.0)
MCH: 30.9 pg (ref 26.0–34.0)
MCHC: 35.1 g/dL (ref 32.0–36.0)
MCV: 88.1 fL (ref 80.0–100.0)
PLATELETS: 302 10*3/uL (ref 150–440)
RBC: 4.28 MIL/uL (ref 3.80–5.20)
RDW: 12.8 % (ref 11.5–14.5)
WBC: 9.2 10*3/uL (ref 3.6–11.0)

## 2016-11-24 LAB — TROPONIN I
Troponin I: <0.03 ng/mL (ref <0.03)
Troponin I: <0.03 ng/mL (ref <0.03)
Troponin I: <0.03 ng/mL (ref <0.03)

## 2016-11-24 LAB — LIPID PANEL
CHOL/HDL RATIO: 3.8 ratio
CHOLESTEROL: 146 mg/dL (ref 0–200)
HDL: 38 mg/dL — AB (ref >40)
LDL Cholesterol: 78 mg/dL (ref 0–99)
TRIGLYCERIDES: 152 mg/dL — AB (ref <150)
VLDL: 30 mg/dL (ref 0–40)

## 2016-11-24 LAB — GLUCOSE, CAPILLARY
GLUCOSE-CAPILLARY: 105 mg/dL — AB (ref 65–99)
GLUCOSE-CAPILLARY: 111 mg/dL — AB (ref 65–99)
GLUCOSE-CAPILLARY: 177 mg/dL — AB (ref 65–99)
Glucose-Capillary: 237 mg/dL — ABNORMAL HIGH (ref 65–99)
Glucose-Capillary: 71 mg/dL (ref 65–99)
Glucose-Capillary: 172 mg/dL — ABNORMAL HIGH (ref 65–99)

## 2016-11-24 LAB — BRAIN NATRIURETIC PEPTIDE: B NATRIURETIC PEPTIDE 5: 87 pg/mL (ref 0.0–100.0)

## 2016-11-24 LAB — BASIC METABOLIC PANEL
ANION GAP: 6 (ref 5–15)
BUN: 18 mg/dL (ref 6–20)
CO2: 28 mmol/L (ref 22–32)
Calcium: 9.4 mg/dL (ref 8.9–10.3)
Chloride: 103 mmol/L (ref 101–111)
Creatinine, Ser: 0.72 mg/dL (ref 0.44–1.00)
Glucose, Bld: 76 mg/dL (ref 65–99)
POTASSIUM: 3.7 mmol/L (ref 3.5–5.1)
SODIUM: 137 mmol/L (ref 135–145)

## 2016-11-24 LAB — PROTIME-INR
INR: 0.96
Prothrombin Time: 12.8 seconds (ref 11.4–15.2)

## 2016-11-24 LAB — MAGNESIUM: Magnesium: 1.8 mg/dL (ref 1.7–2.4)

## 2016-11-24 LAB — TSH: TSH: 0.361 u[IU]/mL (ref 0.350–4.500)

## 2016-11-24 MED ORDER — GI COCKTAIL ~~LOC~~
30.0000 mL | Freq: Three times a day (TID) | ORAL | Status: DC | PRN
  Administered 2016-11-24: 30 mL via ORAL
  Filled 2016-11-24 (×2): qty 30

## 2016-11-24 MED ORDER — ASPIRIN 81 MG PO TBEC
81.0000 mg | DELAYED_RELEASE_TABLET | Freq: Every day | ORAL | 3 refills | Status: AC
Start: 2016-11-25 — End: ?

## 2016-11-24 MED ORDER — METOPROLOL SUCCINATE ER 50 MG PO TB24
50.0000 mg | ORAL_TABLET | Freq: Every day | ORAL | Status: DC
  Administered 2016-11-24 (×2): 50 mg via ORAL
  Filled 2016-11-24 (×2): qty 1

## 2016-11-24 MED ORDER — HEPARIN SODIUM (PORCINE) 5000 UNIT/ML IJ SOLN
5000.0000 [IU] | Freq: Three times a day (TID) | INTRAMUSCULAR | Status: DC
  Administered 2016-11-24 – 2016-11-25 (×4): 5000 [IU] via SUBCUTANEOUS
  Filled 2016-11-24 (×4): qty 1

## 2016-11-24 MED ORDER — INSULIN ASPART 100 UNIT/ML ~~LOC~~ SOLN
0.0000 [IU] | Freq: Three times a day (TID) | SUBCUTANEOUS | Status: DC
  Administered 2016-11-25: 4 [IU] via SUBCUTANEOUS
  Filled 2016-11-24: qty 4

## 2016-11-24 MED ORDER — INSULIN ASPART 100 UNIT/ML ~~LOC~~ SOLN
0.0000 [IU] | SUBCUTANEOUS | Status: DC
  Administered 2016-11-24: 7 [IU] via SUBCUTANEOUS
  Administered 2016-11-24: 4 [IU] via SUBCUTANEOUS
  Filled 2016-11-24: qty 4
  Filled 2016-11-24: qty 7

## 2016-11-24 MED ORDER — ISOSORBIDE MONONITRATE ER 30 MG PO TB24
30.0000 mg | ORAL_TABLET | Freq: Every day | ORAL | Status: DC
  Administered 2016-11-24 – 2016-11-25 (×2): 30 mg via ORAL
  Filled 2016-11-24 (×2): qty 1

## 2016-11-24 MED ORDER — ISOSORBIDE MONONITRATE ER 30 MG PO TB24: 30.0000 mg | ORAL_TABLET | Freq: Every day | ORAL | 3 refills | Status: DC

## 2016-11-24 MED ORDER — LOSARTAN POTASSIUM 50 MG PO TABS
50.0000 mg | ORAL_TABLET | Freq: Every day | ORAL | Status: DC
  Administered 2016-11-24 – 2016-11-25 (×2): 50 mg via ORAL
  Filled 2016-11-24 (×2): qty 1

## 2016-11-24 MED ORDER — IPRATROPIUM-ALBUTEROL 0.5-2.5 (3) MG/3ML IN SOLN
3.0000 mL | RESPIRATORY_TRACT | Status: DC
  Administered 2016-11-24 – 2016-11-25 (×5): 3 mL via RESPIRATORY_TRACT
  Filled 2016-11-24 (×6): qty 3

## 2016-11-24 MED ORDER — PANTOPRAZOLE SODIUM 40 MG PO TBEC
40.0000 mg | DELAYED_RELEASE_TABLET | Freq: Every day | ORAL | Status: DC
  Administered 2016-11-24 – 2016-11-25 (×2): 40 mg via ORAL
  Filled 2016-11-24 (×2): qty 1

## 2016-11-24 MED ORDER — TRIAMTERENE-HCTZ 37.5-25 MG PO TABS
0.5000 | ORAL_TABLET | Freq: Every day | ORAL | Status: DC
  Administered 2016-11-24 – 2016-11-25 (×2): 0.5 via ORAL
  Filled 2016-11-24 (×2): qty 0.5

## 2016-11-24 MED ORDER — INSULIN ASPART 100 UNIT/ML ~~LOC~~ SOLN: 0.0000 [IU] | Freq: Three times a day (TID) | SUBCUTANEOUS | Status: DC

## 2016-11-24 MED ORDER — OCUVITE-LUTEIN PO CAPS
1.0000 | ORAL_CAPSULE | Freq: Every day | ORAL | Status: DC
  Administered 2016-11-24 – 2016-11-25 (×2): 1 via ORAL
  Filled 2016-11-24 (×2): qty 1

## 2016-11-24 MED ORDER — DILTIAZEM HCL ER COATED BEADS 300 MG PO CP24
300.0000 mg | ORAL_CAPSULE | Freq: Every day | ORAL | Status: DC
  Administered 2016-11-24 – 2016-11-25 (×2): 300 mg via ORAL
  Filled 2016-11-24 (×2): qty 1

## 2016-11-24 MED ORDER — PRAVASTATIN SODIUM 40 MG PO TABS
40.0000 mg | ORAL_TABLET | Freq: Every day | ORAL | Status: DC
  Administered 2016-11-24 (×2): 40 mg via ORAL
  Filled 2016-11-24 (×2): qty 1

## 2016-11-24 MED ORDER — LEVOTHYROXINE SODIUM 75 MCG PO TABS
75.0000 ug | ORAL_TABLET | Freq: Every day | ORAL | Status: DC
  Administered 2016-11-24 – 2016-11-25 (×2): 75 ug via ORAL
  Filled 2016-11-24 (×2): qty 1

## 2016-11-24 MED ORDER — ONDANSETRON HCL 4 MG/2ML IJ SOLN: 4.0000 mg | Freq: Four times a day (QID) | INTRAMUSCULAR | Status: DC | PRN

## 2016-11-24 MED ORDER — ACETAMINOPHEN 325 MG PO TABS: 650.0000 mg | ORAL_TABLET | ORAL | Status: DC | PRN

## 2016-11-24 MED ORDER — ASPIRIN EC 81 MG PO TBEC
81.0000 mg | DELAYED_RELEASE_TABLET | Freq: Every day | ORAL | Status: DC
  Administered 2016-11-24 – 2016-11-25 (×2): 81 mg via ORAL
  Filled 2016-11-24 (×2): qty 1

## 2016-11-24 MED ORDER — NITROGLYCERIN 0.4 MG SL SUBL: 0.4000 mg | SUBLINGUAL_TABLET | SUBLINGUAL | Status: DC | PRN

## 2016-11-24 MED ORDER — ALBUTEROL SULFATE HFA 108 (90 BASE) MCG/ACT IN AERS: 2.0000 | INHALATION_SPRAY | RESPIRATORY_TRACT | 10 refills | Status: DC | PRN

## 2016-11-24 MED ORDER — LEVOTHYROXINE SODIUM 50 MCG PO TABS: 50.0000 ug | ORAL_TABLET | Freq: Every day | ORAL | 6 refills | Status: DC

## 2016-11-24 MED ORDER — IPRATROPIUM-ALBUTEROL 0.5-2.5 (3) MG/3ML IN SOLN: 3.0000 mL | Freq: Four times a day (QID) | RESPIRATORY_TRACT | Status: DC | PRN

## 2016-11-24 NOTE — Care Management Obs Status (Signed)
MEDICARE OBSERVATION STATUS NOTIFICATION   Patient Details  Name: Kelsey Young MRN: [id number] Date of Birth: Sep 15, 1931   Medicare Observation Status Notification Given:  Yes    Katrina Stack, RN 11/24/2016, 8:44 AM

## 2016-11-24 NOTE — Consult Note (Signed)
[place]  CARDIOLOGY CONSULT NOTE  Patient ID: Kelsey Young MRN: [id number] DOB/AGE: 03-09-1932 83 y.o.  Admit date: 11/23/2016 Referring Physician Dr. Ether Griffins Primary Physician   Primary Cardiologist Dr. Ubaldo Glassing Reason for Consultation Chest pain  HPI: Patient is an 83 year old female with history of diabetes, hypertension, hyperlipidemia, sleep apnea and thyroid disease who was admitted to [facility] after a syncopal episode that occurred during a vasovagal event. She also had an episode of abdominal discomfort and headache associated with hypertension. She presented to the emergency room where she was evaluated. Elective cardiac murmur was unremarkable. Serum troponin was normal and she ruled out for a myocardial infarction. She has had no further chest pain. Echocardiogram was completed which revealed normal LV function with no regional wall motion and maladies. There was no significant valvular disease. She continues to have dyspnea on exertion. She and her daughter state that the patient is under a lot of stress due to illness in the child. She does get somewhat anxious on occasion. He still has some dyspnea on exertion and mild chest tightness.  Review of Systems  Constitutional: Negative.   HENT: Negative.   Eyes: Negative.   Respiratory: Positive for shortness of breath.   Cardiovascular: Positive for chest pain.  Gastrointestinal: Positive for abdominal pain.  Genitourinary: Negative.   Musculoskeletal: Negative.   Skin: Negative.   Neurological: Positive for dizziness and headaches.  Endo/Heme/Allergies: Negative.   Psychiatric/Behavioral: The patient is nervous/anxious.     Past Medical History:  Diagnosis Date  . Diabetes mellitus without complication ([place])   . Hyperlipidemia   . Hypertension   . Sleep apnea   . Thyroid disease     Family History  Problem Relation Age of Onset  . Hypertension Other     Social History    Social History  . Marital status: Widowed    Spouse name: N/A  . Number of children: N/A  . Years of education: N/A   Occupational History  . Not on file.   Social History Main Topics  . Smoking status: Former Research scientist (life sciences)  . Smokeless tobacco: Never Used  . Alcohol use 0.6 - 1.2 oz/week    1 - 2 Standard drinks or equivalent per week     Comment: wine 3 times a week  . Drug use: No  . Sexual activity: Not on file   Other Topics Concern  . Not on file   Social History Narrative  . No narrative on file    Past Surgical History:  Procedure Laterality Date  . ABDOMINAL HYSTERECTOMY    . APPENDECTOMY    . CHOLECYSTECTOMY    . JOINT REPLACEMENT    . THYROIDECTOMY, PARTIAL    . TONSILLECTOMY       Prescriptions Prior to Admission  Medication Sig Dispense Refill Last Dose  . Coenzyme Q10 (CO Q 10 PO) Take 1 tablet by mouth daily.   11/23/2016 at Unknown time  . diltiazem (TIAZAC) 300 MG 24 hr capsule Take 300 mg by mouth daily.   Past Week at Unknown time  . levothyroxine (SYNTHROID, LEVOTHROID) 75 MCG tablet Take 75 mcg by mouth daily before breakfast.   11/23/2016 at Unknown time  . losartan (COZAAR) 50 MG tablet Take 50 mg by mouth daily.   11/23/2016 at Unknown time  . metFORMIN (GLUCOPHAGE) 500 MG tablet Take 500 mg by mouth 2 (two) times daily with a meal.    11/23/2016 at Unknown time  .  metoprolol succinate (TOPROL-XL) 50 MG 24 hr tablet Take 50 mg by mouth at bedtime. Take with or immediately following a meal.   11/23/2016 at Unknown time  . Multiple Vitamins-Minerals (PRESERVISION AREDS 2) CAPS Take 1 capsule by mouth daily.   11/23/2016 at Unknown time  . omeprazole (PRILOSEC) 40 MG capsule Take 40 mg by mouth daily as needed (heartburn).     . pravastatin (PRAVACHOL) 40 MG tablet Take 40 mg by mouth at bedtime.    11/22/2016 at Unknown time  . triamterene-hydrochlorothiazide (MAXZIDE-25) 37.5-25 MG per tablet Take 0.5 tablets by mouth daily.   11/23/2016 at Unknown time  .  [DISCONTINUED] albuterol (PROVENTIL HFA;VENTOLIN HFA) 108 (90 Base) MCG/ACT inhaler Inhale 2 puffs into the lungs every 6 (six) hours as needed for wheezing or shortness of breath. 1 Inhaler 2   . Fluticasone-Salmeterol (ADVAIR DISKUS) 250-50 MCG/DOSE AEPB Inhale 1 puff into the lungs 2 (two) times daily. (Patient not taking: Reported on 12/05/2015) 60 each 1 Not Taking at Unknown time    Physical Exam: Blood pressure (!) 143/51, pulse 67, temperature 98.1 F (36.7 C), temperature source Oral, resp. rate 16, height 4\' 11"  (1.499 m), weight 88.8 kg (195 lb 12.8 oz), SpO2 94 %.   Wt Readings from Last 1 Encounters:  11/24/16 88.8 kg (195 lb 12.8 oz)     General appearance: alert and cooperative Head: Normocephalic, without obvious abnormality, atraumatic Resp: clear to auscultation bilaterally Chest wall: no tenderness Cardio: regular rate and rhythm, S1, S2 normal, no murmur, click, rub or gallop GI: soft, non-tender; bowel sounds normal; no masses,  no organomegaly Extremities: extremities normal, atraumatic, no cyanosis or edema Pulses: 2+ and symmetric Neurologic: Grossly normal  Labs:   Lab Results  Component Value Date   WBC 9.2 11/24/2016   HGB 13.2 11/24/2016   HCT 37.7 11/24/2016   MCV 88.1 11/24/2016   PLT 302 11/24/2016    Recent Labs Lab 11/23/16 1904 11/24/16 0733  NA 134* 137  K 3.8 3.7  CL 98* 103  CO2 27 28  BUN 19 18  CREATININE 0.71 0.72  CALCIUM 9.6 9.4  PROT 7.3  --   BILITOT 0.6  --   ALKPHOS 82  --   ALT 17  --   AST 26  --   GLUCOSE 99 76   Lab Results  Component Value Date   TROPONINI <0.03 11/24/2016      Radiology: No active cardiopulmonary disease EKG: Normal sinus rhythm with no ischemia  ASSESSMENT AND PLAN:  Patient is an 83 year old female with history of hypertension, hyperlipidemia and diabetes who was admitted after presenting to the ER with hypertension, abdominal pain and shortness of breath. She ruled out for infarction. She  also had some bilateral midsternal and epigastric chest pain. There is no radiation to her chest. She ruled out for myocardial infarction. Echocardiogram showed no regional wall motion memory. She still complains of occasional episodic pain that radiates to her chest neck and left jaw. We'll need to evaluate with functional study. Echocardiogram showed no regional wall motion abnormality and no significant valvular abnormalities. We'll proceed with a Lexiscan sestamibi in the morning to further evaluate her ischemic risk. If this is normal with discharge to home. Would continue with current regimen for now. Signed: Teodoro Spray MD, [facility] 11/24/2016, 1:33 PM

## 2016-11-24 NOTE — Care Management (Signed)
Patient placed observation with chest pain.  Troponins are negative.  cardiology consult pending.  Patient presents from home and independent in all adls.  Denies issues accessing medical care, obtaining medications, maintaining housing, utilities and food.   No discharge needs identified at present time.

## 2016-11-24 NOTE — Progress Notes (Signed)
[facility] [place] NAME: Kelsey Young    MR#:  [id number]  DATE OF BIRTH:  [birth year]  SUBJECTIVE:  CHIEF COMPLAINT:   Chief Complaint  Patient presents with  . Abdominal Pain  . Chest Pain  The patient is a 83 year old Caucasian female with medical history significant for history of obesity, diabetes, hypertension, hyperlipidemia, obstructive sleep apnea, hypothyroidism, lumbar spinal stenosis, gastroesophageal reflux disease, who presents to the hospital with complaints of chest tightness, pressure in the lower area of the chest, radiating around the chest associated with burping. She also described radiation to the jaw and both arms associated with anxiety,  palpitations, diaphoresis or shortness of breath. EKG in the emergency room revealed inferior and anterior Q waves, nonspecific ST-T changes. Patient had CT angiogram of the chest, which was essentially unremarkable, however, revealed substernal goiter, neck nodule, COPD. Patient denies significant gastroesophageal reflux, admits of hoarseness, however.  Review of Systems  Constitutional: Positive for malaise/fatigue. Negative for chills, fever and weight loss.  HENT: Negative for congestion.   Eyes: Negative for blurred vision and double vision.  Respiratory: Positive for shortness of breath. Negative for cough, sputum production and wheezing.   Cardiovascular: Positive for chest pain and palpitations. Negative for orthopnea, leg swelling and PND.  Gastrointestinal: Negative for abdominal pain, blood in stool, constipation, diarrhea, nausea and vomiting.  Genitourinary: Negative for dysuria, frequency, hematuria and urgency.  Musculoskeletal: Negative for falls.  Neurological: Negative for dizziness, tremors, focal weakness and headaches.  Endo/Heme/Allergies: Does not bruise/bleed easily.  Psychiatric/Behavioral: Negative for depression. The patient does not have insomnia.       VITAL SIGNS: Blood pressure (!) 129/48, pulse 68, temperature 97.8 F (36.6 C), temperature source Oral, resp. rate 15, height 4\' 11"  (1.499 m), weight 88.8 kg (195 lb 12.8 oz), SpO2 93 %.  PHYSICAL EXAMINATION:   GENERAL:  83 y.o.-year-old patient lying in the bed with no acute distress.  EYES: Pupils equal, round, reactive to light and accommodation. No scleral icterus. Extraocular muscles intact.  HEENT: Head atraumatic, normocephalic. Oropharynx and nasopharynx clear.  NECK:  Supple, no jugular venous distention. No thyroid enlargement, no tenderness.  LUNGS: Normal breath sounds bilaterally, no wheezing, rales,rhonchi or crepitation. No use of accessory muscles of respiration.  CARDIOVASCULAR: S1, S2 normal. No murmurs, rubs, or gallops.  ABDOMEN: Soft, nontender, nondistended. Bowel sounds present. No organomegaly or mass.  EXTREMITIES: No pedal edema, cyanosis, or clubbing.  NEUROLOGIC: Cranial nerves II through XII are intact. Muscle strength 5/5 in all extremities. Sensation intact. Gait not checked.  PSYCHIATRIC: The patient is alert and oriented x 3.  SKIN: No obvious rash, lesion, or ulcer.   ORDERS/RESULTS REVIEWED:   CBC  Recent Labs Lab 11/23/16 1904 11/24/16 0733  WBC 10.9 9.2  HGB 14.6 13.2  HCT 42.4 37.7  PLT 300 302  MCV 90.1 88.1  MCH 31.1 30.9  MCHC 34.5 35.1  RDW 13.0 12.8   ------------------------------------------------------------------------------------------------------------------  Chemistries   Recent Labs Lab 11/23/16 1904 11/24/16 0136 11/24/16 0733  NA 134*  --  137  K 3.8  --  3.7  CL 98*  --  103  CO2 27  --  28  GLUCOSE 99  --  76  BUN 19  --  18  CREATININE 0.71  --  0.72  CALCIUM 9.6  --  9.4  MG 1.7 1.8  --   AST 26  --   --   ALT 17  --   --  ALKPHOS 82  --   --   BILITOT 0.6  --   --    ------------------------------------------------------------------------------------------------------------------ estimated  creatinine clearance is 52.6 mL/min (by C-G formula based on SCr of 0.72 mg/dL). ------------------------------------------------------------------------------------------------------------------  Recent Labs  11/24/16 0136  TSH 0.361    Cardiac Enzymes  Recent Labs Lab 11/23/16 2014 11/24/16 0136 11/24/16 0733  TROPONINI <0.03 <0.03 <0.03   ------------------------------------------------------------------------------------------------------------------ Invalid input(s): POCBNP ---------------------------------------------------------------------------------------------------------------  RADIOLOGY: Dg Chest 2 View  Result Date: 11/23/2016 CLINICAL DATA:  Chest pain. EXAM: CHEST  2 VIEW COMPARISON:  Radiographs of December 02, 2015. FINDINGS: Stable cardiomediastinal silhouette. Atherosclerosis of thoracic aorta is noted. No pneumothorax or pleural effusion is noted. Both lungs are clear. The visualized skeletal structures are unremarkable. IMPRESSION: No active cardiopulmonary disease.  Aortic atherosclerosis. Electronically Signed   By: Marijo Conception, M.D.   On: 11/23/2016 20:06   Ct Angio Chest Pe W Or Wo Contrast  Result Date: 11/23/2016 CLINICAL DATA:  Epigastric discomfort beginning a few hours ago radiating to the chest, neck and left jaw. Chest pain. EXAM: CT ANGIOGRAPHY CHEST WITH CONTRAST TECHNIQUE: Multidetector CT imaging of the chest was performed using the standard protocol during bolus administration of intravenous contrast. Multiplanar CT image reconstructions and MIPs were obtained to evaluate the vascular anatomy. CONTRAST:  Patient was premedicated with a 1 hour emergency prep due to hives and dyspnea after pre seeding iodinated contrast in the past. Patient demonstrated no immediate adverse affects per technologist after the study. 75 cc of Isovue 370 was administered IV. COMPARISON:  Numerous chest CT exams dating back through 02/21/2011. CT neck report from 08/04/2000.  FINDINGS: Cardiovascular: Aortic atherosclerosis. No acute central pulmonary embolus. Top normal-sized cardiac chambers without pericardial effusion. There is coronary arteriosclerosis. Mediastinum/Nodes: Substernal goiter that extends off the lower pole the right thyroid gland and extends along the right paratracheal portion of the mediastinum is again seen currently measuring 5.2 x 3.4 x 3.7 cm and is largely stable in appearance. No pathologically enlarged lymph nodes. Ulcerated soft plaque is seen along the descending thoracic aorta. No dissection. Lungs/Pleura: Centrilobular emphysema with subpleural areas of fibrosis bilaterally. Azygos fissure of incidental note. No pneumonic consolidation or mass. No effusion or pneumothorax. Upper Abdomen: Cortical scarring of the left upper pole of the kidney. Benign adenoma of the left adrenal gland measuring 2 x 1.6 cm. Cholecystectomy. Musculoskeletal: Partially imaged 2 x 2.4 cm nodular density at the base of the neck on the right may represent partial volume averaging of the submandibular gland but is nonspecific and incompletely assessed. If clinically warranted a CT neck may help for further correlation. Review of the MIP images confirms the above findings. IMPRESSION: 1. Partially imaged soft tissue nodular density measuring 2.4 x 2 cm at the base of the neck anteriorly on the right. Findings may represent partial volume averaging of the submandibular gland. Lymph node is not entirely excluded. CT of the neck may help for further correlation. 2. Stable right-sided quarter extending along the right paratracheal portion of the mediastinum measuring 5.2 x 3.4 x 3.7 cm currently. 3. Stable benign left adrenal adenoma measuring 2 x 1.6 cm. 4. Cholecystectomy. 5. No acute pulmonary embolus.  COPD. Electronically Signed   By: Ashley Royalty M.D.   On: 11/23/2016 23:28    EKG:  Orders placed or performed during the hospital encounter of 11/23/16  . EKG 12-Lead  . EKG  12-Lead  . EKG 12-lead  . EKG 12-Lead  . EKG 12-Lead  ASSESSMENT AND PLAN:  Active Problems:   Chest pain, rule out acute myocardial infarction   Chest tightness or pressure   Dyspnea   Neck nodule   Substernal goiter  #1. Chest pressure and tightness with radiation to the jaw and arms, concerning for underlying coronary artery disease, no acute coronary syndrome however, as cardiac enzymes were negative 3, awaiting for cardiology's input, echocardiogram is pending, continue patient on aspirin and Imdur, ambulate,and follow clinically.  #2. Dyspnea, suspect COPD related, continue patient on duo nebs, steroid inhalers, following clinically, check O2 sats on room air on exertion #3. Substernal goiter, patient is to continue therapy, she may benefit from endocrinologist evaluation as outpatient. TSH was 0.36, could be a little bit too low, may benefit from a lower Synthroid dose, new prescription for 50 g daily dose of Synthroid was written upon discharge #4. Neck nodule, patient would need to have outpatient evaluation/testing, including recommended CT of the neck #5. Hyponatremia, resolved 6. Palpitations, patient remains in sinus rhythm, no arrhythmias were noted #7. Questionable gastroesophageal reflux disease with hoarseness, patient was advised to keep head of bed elevated at 30, continue PPI   Management plans discussed with the patient, family and they are in agreement.   DRUG ALLERGIES:  Allergies  Allergen Reactions  . Contrast Media [Iodinated Diagnostic Agents] Hives and Shortness Of Breath    Restless   . Sulfur Hives and Shortness Of Breath  . Codeine Nausea And Vomiting  . Morphine Nausea And Vomiting  . Oxycodone Nausea And Vomiting    dizzy  . Penicillins Other (See Comments)    "Hardened lump" Has patient had a PCN reaction causing immediate rash, facial/tongue/throat swelling, SOB or lightheadedness with hypotension: No Has patient had a PCN reaction  causing severe rash involving mucus membranes or skin necrosis: No Has patient had a PCN reaction that required hospitalization No Has patient had a PCN reaction occurring within the last 10 years: No If all of the above answers are "NO", then may proceed with Cephalosporin use.    . Sulfa Antibiotics Rash    CODE STATUS:     Code Status Orders        Start     Ordered   11/24/16 0058  Full code  Continuous     11/24/16 0057    Code Status History    Date Active Date Inactive Code Status Order ID Comments User Context   11/06/2015  5:03 AM 11/07/2015  8:29 PM Full Code [id number]  Harrie Foreman, MD ED      TOTAL TIME TAKING CARE OF THIS PATIENT: 40  minutes.    Theodoro Grist M.D on 11/24/2016 at 12:06 PM  Between 7am to 6pm - Pager - [phone]  After 6pm go to [email or web address] - password [email or web address] [facility]  Office  [phone]  CC: Primary care physician; Perrin Maltese, MD

## 2016-11-24 NOTE — H&P (Addendum)
[place] @ [facility] Admission History and Physical Harvie Bridge, D.O.  ---------------------------------------------------------------------------------------------------------------------   PATIENT NAME: Kelsey Young MR#: [id number] DATE OF BIRTH: Jan 28, 1932 DATE OF ADMISSION: 11/23/2016 PRIMARY CARE PHYSICIAN: Perrin Maltese, MD  REQUESTING/REFERRING PHYSICIAN: ED Dr. Quentin Cornwall  CHIEF COMPLAINT: Chief Complaint  Patient presents with  . Abdominal Pain  . Chest Pain    HISTORY OF PRESENT ILLNESS: Kelsey Young is a 83 y.o. female with a known history of Diabetes, hypertension, hyperlipidemia, sleep apnea, hypothyroidism, lumbar spinal stenosis, GERD, adrenal adenoma presents to the emergency department for evaluation of chest pain.  Patient was in a usual state of health until 3 days ago when she she sustained a vagal syncopal event 3 days ago which she related to low blood sugar. She describes the onset of epigastric tightness that radiated in a band around her chest and was associated with burping. Patient states that since then she has been generally feeling unwell. Today she reports sudden worsening of her epigastric discomfort with radiation to her jaw and both arms. Her symptoms were associated with anxiety, palpitations, diaphoresis and shortness of breath.  Patient complains of chronic shortness of breath, worse with exertion and she believes is associated with her lumbar back pain.  Otherwise there has been no change in status. Patient has been taking medication as prescribed and there has been no recent change in medication or diet.  There has been no recent illness, travel or sick contacts.   Patient denies fevers/chills, weakness, dizziness, shortness of breath, N/V/C/D, abdominal pain, dysuria/frequency, changes in mental status.   EMS/ED COURSE:   Patient received Benadryl, GI cocktail, duo nebs. She also received Solu-Cortef to premedicate for IV contrast  for CT scan.  PAST MEDICAL HISTORY: Past Medical History:  Diagnosis Date  . Diabetes mellitus without complication ([place])   . Hyperlipidemia   . Hypertension   . Sleep apnea   . Thyroid disease   Diabetes, hypertension, hyperlipidemia, sleep apnea, hypothyroidism, lumbar spinal stenosis, GERD, adrenal adenoma    PAST SURGICAL HISTORY: Past Surgical History:  Procedure Laterality Date  . ABDOMINAL HYSTERECTOMY    . APPENDECTOMY    . CHOLECYSTECTOMY    . JOINT REPLACEMENT    . THYROIDECTOMY, PARTIAL    . TONSILLECTOMY        SOCIAL HISTORY: Social History  Substance Use Topics  . Smoking status: Former Research scientist (life sciences)  . Smokeless tobacco: Never Used  . Alcohol use 0.6 - 1.2 oz/week    1 - 2 Standard drinks or equivalent per week     Comment: wine 3 times a week    Family History   Medical History Relation Name Comments  Diabetes Father    Myocardial Infarction (Heart attack) Father    Depression Mother    Heart failure Mother    Stroke Mother       MEDICATIONS AT HOME: Prior to Admission medications   Medication Sig Start Date End Date Taking? Authorizing Provider  albuterol (PROVENTIL HFA;VENTOLIN HFA) 108 (90 Base) MCG/ACT inhaler Inhale 2 puffs into the lungs every 6 (six) hours as needed for wheezing or shortness of breath. 12/05/15  Yes Laverle Hobby, MD  Coenzyme Q10 (CO Q 10 PO) Take 1 tablet by mouth daily.   Yes [provider]  diltiazem (TIAZAC) 300 MG 24 hr capsule Take 300 mg by mouth daily.   Yes [provider]  levothyroxine (SYNTHROID, LEVOTHROID) 75 MCG tablet Take 75 mcg by mouth daily before breakfast.  Yes [provider]  losartan (COZAAR) 50 MG tablet Take 50 mg by mouth daily.   Yes [provider]  metFORMIN (GLUCOPHAGE) 500 MG tablet Take 500 mg by mouth 2 (two) times daily with a meal.    Yes [provider]  metoprolol succinate (TOPROL-XL) 50 MG 24 hr tablet Take 50 mg by mouth at  bedtime. Take with or immediately following a meal.   Yes [provider]  Multiple Vitamins-Minerals (PRESERVISION AREDS 2) CAPS Take 1 capsule by mouth daily.   Yes [provider]  omeprazole (PRILOSEC) 40 MG capsule Take 40 mg by mouth daily as needed (heartburn).   Yes [provider]  pravastatin (PRAVACHOL) 40 MG tablet Take 40 mg by mouth at bedtime.  10/30/15  Yes [provider]  triamterene-hydrochlorothiazide (MAXZIDE-25) 37.5-25 MG per tablet Take 0.5 tablets by mouth daily.   Yes [provider]  Fluticasone-Salmeterol (ADVAIR DISKUS) 250-50 MCG/DOSE AEPB Inhale 1 puff into the lungs 2 (two) times daily. Patient not taking: Reported on 12/05/2015 11/07/15   Henreitta Leber, MD      DRUG ALLERGIES: Allergies  Allergen Reactions  . Contrast Media [Iodinated Diagnostic Agents] Hives and Shortness Of Breath    Restless   . Sulfur Hives and Shortness Of Breath  . Codeine Nausea And Vomiting  . Morphine Nausea And Vomiting  . Oxycodone Nausea And Vomiting    dizzy  . Penicillins Other (See Comments)    "Hardened lump" Has patient had a PCN reaction causing immediate rash, facial/tongue/throat swelling, SOB or lightheadedness with hypotension: No Has patient had a PCN reaction causing severe rash involving mucus membranes or skin necrosis: No Has patient had a PCN reaction that required hospitalization No Has patient had a PCN reaction occurring within the last 10 years: No If all of the above answers are "NO", then may proceed with Cephalosporin use.    . Sulfa Antibiotics Rash     REVIEW OF SYSTEMS: CONSTITUTIONAL: No fatigue, weakness, fever, chills, weight gain/loss, headache EYES: No blurry or double vision. ENT: No tinnitus, postnasal drip, redness or soreness of the oropharynx. RESPIRATORY: No dyspnea, cough, wheeze, hemoptysis. CARDIOVASCULAR: Positive chest pain, negative orthopnea, palpitations,  syncope. GASTROINTESTINAL:Positive epigastric pain, belching, gas No nausea, vomiting, constipation, diarrhea, abdominal pain. No hematemesis, melena or hematochezia. GENITOURINARY: No dysuria, frequency, hematuria. ENDOCRINE: No polyuria or nocturia. No heat or cold intolerance. HEMATOLOGY: No anemia, bruising, bleeding. INTEGUMENTARY: No rashes, ulcers, lesions. MUSCULOSKELETAL: No pain, arthritis, swelling, gout. NEUROLOGIC: No numbness, tingling, weakness or ataxia. No seizure-type activity. PSYCHIATRIC: No anxiety, depression, insomnia.  PHYSICAL EXAMINATION: VITAL SIGNS: Blood pressure (!) 155/50, pulse 97, temperature 97.7 F (36.5 C), temperature source Oral, resp. rate 18, height 4\' 11"  (1.499 m), weight 93 kg (205 lb), SpO2 95 %.  GENERAL: 83 y.o.-year-old white female patient, well-developed, well-nourished lying in the bed in no acute distress.  Anxious HEENT: Head atraumatic, normocephalic. Pupils equal, round, reactive to light and accommodation. No scleral icterus. Extraocular muscles intact. Oropharynx is clear. Mucus membranes moist. NECK: Supple, full range of motion. No JVD, no bruit heard. No cervical lymphadenopathy. CHEST: Decreased breath sounds at the left base, otherwise normal breath sounds No wheezing, rales, rhonchi or crackles. No use of accessory muscles of respiration.  No reproducible chest wall tenderness.  CARDIOVASCULAR: S1, S2 normal. No murmurs, rubs, or gallops appreciated. Cap refill <2 seconds. ABDOMEN: Soft, nontender, nondistended. No rebound, guarding, rigidity. Normoactive bowel sounds present in all four quadrants. No organomegaly or  mass. EXTREMITIES: Full range of motion. No pedal edema, cyanosis, or clubbing. NEUROLOGIC: Cranial nerves II through XII are grossly intact with no focal sensorimotor deficit. Muscle strength 5/5 in all extremities. Sensation intact. Gait not checked. PSYCHIATRIC: The patient is alert and oriented x 3. Normal affect,  mood, thought content. SKIN: Warm, dry, and intact without obvious rash, lesion, or ulcer.  LABORATORY PANEL:  CBC  Recent Labs Lab 11/23/16 1904  WBC 10.9  HGB 14.6  HCT 42.4  PLT 300   ----------------------------------------------------------------------------------------------------------------- Chemistries  Recent Labs Lab 11/23/16 1904  NA 134*  K 3.8  CL 98*  CO2 27  GLUCOSE 99  BUN 19  CREATININE 0.71  CALCIUM 9.6  MG 1.7  AST 26  ALT 17  ALKPHOS 82  BILITOT 0.6   ------------------------------------------------------------------------------------------------------------------ Cardiac Enzymes  Recent Labs Lab 11/23/16 2014  TROPONINI <0.03   ------------------------------------------------------------------------------------------------------------------  RADIOLOGY: Dg Chest 2 View  Result Date: 11/23/2016 CLINICAL DATA:  Chest pain. EXAM: CHEST  2 VIEW COMPARISON:  Radiographs of December 02, 2015. FINDINGS: Stable cardiomediastinal silhouette. Atherosclerosis of thoracic aorta is noted. No pneumothorax or pleural effusion is noted. Both lungs are clear. The visualized skeletal structures are unremarkable. IMPRESSION: No active cardiopulmonary disease.  Aortic atherosclerosis. Electronically Signed   By: Marijo Conception, M.D.   On: 11/23/2016 20:06   Ct Angio Chest Pe W Or Wo Contrast  Result Date: 11/23/2016 CLINICAL DATA:  Epigastric discomfort beginning a few hours ago radiating to the chest, neck and left jaw. Chest pain. EXAM: CT ANGIOGRAPHY CHEST WITH CONTRAST TECHNIQUE: Multidetector CT imaging of the chest was performed using the standard protocol during bolus administration of intravenous contrast. Multiplanar CT image reconstructions and MIPs were obtained to evaluate the vascular anatomy. CONTRAST:  Patient was premedicated with a 1 hour emergency prep due to hives and dyspnea after pre seeding iodinated contrast in the past. Patient demonstrated no  immediate adverse affects per technologist after the study. 75 cc of Isovue 370 was administered IV. COMPARISON:  Numerous chest CT exams dating back through 02/21/2011. CT neck report from 08/04/2000. FINDINGS: Cardiovascular: Aortic atherosclerosis. No acute central pulmonary embolus. Top normal-sized cardiac chambers without pericardial effusion. There is coronary arteriosclerosis. Mediastinum/Nodes: Substernal goiter that extends off the lower pole the right thyroid gland and extends along the right paratracheal portion of the mediastinum is again seen currently measuring 5.2 x 3.4 x 3.7 cm and is largely stable in appearance. No pathologically enlarged lymph nodes. Ulcerated soft plaque is seen along the descending thoracic aorta. No dissection. Lungs/Pleura: Centrilobular emphysema with subpleural areas of fibrosis bilaterally. Azygos fissure of incidental note. No pneumonic consolidation or mass. No effusion or pneumothorax. Upper Abdomen: Cortical scarring of the left upper pole of the kidney. Benign adenoma of the left adrenal gland measuring 2 x 1.6 cm. Cholecystectomy. Musculoskeletal: Partially imaged 2 x 2.4 cm nodular density at the base of the neck on the right may represent partial volume averaging of the submandibular gland but is nonspecific and incompletely assessed. If clinically warranted a CT neck may help for further correlation. Review of the MIP images confirms the above findings. IMPRESSION: 1. Partially imaged soft tissue nodular density measuring 2.4 x 2 cm at the base of the neck anteriorly on the right. Findings may represent partial volume averaging of the submandibular gland. Lymph node is not entirely excluded. CT of the neck may help for further correlation. 2. Stable right-sided quarter extending along the right paratracheal portion of the mediastinum measuring  5.2 x 3.4 x 3.7 cm currently. 3. Stable benign left adrenal adenoma measuring 2 x 1.6 cm. 4. Cholecystectomy. 5. No acute  pulmonary embolus.  COPD. Electronically Signed   By: Ashley Royalty M.D.   On: 11/23/2016 23:28    EKG: Sinus tachycardia at 101 bpm with normal axis and nonspecific ST-T wave changes.   IMPRESSION AND PLAN:  This is a 83 y.o. female with a history of Diabetes, hypertension, hyperlipidemia, sleep apnea, hypothyroidism, lumbar spinal stenosis, GERD, adrenal adenoma now being admitted with:  1. Chest pain, rule out ACS - Admit to observation with telemetry monitoring. - Trend troponins, check lipids and TSH. - Morphine, nitro, ordered for pain control - Continue metoprolol, pravastatin, aspirin - NPO after midnight - Check echo - Cardiology consult requested.  Dr. Ubaldo Glassing  2. Indigestion - Continue Maalox PRN  3.  SOB, CT c/w COPD. Patient has been diagnosed in the past but is not treated.   - O2 and mednebs as needed - Will need outpatient follow up for further workup.  Patient was previously on Advair.  - CT also showed soft tissue mass at the base of the right neck. Suggested dedicated CT neck. (patient allergic to IV contrast).  4. History of GERD - Continue Protonix for Prilosec  5. H/o Diabetes - Accuchecks q4h with RISS coverage - NPO after midnight -Hold metformin  6. History of hypertension -Continue metoprolol, diltiazem, Cozaar, Maxzide  7. History of hypothyroidism -Continue Synthroid  Admission status: Observation, telemetry Diet/Nutrition: Heart healthy Fluids: HL Consults: Dr. Ubaldo Glassing DVT Px: Heparin, SCDs and early ambulation Code Status: Full Disposition Plan: To home in <24 hours  All the records are reviewed and case discussed with ED provider. Management plans discussed with the patient and/or family who express understanding and agree with plan of care.   TOTAL TIME TAKING CARE OF THIS PATIENT: 60 minutes.   Kelsey Young D.O. on 11/24/2016 at 1:22 AM Between 7am to 6pm - Pager - [phone] After 6pm go to [email or web address] - [facility] Office [phone] CC: Primary care physician; Perrin Maltese, MD     Note: This dictation was prepared with Dragon dictation along with smaller phrase technology. Any transcriptional errors that result from this process are unintentional.

## 2016-11-24 NOTE — Progress Notes (Signed)
Pt had medicine due, but pt NPO. Notified MD. May give with sip of water.

## 2016-11-24 NOTE — Progress Notes (Signed)
*  PRELIMINARY RESULTS* Echocardiogram 2D Echocardiogram has been performed.  Sherrie Sport 11/24/2016, 11:38 AM

## 2016-11-24 NOTE — Progress Notes (Signed)
Pt arrived via stretcher from ED with family member by side. Pt A&O. Telemetry monitor appled and called to CCMD. Yellow socks placed, fall contract signed.

## 2016-11-24 NOTE — ED Notes (Signed)
Patient's EKG tubed up to 2A

## 2016-11-24 NOTE — Plan of Care (Signed)
Problem: Safety: Goal: Ability to remain free from injury will improve Outcome: Progressing Fall precautions in place, non skid socks when oob  Problem: Pain Managment: Goal: General experience of comfort will improve Outcome: Progressing Prn medications  Problem: Tissue Perfusion: Goal: Risk factors for ineffective tissue perfusion will decrease Outcome: Progressing SQ Heparin  Problem: Cardiac: Goal: Ability to achieve and maintain adequate cardiovascular perfusion will improve Outcome: Progressing Remains on heart monitor, will continue to monitor  Problem: Education: Goal: Understanding of cardiac disease, CV risk reduction, and recovery process will improve Outcome: Progressing Scheduled for 2Decho today and cardiac consult

## 2016-11-25 ENCOUNTER — Telehealth: Payer: Self-pay | Admitting: Internal Medicine

## 2016-11-25 ENCOUNTER — Observation Stay: Payer: PPO

## 2016-11-25 DIAGNOSIS — K219 Gastro-esophageal reflux disease without esophagitis: Secondary | ICD-10-CM

## 2016-11-25 DIAGNOSIS — E871 Hypo-osmolality and hyponatremia: Secondary | ICD-10-CM

## 2016-11-25 DIAGNOSIS — R0600 Dyspnea, unspecified: Secondary | ICD-10-CM | POA: Diagnosis not present

## 2016-11-25 DIAGNOSIS — R002 Palpitations: Secondary | ICD-10-CM | POA: Diagnosis not present

## 2016-11-25 DIAGNOSIS — E039 Hypothyroidism, unspecified: Secondary | ICD-10-CM | POA: Diagnosis not present

## 2016-11-25 DIAGNOSIS — R079 Chest pain, unspecified: Secondary | ICD-10-CM | POA: Diagnosis not present

## 2016-11-25 DIAGNOSIS — R0789 Other chest pain: Secondary | ICD-10-CM | POA: Diagnosis not present

## 2016-11-25 HISTORY — DX: Hypo-osmolality and hyponatremia: E87.1

## 2016-11-25 HISTORY — DX: Palpitations: R00.2

## 2016-11-25 LAB — NM MYOCAR MULTI W/SPECT W/WALL MOTION / EF
CHL CUP NUCLEAR SDS: 3
CHL CUP NUCLEAR SRS: 5
CHL CUP STRESS STAGE 1 HR: 75 {beats}/min
CHL CUP STRESS STAGE 2 HR: 75 {beats}/min
CHL CUP STRESS STAGE 2 SPEED: 0 mph
CHL CUP STRESS STAGE 4 SPEED: 0 mph
CHL CUP STRESS STAGE 5 DBP: 37 mmHg
CHL CUP STRESS STAGE 5 GRADE: 0 %
CHL CUP STRESS STAGE 5 HR: 91 {beats}/min
CHL CUP STRESS STAGE 5 SBP: 124 mmHg
CSEPED: 1 min
CSEPEDS: 0 s
CSEPHR: 69 %
CSEPPMHR: 65 %
Estimated workload: 1 METS
LV dias vol: 64 mL (ref 46–106)
LV sys vol: 20 mL
MPHR: 138 {beats}/min
Peak HR: 90 {beats}/min
Rest HR: 68 {beats}/min
SSS: 5
Stage 1 Grade: 0 %
Stage 1 Speed: 0 mph
Stage 2 Grade: 0 %
Stage 3 Grade: 0 %
Stage 3 HR: 90 {beats}/min
Stage 3 Speed: 0 mph
Stage 4 Grade: 0 %
Stage 4 HR: 96 {beats}/min
Stage 5 Speed: 0 mph
TID: 1.06

## 2016-11-25 LAB — HEMOGLOBIN A1C
Hgb A1c MFr Bld: 6.3 % — ABNORMAL HIGH (ref 4.8–5.6)
Mean Plasma Glucose: 134 mg/dL

## 2016-11-25 LAB — GLUCOSE, CAPILLARY: Glucose-Capillary: 198 mg/dL — ABNORMAL HIGH (ref 65–99)

## 2016-11-25 MED ORDER — TECHNETIUM TC 99M TETROFOSMIN IV KIT
13.9100 | PACK | Freq: Once | INTRAVENOUS | Status: AC | PRN
  Administered 2016-11-25: 13.91 via INTRAVENOUS

## 2016-11-25 MED ORDER — REGADENOSON 0.4 MG/5ML IV SOLN
0.4000 mg | Freq: Once | INTRAVENOUS | Status: AC
  Administered 2016-11-25: 0.4 mg via INTRAVENOUS

## 2016-11-25 MED ORDER — METHYLPREDNISOLONE SODIUM SUCC 125 MG IJ SOLR
60.0000 mg | INTRAMUSCULAR | Status: DC
  Administered 2016-11-25: 60 mg via INTRAVENOUS
  Filled 2016-11-25: qty 2

## 2016-11-25 MED ORDER — DILTIAZEM HCL ER COATED BEADS 300 MG PO CP24: 300.0000 mg | ORAL_CAPSULE | Freq: Every day | ORAL | 0 refills | Status: DC

## 2016-11-25 MED ORDER — IPRATROPIUM-ALBUTEROL 0.5-2.5 (3) MG/3ML IN SOLN: 3.0000 mL | RESPIRATORY_TRACT | 12 refills | Status: DC

## 2016-11-25 MED ORDER — BUDESONIDE 0.5 MG/2ML IN SUSP
0.5000 mg | Freq: Two times a day (BID) | RESPIRATORY_TRACT | Status: DC
  Administered 2016-11-25: 0.5 mg via RESPIRATORY_TRACT
  Filled 2016-11-25: qty 2

## 2016-11-25 MED ORDER — PREDNISONE 10 MG PO TABS: 10.0000 mg | ORAL_TABLET | Freq: Every day | ORAL | 0 refills | Status: DC

## 2016-11-25 MED ORDER — TECHNETIUM TC 99M TETROFOSMIN IV KIT
31.1100 | PACK | Freq: Once | INTRAVENOUS | Status: AC | PRN
  Administered 2016-11-25: 31.11 via INTRAVENOUS

## 2016-11-25 NOTE — Progress Notes (Signed)
Discharge instructions explained to pt/ verbalized an understanding/ iv and tele removed/ rx given to pt/ will transport off unit via wheelchair when her ride arrives

## 2016-11-25 NOTE — Telephone Encounter (Signed)
Needs 1 week hosp f/u. Please advise

## 2016-11-25 NOTE — Telephone Encounter (Signed)
Per DR, schedule pt in 2-4 weeks with available provider if DR not available in 30 minute slot for hosp f/u. Thanks.

## 2016-11-25 NOTE — Telephone Encounter (Signed)
Pt is scheduled for 12/23/16

## 2016-11-25 NOTE — Discharge Summary (Signed)
[facility] [place] NAME: Kelsey Young    MR#:  [id number]  DATE OF BIRTH:  [birth year]  DATE OF ADMISSION:  11/23/2016 ADMITTING PHYSICIAN: Ubaldo Glassing Hugelmeyer, DO  DATE OF DISCHARGE: No discharge date for patient encounter.  PRIMARY CARE PHYSICIAN: Perrin Maltese, MD     ADMISSION DIAGNOSIS:  SOB (shortness of breath) [R06.02] Chest pain [R07.9]  DISCHARGE DIAGNOSIS:  Active Problems:   Chest pain, rule out acute myocardial infarction   Chest tightness or pressure   Dyspnea   Neck nodule   Substernal goiter   Hyponatremia   Palpitations   Chronic GERD   SECONDARY DIAGNOSIS:   Past Medical History:  Diagnosis Date  . Diabetes mellitus without complication ([place])   . Hyperlipidemia   . Hypertension   . Sleep apnea   . Thyroid disease     .pro HOSPITAL COURSE:  The patient is a 83 year old Caucasian female with medical history significant for history of obesity, diabetes, hypertension, hyperlipidemia, obstructive sleep apnea, hypothyroidism, lumbar spinal stenosis, gastroesophageal reflux disease, who presents to the hospital with complaints of chest tightness, pressure in the lower area of the chest, radiating around the chest associated with burping. She also described radiation to the jaw and both arms associated with anxiety,  palpitations, diaphoresis or shortness of breath. EKG in the emergency room revealed inferior and anterior Q waves, nonspecific ST-T changes. Patient had CT angiogram of the chest, which was essentially unremarkable, however, revealed substernal goiter, neck nodule, COPD. Patient denied significant gastroesophageal reflux, admited of hoarseness, however. The patient was seen by cardiologist, who recommended Myoview stress test in the morning. Patient underwent Myoview stress test which revealed essentially normal study. Patient was treated with nebulizing therapy and improved clinically. It was  felt that patient's chest pressure was related to her asthma/COPD. Nebulizing therapy was prescribed for home Discussion by problem: #1. Chest pressure and tightness with radiation to the jaw and arms,  cardiac enzymes were negative 3,  echocardiogram showed normal ejection fraction, mild aortic regurgitation, Myoview stress test  was normal, it was felt that patient's chest pressure was not cardiac, the patient was ambulated and discharged home. She is to follow-up with her primary care physician within one week after discharge   #2. Dyspnea, suspect COPD related, continue patient on duo nebs, steroid inhalers, clinically improved. The patient is to follow-up with pulmonologist, Dr. Ashby Dawes as outpatient, discussed with Dr. Ashby Dawes  #3. Substernal goiter, patient is to continue therapy, she may benefit from endocrinologist evaluation as outpatient. TSH was , found to be lower at 0.36,  new prescription for 50 g daily dose of Synthroid was written upon discharge, check TSH within a few weeks after discharge #4. Neck nodule, patient would need to have outpatient evaluation/testing, including recommended CT of the neck #5. Hyponatremia, resolved 6. Palpitations, patient remained in sinus rhythm, no arrhythmias were noted, Synthroid dose was decreased to 50 g daily dose, follow clinically  #7.  gastroesophageal reflux disease with hoarseness, patient was advised to keep head of bed elevated at 30, continue PPI  #8. Diabetes mellitus type 2 with hemoglobin A1c 6.3, continue Glucophage, follow blood glucose levels as outpatient closely   DISCHARGE CONDITIONS:   Stable   CONSULTS OBTAINED:  Treatment Team:  Teodoro Spray, MD  DRUG ALLERGIES:   Allergies  Allergen Reactions  . Contrast Media [Iodinated Diagnostic Agents] Hives and Shortness Of Breath    Restless   .  Sulfur Hives and Shortness Of Breath  . Codeine Nausea And Vomiting  . Morphine Nausea And Vomiting  . Oxycodone  Nausea And Vomiting    dizzy  . Penicillins Other (See Comments)    "Hardened lump" Has patient had a PCN reaction causing immediate rash, facial/tongue/throat swelling, SOB or lightheadedness with hypotension: No Has patient had a PCN reaction causing severe rash involving mucus membranes or skin necrosis: No Has patient had a PCN reaction that required hospitalization No Has patient had a PCN reaction occurring within the last 10 years: No If all of the above answers are "NO", then may proceed with Cephalosporin use.    . Sulfa Antibiotics Rash    DISCHARGE MEDICATIONS:   Current Discharge Medication List    START taking these medications   Details  aspirin EC 81 MG EC tablet Take 1 tablet (81 mg total) by mouth daily. Qty: 30 tablet, Refills: 3    diltiazem (CARDIZEM CD) 300 MG 24 hr capsule Take 1 capsule (300 mg total) by mouth daily. Qty: 7 capsule, Refills: 0    ipratropium-albuterol (DUONEB) 0.5-2.5 (3) MG/3ML SOLN Take 3 mLs by nebulization every 4 (four) hours. Qty: 360 mL, Refills: 12    isosorbide mononitrate (IMDUR) 30 MG 24 hr tablet Take 1 tablet (30 mg total) by mouth daily. Qty: 30 tablet, Refills: 3    predniSONE (DELTASONE) 10 MG tablet Take 1 tablet (10 mg total) by mouth daily with breakfast. Please take 6 pills in the morning on the day one then taper by 1 pill daily until finished, thank you Qty: 21 tablet, Refills: 0      CONTINUE these medications which have CHANGED   Details  albuterol (PROVENTIL HFA;VENTOLIN HFA) 108 (90 Base) MCG/ACT inhaler Inhale 2 puffs into the lungs every 4 (four) hours as needed for wheezing or shortness of breath. Qty: 1 Inhaler, Refills: 10    levothyroxine (SYNTHROID) 50 MCG tablet Take 1 tablet (50 mcg total) by mouth daily. Qty: 30 tablet, Refills: 6      CONTINUE these medications which have NOT CHANGED   Details  Coenzyme Q10 (CO Q 10 PO) Take 1 tablet by mouth daily.    diltiazem (TIAZAC) 300 MG 24 hr capsule  Take 300 mg by mouth daily.    losartan (COZAAR) 50 MG tablet Take 50 mg by mouth daily.    metFORMIN (GLUCOPHAGE) 500 MG tablet Take 500 mg by mouth 2 (two) times daily with a meal.     metoprolol succinate (TOPROL-XL) 50 MG 24 hr tablet Take 50 mg by mouth at bedtime. Take with or immediately following a meal.    Multiple Vitamins-Minerals (PRESERVISION AREDS 2) CAPS Take 1 capsule by mouth daily.    omeprazole (PRILOSEC) 40 MG capsule Take 40 mg by mouth daily as needed (heartburn).    pravastatin (PRAVACHOL) 40 MG tablet Take 40 mg by mouth at bedtime.     triamterene-hydrochlorothiazide (MAXZIDE-25) 37.5-25 MG per tablet Take 0.5 tablets by mouth daily.    Fluticasone-Salmeterol (ADVAIR DISKUS) 250-50 MCG/DOSE AEPB Inhale 1 puff into the lungs 2 (two) times daily. Qty: 60 each, Refills: 1         DISCHARGE INSTRUCTIONS:    The patient is to follow-up with primary care physician within one week after discharge, pulmonologist, Dr. Ashby Dawes   If you experience worsening of your admission symptoms, develop shortness of breath, life threatening emergency, suicidal or homicidal thoughts you must seek medical attention immediately by calling 911 or calling  your MD immediately  if symptoms less severe.  You Must read complete instructions/literature along with all the possible adverse reactions/side effects for all the Medicines you take and that have been prescribed to you. Take any new Medicines after you have completely understood and accept all the possible adverse reactions/side effects.   Please note  You were cared for by a hospitalist during your hospital stay. If you have any questions about your discharge medications or the care you received while you were in the hospital after you are discharged, you can call the unit and asked to speak with the hospitalist on call if the hospitalist that took care of you is not available. Once you are discharged, your primary care  physician will handle any further medical issues. Please note that NO REFILLS for any discharge medications will be authorized once you are discharged, as it is imperative that you return to your primary care physician (or establish a relationship with a primary care physician if you do not have one) for your aftercare needs so that they can reassess your need for medications and monitor your lab values.    Today   CHIEF COMPLAINT:   Chief Complaint  Patient presents with  . Abdominal Pain  . Chest Pain    HISTORY OF PRESENT ILLNESS:     VITAL SIGNS:  Blood pressure (!) 139/49, pulse 91, temperature 97.5 F (36.4 C), temperature source Oral, resp. rate 18, height 4\' 11"  (1.499 m), weight 88.8 kg (195 lb 12.8 oz), SpO2 94 %.  I/O:   Intake/Output Summary (Last 24 hours) at 11/25/16 1404 Last data filed at 11/25/16 0519  Gross per 24 hour  Intake              240 ml  Output              500 ml  Net             -260 ml    PHYSICAL EXAMINATION:  GENERAL:  83 y.o.-year-old patient lying in the bed with no acute distress.  EYES: Pupils equal, round, reactive to light and accommodation. No scleral icterus. Extraocular muscles intact.  HEENT: Head atraumatic, normocephalic. Oropharynx and nasopharynx clear.  NECK:  Supple, no jugular venous distention. No thyroid enlargement, no tenderness.  LUNGS: Normal breath sounds bilaterally, no wheezing, rales,rhonchi or crepitation. No use of accessory muscles of respiration.  CARDIOVASCULAR: S1, S2 normal. No murmurs, rubs, or gallops.  ABDOMEN: Soft, non-tender, non-distended. Bowel sounds present. No organomegaly or mass.  EXTREMITIES: No pedal edema, cyanosis, or clubbing.  NEUROLOGIC: Cranial nerves II through XII are intact. Muscle strength 5/5 in all extremities. Sensation intact. Gait not checked.  PSYCHIATRIC: The patient is alert and oriented x 3.  SKIN: No obvious rash, lesion, or ulcer.   DATA REVIEW:   CBC  Recent  Labs Lab 11/24/16 0733  WBC 9.2  HGB 13.2  HCT 37.7  PLT 302    Chemistries   Recent Labs Lab 11/23/16 1904 11/24/16 0136 11/24/16 0733  NA 134*  --  137  K 3.8  --  3.7  CL 98*  --  103  CO2 27  --  28  GLUCOSE 99  --  76  BUN 19  --  18  CREATININE 0.71  --  0.72  CALCIUM 9.6  --  9.4  MG 1.7 1.8  --   AST 26  --   --   ALT 17  --   --  ALKPHOS 82  --   --   BILITOT 0.6  --   --     Cardiac Enzymes  Recent Labs Lab 11/24/16 1332  TROPONINI <0.03    Microbiology Results  Results for orders placed or performed during the hospital encounter of 11/05/15  C difficile quick scan w PCR reflex     Status: Abnormal   Collection Time: 11/06/15  1:38 AM  Result Value Ref Range Status   C Diff antigen POSITIVE (A) NEGATIVE Final    Comment: CRITICAL RESULT CALLED TO, READ BACK BY AND VERIFIED WITH: ANDREA BRYANT ON 11/06/15 AT 0455 BY TLB    C Diff toxin NEGATIVE NEGATIVE Final   C Diff interpretation   Final    Positive for toxigenic C. difficile, active toxin production not detected. Patient has toxigenic C. difficile organisms present in the bowel, but toxin was not detected. The patient may be a carrier or the level of toxin in the sample was below the limit  of detection. This information should be used in conjunction with the patient's clinical history when deciding on possible therapy.   Gastrointestinal Panel by PCR , Stool     Status: None   Collection Time: 11/06/15  1:38 AM  Result Value Ref Range Status   Campylobacter species NOT DETECTED NOT DETECTED Final   Plesimonas shigelloides NOT DETECTED NOT DETECTED Final   Salmonella species NOT DETECTED NOT DETECTED Final   Yersinia enterocolitica NOT DETECTED NOT DETECTED Final   Vibrio species NOT DETECTED NOT DETECTED Final   Vibrio cholerae NOT DETECTED NOT DETECTED Final   Enteroaggregative E coli (EAEC) NOT DETECTED NOT DETECTED Final   Enteropathogenic E coli (EPEC) NOT DETECTED NOT DETECTED Final    Enterotoxigenic E coli (ETEC) NOT DETECTED NOT DETECTED Final   Shiga like toxin producing E coli (STEC) NOT DETECTED NOT DETECTED Final   E. coli O157 NOT DETECTED NOT DETECTED Final   Shigella/Enteroinvasive E coli (EIEC) NOT DETECTED NOT DETECTED Final   Cryptosporidium NOT DETECTED NOT DETECTED Final   Cyclospora cayetanensis NOT DETECTED NOT DETECTED Final   Entamoeba histolytica NOT DETECTED NOT DETECTED Final   Giardia lamblia NOT DETECTED NOT DETECTED Final   Adenovirus F40/41 NOT DETECTED NOT DETECTED Final   Astrovirus NOT DETECTED NOT DETECTED Final   Norovirus GI/GII NOT DETECTED NOT DETECTED Final   Rotavirus A NOT DETECTED NOT DETECTED Final   Sapovirus (I, II, IV, and V) NOT DETECTED NOT DETECTED Final  Clostridium Difficile by PCR     Status: Abnormal   Collection Time: 11/06/15  1:38 AM  Result Value Ref Range Status   Toxigenic C Difficile by pcr POSITIVE (A) NEGATIVE Final    Comment: CRITICAL RESULT CALLED TO, READ BACK BY AND VERIFIED WITH: ANDREA BRYANT ON 11/06/15 AT 0455 BY TLB     RADIOLOGY:  Dg Chest 2 View  Result Date: 11/23/2016 CLINICAL DATA:  Chest pain. EXAM: CHEST  2 VIEW COMPARISON:  Radiographs of December 02, 2015. FINDINGS: Stable cardiomediastinal silhouette. Atherosclerosis of thoracic aorta is noted. No pneumothorax or pleural effusion is noted. Both lungs are clear. The visualized skeletal structures are unremarkable. IMPRESSION: No active cardiopulmonary disease.  Aortic atherosclerosis. Electronically Signed   By: Marijo Conception, M.D.   On: 11/23/2016 20:06   Ct Angio Chest Pe W Or Wo Contrast  Result Date: 11/23/2016 CLINICAL DATA:  Epigastric discomfort beginning a few hours ago radiating to the chest, neck and left jaw. Chest pain. EXAM: CT  ANGIOGRAPHY CHEST WITH CONTRAST TECHNIQUE: Multidetector CT imaging of the chest was performed using the standard protocol during bolus administration of intravenous contrast. Multiplanar CT image  reconstructions and MIPs were obtained to evaluate the vascular anatomy. CONTRAST:  Patient was premedicated with a 1 hour emergency prep due to hives and dyspnea after pre seeding iodinated contrast in the past. Patient demonstrated no immediate adverse affects per technologist after the study. 75 cc of Isovue 370 was administered IV. COMPARISON:  Numerous chest CT exams dating back through 02/21/2011. CT neck report from 08/04/2000. FINDINGS: Cardiovascular: Aortic atherosclerosis. No acute central pulmonary embolus. Top normal-sized cardiac chambers without pericardial effusion. There is coronary arteriosclerosis. Mediastinum/Nodes: Substernal goiter that extends off the lower pole the right thyroid gland and extends along the right paratracheal portion of the mediastinum is again seen currently measuring 5.2 x 3.4 x 3.7 cm and is largely stable in appearance. No pathologically enlarged lymph nodes. Ulcerated soft plaque is seen along the descending thoracic aorta. No dissection. Lungs/Pleura: Centrilobular emphysema with subpleural areas of fibrosis bilaterally. Azygos fissure of incidental note. No pneumonic consolidation or mass. No effusion or pneumothorax. Upper Abdomen: Cortical scarring of the left upper pole of the kidney. Benign adenoma of the left adrenal gland measuring 2 x 1.6 cm. Cholecystectomy. Musculoskeletal: Partially imaged 2 x 2.4 cm nodular density at the base of the neck on the right may represent partial volume averaging of the submandibular gland but is nonspecific and incompletely assessed. If clinically warranted a CT neck may help for further correlation. Review of the MIP images confirms the above findings. IMPRESSION: 1. Partially imaged soft tissue nodular density measuring 2.4 x 2 cm at the base of the neck anteriorly on the right. Findings may represent partial volume averaging of the submandibular gland. Lymph node is not entirely excluded. CT of the neck may help for further  correlation. 2. Stable right-sided quarter extending along the right paratracheal portion of the mediastinum measuring 5.2 x 3.4 x 3.7 cm currently. 3. Stable benign left adrenal adenoma measuring 2 x 1.6 cm. 4. Cholecystectomy. 5. No acute pulmonary embolus.  COPD. Electronically Signed   By: Ashley Royalty M.D.   On: 11/23/2016 23:28   Nm Myocar Multi W/spect W/wall Motion / Ef  Result Date: 11/25/2016  The study is normal.  This is a low risk study.  The left ventricular ejection fraction is normal (55-65%).  There was no ST segment deviation noted during stress.  Normal lexiscan stress sestimibi. Normal lv function with no ischemia    EKG:   Orders placed or performed during the hospital encounter of 11/23/16  . EKG 12-Lead  . EKG 12-Lead  . EKG 12-lead  . EKG 12-Lead  . EKG 12-Lead  . EKG 12-Lead  . EKG 12-Lead  . EKG 12-Lead      Management plans discussed with the patient, family and they are in agreement.  CODE STATUS:     Code Status Orders        Start     Ordered   11/24/16 0058  Full code  Continuous     11/24/16 0057    Code Status History    Date Active Date Inactive Code Status Order ID Comments User Context   11/06/2015  5:03 AM 11/07/2015  8:29 PM Full Code [id number]  Harrie Foreman, MD ED      TOTAL TIME TAKING CARE OF THIS PATIENT: 40 minutes.    Theodoro Grist M.D on 11/25/2016 at 2:04 PM  Between 7am to 6pm - Pager - [phone]  After 6pm go to [email or web address] - password [email or web address] [facility]  Office  [phone]  CC: Primary care physician; Perrin Maltese, MD

## 2016-12-01 DIAGNOSIS — I2510 Atherosclerotic heart disease of native coronary artery without angina pectoris: Secondary | ICD-10-CM | POA: Diagnosis not present

## 2016-12-01 DIAGNOSIS — E782 Mixed hyperlipidemia: Secondary | ICD-10-CM | POA: Diagnosis not present

## 2016-12-01 DIAGNOSIS — I10 Essential (primary) hypertension: Secondary | ICD-10-CM | POA: Diagnosis not present

## 2016-12-01 DIAGNOSIS — E119 Type 2 diabetes mellitus without complications: Secondary | ICD-10-CM | POA: Diagnosis not present

## 2016-12-02 DIAGNOSIS — I10 Essential (primary) hypertension: Secondary | ICD-10-CM | POA: Diagnosis not present

## 2016-12-02 DIAGNOSIS — E7800 Pure hypercholesterolemia, unspecified: Secondary | ICD-10-CM | POA: Diagnosis not present

## 2016-12-02 DIAGNOSIS — G4733 Obstructive sleep apnea (adult) (pediatric): Secondary | ICD-10-CM | POA: Diagnosis not present

## 2016-12-02 DIAGNOSIS — E782 Mixed hyperlipidemia: Secondary | ICD-10-CM | POA: Diagnosis not present

## 2016-12-17 DIAGNOSIS — C44529 Squamous cell carcinoma of skin of other part of trunk: Secondary | ICD-10-CM | POA: Diagnosis not present

## 2016-12-17 DIAGNOSIS — D485 Neoplasm of uncertain behavior of skin: Secondary | ICD-10-CM | POA: Diagnosis not present

## 2016-12-17 DIAGNOSIS — C44629 Squamous cell carcinoma of skin of left upper limb, including shoulder: Secondary | ICD-10-CM | POA: Diagnosis not present

## 2016-12-17 DIAGNOSIS — L821 Other seborrheic keratosis: Secondary | ICD-10-CM | POA: Diagnosis not present

## 2016-12-22 NOTE — Progress Notes (Signed)
* [facility]     Assessment and Plan:  The patient is a 83 year old obese female with history of severe emphysema complicated by and reflux, multiple exacerbations  Emphysema/COPD. -Advair does not appear to be helping, and made her jittery, can stop.  --Duonebs did not make jer jittery in the hospital will prescribe along with nebulizer. --Restrictive lung disease seen on most recent PFT 12/03/15; likely due to significant abdominal obesity.   Kelsey Young.  --Severe gerd, continue antireflux measures. --Pt has abdominal obesity which is likely contributing.   Thyromegaly.  -Right calcified paratracheal mass thought to represent thyroid extension.   Diaphragmatic hernia.  -Small, appears unchanged compared with recent CT -AP, likely related to obesity.   Snoring.  --Symptoms suspicious for OSA, but declines to try CPAP, therefore will not order sleep study.  --Will check overnight oximetry to see if she requires oxygen overnight.   Date: 12/22/2016  MRN# [id number] Kelsey Young [birth year]   Kelsey Young is a 83 y.o. old female seen in follow up for chief complaint of  Chief Complaint  Patient presents with  . Shortness of Breath    pt following up on sob:     HPI:   Kelsey Young is an 83 yo female, recently seen in the hospital for AECOPD complicated by morbid obesity and untreated OSA, discharged on 11/25/16.   She was discharged on ventolin but found that it made her reflux worse so she stopped it. She notices that advair made her very nervous so she stopped it. She does not notice a problem with nebulized duoneb when she used it in the hospital. She has it at home, but does not have a nebulizer.  She complains of a runny nose.   I personally reviewed, images, CT chest 11/23/16: Severe diffuse emphysema, worst in the apices bilaterally.    Pulmonary function test interpretation 12/03/15: Spirometry: The FVC was 1.53 L which was 78% of predicted, the FEV1  was 1.26 L which is 88% of predicted. The FEV to FVC ratio was 82%. There is no significant reversibility with bronchodilator. Lung volumes: Residual volume was 60%, total lung capacity was 66%, RV to TLC ratio was 94%. This is suggestive of restriction. Diffusion: Diffusion capacity was 68% of predicted. Flow volume loop showed a restrictive pattern. Interpretation: Overall this test is suggestive of restrictive lung disease, however, the patient did not blow out for 6 seconds, which may falsely suggest restrictive lung disease.   Medication:   Outpatient Encounter Prescriptions as of 12/23/2016  Medication Sig  . albuterol (PROVENTIL HFA;VENTOLIN HFA) 108 (90 Base) MCG/ACT inhaler Inhale 2 puffs into the lungs every 4 (four) hours as needed for wheezing or shortness of breath.  Marland Kitchen aspirin EC 81 MG EC tablet Take 1 tablet (81 mg total) by mouth daily.  . Coenzyme Q10 (CO Q 10 PO) Take 1 tablet by mouth daily.  Marland Kitchen diltiazem (CARDIZEM CD) 300 MG 24 hr capsule Take 1 capsule (300 mg total) by mouth daily.  Marland Kitchen diltiazem (TIAZAC) 300 MG 24 hr capsule Take 300 mg by mouth daily.  . Fluticasone-Salmeterol (ADVAIR DISKUS) 250-50 MCG/DOSE AEPB Inhale 1 puff into the lungs 2 (two) times daily. (Patient not taking: Reported on 12/05/2015)  . ipratropium-albuterol (DUONEB) 0.5-2.5 (3) MG/3ML SOLN Take 3 mLs by nebulization every 4 (four) hours.  . isosorbide mononitrate (IMDUR) 30 MG 24 hr tablet Take 1 tablet (30 mg total) by mouth daily.  Marland Kitchen levothyroxine (SYNTHROID) 50 MCG tablet Take  1 tablet (50 mcg total) by mouth daily.  Marland Kitchen losartan (COZAAR) 50 MG tablet Take 50 mg by mouth daily.  . metFORMIN (GLUCOPHAGE) 500 MG tablet Take 500 mg by mouth 2 (two) times daily with a meal.   . metoprolol succinate (TOPROL-XL) 50 MG 24 hr tablet Take 50 mg by mouth at bedtime. Take with or immediately following a meal.  . Multiple Vitamins-Minerals (PRESERVISION AREDS 2) CAPS Take 1 capsule by mouth daily.  Marland Kitchen omeprazole  (PRILOSEC) 40 MG capsule Take 40 mg by mouth daily as needed (heartburn).  . pravastatin (PRAVACHOL) 40 MG tablet Take 40 mg by mouth at bedtime.   . predniSONE (DELTASONE) 10 MG tablet Take 1 tablet (10 mg total) by mouth daily with breakfast. Please take 6 pills in the morning on the day one then taper by 1 pill daily until finished, thank you  . triamterene-hydrochlorothiazide (MAXZIDE-25) 37.5-25 MG per tablet Take 0.5 tablets by mouth daily.   No facility-administered encounter medications on file as of 12/23/2016.      Allergies:  Contrast media [iodinated diagnostic agents]; Sulfur; Codeine; Morphine; Oxycodone; Penicillins; and Sulfa antibiotics  Review of Systems: Gen:  Denies  fever, sweats. HEENT: Denies blurred vision. Cvc:  No dizziness, chest pain or heaviness Resp:   Denies cough or sputum porduction. Gi: Denies swallowing difficulty, stomach pain. constipation, bowel incontinence Gu:  Denies bladder incontinence, burning urine Ext:   No Joint pain, stiffness. Skin: No skin rash, easy bruising. Endoc:  No polyuria, polydipsia. Psych: No depression, insomnia. Other:  All other systems were reviewed and found to be negative other than what is mentioned in the HPI.   Physical Examination:   VS: BP (!) 146/68 (BP Location: Left Arm, Cuff Size: Normal)   Pulse 85   Resp 16   Ht 4\' 11"  (1.499 m)   Wt 201 lb (91.2 kg)   SpO2 94%   BMI 40.60 kg/m   General Appearance: No distress  Neuro:without focal findings,  speech normal,  HEENT: PERRLA, EOM intact. Pulmonary: normal breath sounds, No wheezing.   CardiovascularNormal S1,S2.  No m/r/g.   Abdomen: Benign, Soft, non-tender; obesity.  Renal:  No costovertebral tenderness  GU:  Not performed at this time. Endoc: No evident thyromegaly, no signs of acromegaly. Skin:   warm, no rash. Extremities: normal, no cyanosis, clubbing.   LABORATORY PANEL:   CBC No results for input(s): WBC, HGB, HCT, PLT in the last 168  hours. ------------------------------------------------------------------------------------------------------------------  Chemistries  No results for input(s): NA, K, CL, CO2, GLUCOSE, BUN, CREATININE, CALCIUM, MG, AST, ALT, ALKPHOS, BILITOT in the last 168 hours.  Invalid input(s): GFRCGP ------------------------------------------------------------------------------------------------------------------  Cardiac Enzymes No results for input(s): TROPONINI in the last 168 hours. ------------------------------------------------------------  RADIOLOGY:   No results found for this or any previous visit. Results for orders placed during the hospital encounter of 12/02/15  DG Chest 2 View   Narrative CLINICAL DATA:  Pt states she hasn't been feeling well for a week now, states yesterday she began having diaphoresis, discomfort in her upper abdomen area, had some severe reflux the night before, was treated 3 weeks ago for c. difficile  EXAM: CHEST  2 VIEW  COMPARISON:  11/06/2015  FINDINGS: There is bilateral chronic interstitial thickening. There is no focal parenchymal opacity. There is no pleural effusion or pneumothorax. The heart and mediastinal contours are unremarkable. There is thoracic aortic atherosclerosis.  The osseous structures are unremarkable.  IMPRESSION: No active cardiopulmonary disease.   Electronically Signed  By: Kathreen Devoid   On: 12/02/2015 10:12    ------------------------------------------------------------------------------------------------------------------  Thank  you for allowing [facility] to assist in the care of your patient. Our recommendations are noted above.  Please contact us if we can be of further service.   Marda Stalker, MD.  [facility] Office Number: [phone]  Patricia Pesa, M.D.  Vilinda Boehringer, M.D.  Merton Border, M.D  12/22/2016

## 2016-12-23 ENCOUNTER — Ambulatory Visit (INDEPENDENT_AMBULATORY_CARE_PROVIDER_SITE_OTHER): Payer: PPO | Admitting: Internal Medicine

## 2016-12-23 ENCOUNTER — Encounter: Payer: Self-pay | Admitting: Internal Medicine

## 2016-12-23 VITALS — BP 146/68 | HR 85 | Resp 16 | Ht 59.0 in | Wt 201.0 lb

## 2016-12-23 DIAGNOSIS — J9601 Acute respiratory failure with hypoxia: Secondary | ICD-10-CM

## 2016-12-23 MED ORDER — ALBUTEROL SULFATE HFA 108 (90 BASE) MCG/ACT IN AERS: 2.0000 | INHALATION_SPRAY | RESPIRATORY_TRACT | 10 refills | Status: DC | PRN

## 2016-12-23 NOTE — Patient Instructions (Signed)
Continue nebulized medications.

## 2016-12-25 ENCOUNTER — Telehealth: Payer: Self-pay | Admitting: Internal Medicine

## 2016-12-25 DIAGNOSIS — J449 Chronic obstructive pulmonary disease, unspecified: Secondary | ICD-10-CM | POA: Diagnosis not present

## 2016-12-25 DIAGNOSIS — Z96659 Presence of unspecified artificial knee joint: Secondary | ICD-10-CM | POA: Diagnosis not present

## 2016-12-25 NOTE — Telephone Encounter (Signed)
Pt calling asking if when we send in prescriptions  To please send them all to [facility] on [address]

## 2016-12-25 NOTE — Telephone Encounter (Signed)
Patient was inquiring if we had called her. She does not need any rx called in. Confirmed Albuterol sent to pharmacy and she is going to pick up her nebulizer today.

## 2016-12-31 DIAGNOSIS — E782 Mixed hyperlipidemia: Secondary | ICD-10-CM | POA: Diagnosis not present

## 2016-12-31 DIAGNOSIS — E039 Hypothyroidism, unspecified: Secondary | ICD-10-CM | POA: Diagnosis not present

## 2016-12-31 DIAGNOSIS — E119 Type 2 diabetes mellitus without complications: Secondary | ICD-10-CM | POA: Diagnosis not present

## 2016-12-31 DIAGNOSIS — I10 Essential (primary) hypertension: Secondary | ICD-10-CM | POA: Diagnosis not present

## 2017-01-01 DIAGNOSIS — L905 Scar conditions and fibrosis of skin: Secondary | ICD-10-CM | POA: Diagnosis not present

## 2017-01-01 DIAGNOSIS — C44529 Squamous cell carcinoma of skin of other part of trunk: Secondary | ICD-10-CM | POA: Diagnosis not present

## 2017-01-18 DIAGNOSIS — E119 Type 2 diabetes mellitus without complications: Secondary | ICD-10-CM | POA: Diagnosis not present

## 2017-01-18 DIAGNOSIS — H353131 Nonexudative age-related macular degeneration, bilateral, early dry stage: Secondary | ICD-10-CM | POA: Diagnosis not present

## 2017-01-24 DIAGNOSIS — J449 Chronic obstructive pulmonary disease, unspecified: Secondary | ICD-10-CM | POA: Diagnosis not present

## 2017-01-24 DIAGNOSIS — Z96659 Presence of unspecified artificial knee joint: Secondary | ICD-10-CM | POA: Diagnosis not present

## 2017-01-26 DIAGNOSIS — C44722 Squamous cell carcinoma of skin of right lower limb, including hip: Secondary | ICD-10-CM | POA: Insufficient documentation

## 2017-01-26 DIAGNOSIS — D099 Carcinoma in situ, unspecified: Secondary | ICD-10-CM | POA: Diagnosis not present

## 2017-01-26 DIAGNOSIS — L578 Other skin changes due to chronic exposure to nonionizing radiation: Secondary | ICD-10-CM | POA: Diagnosis not present

## 2017-01-26 DIAGNOSIS — Z85828 Personal history of other malignant neoplasm of skin: Secondary | ICD-10-CM | POA: Diagnosis not present

## 2017-01-28 DIAGNOSIS — C44629 Squamous cell carcinoma of skin of left upper limb, including shoulder: Secondary | ICD-10-CM | POA: Diagnosis not present

## 2017-01-28 DIAGNOSIS — L905 Scar conditions and fibrosis of skin: Secondary | ICD-10-CM | POA: Diagnosis not present

## 2017-02-12 DIAGNOSIS — E119 Type 2 diabetes mellitus without complications: Secondary | ICD-10-CM | POA: Diagnosis not present

## 2017-02-12 DIAGNOSIS — E039 Hypothyroidism, unspecified: Secondary | ICD-10-CM | POA: Diagnosis not present

## 2017-02-12 DIAGNOSIS — E782 Mixed hyperlipidemia: Secondary | ICD-10-CM | POA: Diagnosis not present

## 2017-02-12 DIAGNOSIS — I10 Essential (primary) hypertension: Secondary | ICD-10-CM | POA: Diagnosis not present

## 2017-02-18 ENCOUNTER — Other Ambulatory Visit: Payer: Self-pay | Admitting: Internal Medicine

## 2017-02-18 DIAGNOSIS — I10 Essential (primary) hypertension: Secondary | ICD-10-CM | POA: Diagnosis not present

## 2017-02-18 DIAGNOSIS — E782 Mixed hyperlipidemia: Secondary | ICD-10-CM | POA: Diagnosis not present

## 2017-02-18 DIAGNOSIS — E119 Type 2 diabetes mellitus without complications: Secondary | ICD-10-CM | POA: Diagnosis not present

## 2017-02-18 DIAGNOSIS — Z1231 Encounter for screening mammogram for malignant neoplasm of breast: Secondary | ICD-10-CM

## 2017-02-24 DIAGNOSIS — J449 Chronic obstructive pulmonary disease, unspecified: Secondary | ICD-10-CM | POA: Diagnosis not present

## 2017-02-24 DIAGNOSIS — Z96659 Presence of unspecified artificial knee joint: Secondary | ICD-10-CM | POA: Diagnosis not present

## 2017-03-04 ENCOUNTER — Ambulatory Visit
Admission: RE | Admit: 2017-03-04 | Discharge: 2017-03-04 | Disposition: A | Discharge status: Home / Self Care | Payer: PPO | Source: Ambulatory Visit | Attending: Internal Medicine | Admitting: Internal Medicine

## 2017-03-04 DIAGNOSIS — Z1231 Encounter for screening mammogram for malignant neoplasm of breast: Secondary | ICD-10-CM | POA: Insufficient documentation

## 2017-03-10 ENCOUNTER — Other Ambulatory Visit: Payer: Self-pay | Admitting: *Deleted

## 2017-03-10 ENCOUNTER — Inpatient Hospital Stay
Admission: RE | Admit: 2017-03-10 | Discharge: 2017-03-10 | Disposition: A | Discharge status: Home / Self Care | Payer: Self-pay | Source: Ambulatory Visit | Attending: *Deleted | Admitting: *Deleted

## 2017-03-10 DIAGNOSIS — Z9289 Personal history of other medical treatment: Secondary | ICD-10-CM

## 2017-03-17 ENCOUNTER — Other Ambulatory Visit: Payer: Self-pay | Admitting: Internal Medicine

## 2017-03-17 DIAGNOSIS — N6489 Other specified disorders of breast: Secondary | ICD-10-CM

## 2017-03-17 DIAGNOSIS — R928 Other abnormal and inconclusive findings on diagnostic imaging of breast: Secondary | ICD-10-CM

## 2017-03-27 DIAGNOSIS — Z96659 Presence of unspecified artificial knee joint: Secondary | ICD-10-CM | POA: Diagnosis not present

## 2017-03-27 DIAGNOSIS — J449 Chronic obstructive pulmonary disease, unspecified: Secondary | ICD-10-CM | POA: Diagnosis not present

## 2017-03-29 DIAGNOSIS — C801 Malignant (primary) neoplasm, unspecified: Secondary | ICD-10-CM

## 2017-03-29 DIAGNOSIS — Z85828 Personal history of other malignant neoplasm of skin: Secondary | ICD-10-CM | POA: Diagnosis not present

## 2017-03-29 DIAGNOSIS — L853 Xerosis cutis: Secondary | ICD-10-CM | POA: Diagnosis not present

## 2017-03-29 DIAGNOSIS — K649 Unspecified hemorrhoids: Secondary | ICD-10-CM

## 2017-03-29 DIAGNOSIS — L578 Other skin changes due to chronic exposure to nonionizing radiation: Secondary | ICD-10-CM | POA: Diagnosis not present

## 2017-03-29 DIAGNOSIS — R600 Localized edema: Secondary | ICD-10-CM

## 2017-03-29 HISTORY — DX: Unspecified hemorrhoids: K64.9

## 2017-03-29 HISTORY — DX: Malignant (primary) neoplasm, unspecified: C80.1

## 2017-03-29 HISTORY — DX: Localized edema: R60.0

## 2017-04-02 ENCOUNTER — Ambulatory Visit
Admission: RE | Admit: 2017-04-02 | Discharge: 2017-04-02 | Disposition: A | Discharge status: Home / Self Care | Payer: PPO | Source: Ambulatory Visit | Attending: Internal Medicine | Admitting: Internal Medicine

## 2017-04-02 DIAGNOSIS — N6489 Other specified disorders of breast: Secondary | ICD-10-CM

## 2017-04-02 DIAGNOSIS — N6320 Unspecified lump in the left breast, unspecified quadrant: Secondary | ICD-10-CM | POA: Diagnosis not present

## 2017-04-02 DIAGNOSIS — R928 Other abnormal and inconclusive findings on diagnostic imaging of breast: Secondary | ICD-10-CM

## 2017-04-06 ENCOUNTER — Other Ambulatory Visit: Payer: Self-pay | Admitting: Internal Medicine

## 2017-04-06 DIAGNOSIS — N6320 Unspecified lump in the left breast, unspecified quadrant: Secondary | ICD-10-CM

## 2017-04-06 DIAGNOSIS — R928 Other abnormal and inconclusive findings on diagnostic imaging of breast: Secondary | ICD-10-CM

## 2017-04-09 ENCOUNTER — Ambulatory Visit
Admission: RE | Admit: 2017-04-09 | Discharge: 2017-04-09 | Disposition: A | Discharge status: Home / Self Care | Payer: PPO | Source: Ambulatory Visit | Attending: Internal Medicine | Admitting: Internal Medicine

## 2017-04-09 DIAGNOSIS — C50412 Malignant neoplasm of upper-outer quadrant of left female breast: Secondary | ICD-10-CM | POA: Insufficient documentation

## 2017-04-09 DIAGNOSIS — N6320 Unspecified lump in the left breast, unspecified quadrant: Secondary | ICD-10-CM

## 2017-04-09 DIAGNOSIS — N6321 Unspecified lump in the left breast, upper outer quadrant: Secondary | ICD-10-CM | POA: Diagnosis not present

## 2017-04-09 DIAGNOSIS — R928 Other abnormal and inconclusive findings on diagnostic imaging of breast: Secondary | ICD-10-CM

## 2017-04-09 HISTORY — PX: BREAST BIOPSY: SHX20

## 2017-04-13 DIAGNOSIS — E782 Mixed hyperlipidemia: Secondary | ICD-10-CM | POA: Diagnosis not present

## 2017-04-13 DIAGNOSIS — C50412 Malignant neoplasm of upper-outer quadrant of left female breast: Secondary | ICD-10-CM | POA: Diagnosis not present

## 2017-04-13 DIAGNOSIS — E119 Type 2 diabetes mellitus without complications: Secondary | ICD-10-CM | POA: Diagnosis not present

## 2017-04-14 ENCOUNTER — Telehealth: Payer: Self-pay | Admitting: Oncology

## 2017-04-14 NOTE — Telephone Encounter (Signed)
Consult for Breast Ca. Ref by Dr Lamonte Sakai. Notes in book. New patient forms left at Registration for patient to complete. Appt conf with patient. MF

## 2017-04-15 ENCOUNTER — Other Ambulatory Visit: Payer: Self-pay | Admitting: *Deleted

## 2017-04-15 ENCOUNTER — Encounter: Payer: Self-pay | Admitting: *Deleted

## 2017-04-15 ENCOUNTER — Ambulatory Visit: Payer: Self-pay | Admitting: Surgery

## 2017-04-15 DIAGNOSIS — C50912 Malignant neoplasm of unspecified site of left female breast: Secondary | ICD-10-CM | POA: Diagnosis not present

## 2017-04-15 DIAGNOSIS — C50012 Malignant neoplasm of nipple and areola, left female breast: Secondary | ICD-10-CM

## 2017-04-15 DIAGNOSIS — E119 Type 2 diabetes mellitus without complications: Secondary | ICD-10-CM | POA: Diagnosis not present

## 2017-04-15 DIAGNOSIS — Z23 Encounter for immunization: Secondary | ICD-10-CM | POA: Diagnosis not present

## 2017-04-15 NOTE — Progress Notes (Signed)
Patient returned my call.  She is scheduled to see Dr. Grayland Ormond on Monday at 2:45.  I will give the patient breast cancer educational literature, "My Breast Cancer Treatment Handbook" by Josephine Igo, RN at her appointment on Monday.  States she may come alone, because of her granddaughters illness.

## 2017-04-15 NOTE — Progress Notes (Signed)
  Oncology Nurse Navigator Documentation  Navigator Location: [facility] (04/15/17 1000) Referral date to RadOnc/MedOnc: 04/19/17 (04/15/17 1000) )Navigator Encounter Type: Introductory phone call (04/15/17 1000)   Abnormal Finding Date: 04/02/17 (04/15/17 1000) Confirmed Diagnosis Date: 04/12/17 (04/15/17 1000)                                              Time Spent with Patient: 15 (04/15/17 1000)   Tried to call patient to establish navigation services.  No answer.  Left patient a message to return my call.

## 2017-04-15 NOTE — H&P (Signed)
[name] 04/15/2017 10:18 AM Location: [place] [facility] Patient #: [id number] DOB: 12-14-1931 Widowed / Language: Kelsey Young / Race: White Female   History of Present Illness Kelsey Young Done MD; 04/15/2017 11:09 AM) The patient is a 83 year old female who presents with breast cancer. Kelsey Young is referred by Dr. Humphrey Rolls with newly diagnosed left breast cancer. She is an 83 year old white female who was found on recent mammogram to have a small spiculated lesion in the left breast at the 1 o'clock position about 3 cm away from the areolar margin. A core biopsy was taken with ultrasound guidance that showed invasive cancer. Ultrasound did not show any enlarged lymph nodes.  I have discussed needle localized left breast lumpectomy and left axillary sentinel node biopsy with her. She has an appointment scheduled with oncology next week. I will be at the [facility] through next Wednesday and could possibly do her on either Thursday or Friday of next week or the following week. She is also having to deal with her granddaughter who has a newborn and has a choriocarcinoma.  She asked about the flu shot and about a shingles shot. I told her that the flu shot would be good to take now; I would defer to Dr. Humphrey Rolls about the shot for shingles.  Ms. Langenderfer came in with her cane but seems very independent and her fund of knowledge and questions are good. She has some lumbar spinal stenosis, high blood pressure, hyperlipidemia, acid reflux, type 2 diabetes.   Past Surgical History Kelsey Young, CMA; 04/15/2017 10:31 AM) Appendectomy  Oral Surgery  Tonsillectomy  Thyroid Surgery  Breast Biopsy - Left  Cataract Surgery - Both  Hip Surgery - Both  Knee Surgery- Left  Gallbladder Surgery - Laparoscopic  Hysterectomy (not due to cancer) - Partial   Allergies (Chemira Jones, CMA; 04/15/2017 10:20 AM) Iodinated Diagnostic Agents  Sulfur *DERMATOLOGICALS*   Codeine Sulfate *ANALGESICS - OPIOID*  Morphine Sulfate *ANALGESICS - OPIOID*  OxyCODONE HCl *ANALGESICS - OPIOID*  Penicillins   Medication History (Chemira Jones, CMA; 04/15/2017 10:26 AM) Kelsey Young (300MG  Capsule ER 24HR, Oral) Active. Levothyroxine Sodium (50MCG Tablet, Oral) Active. Ipratropium-Albuterol (0.5-2.5 (3)MG/3ML Solution, Inhalation) Active. Losartan Potassium (50MG  Tablet, Oral) Active. MetFORMIN HCl (500MG  Tablet, Oral) Active. Metoprolol Tartrate (50MG  Tablet, Oral) Active. Pravastatin Sodium (40MG  Tablet, Oral) Active. ProAir HFA (108 (90 Base)MCG/ACT Aerosol Soln, Inhalation) Active. Triamterene-HCTZ (37.5-25MG  Tablet, Oral) Active. PredniSONE (10MG  Tablet, Oral) Active. Aspirin (81MG  Tablet, Oral) Active. Coenzyme Q10 (10MG  Capsule, Oral) Active. Advair Diskus (250-50MCG/DOSE Aero Pow Br Act, Inhalation) Active. Isosorbide Mononitrate (30MG  Tablet ER, Oral) Active. Multivitamin Adult (Oral) Active. Medications Reconciled  Social History Kelsey Young, CMA; 04/15/2017 10:28 AM) Current tobacco use  Former smoker. Alcohol use  Moderate alcohol use. Caffeine use  Coffee, Tea, Carbonated beverages. No drug use   Family History Kelsey Young, CMA; 04/15/2017 10:29 AM) Arthritis  Father, Brother, Son, Daughter. Depression  Mother. Diabetes Mellitus  Father. Heart Disease  Father, Brother. High Blood Pressure / Hypertension  Father, Mother. Lung/Respiratory Disease  Brother. Melanoma  Brother. Migraine Headache  Daughter. Stroke / CVA of the Brain  Mother. Other Cancer   Pregnancy / Birth History Kelsey Young, CMA; 04/15/2017 10:40 AM) Pregnancies ([place])  10. Deliveries ([place])  4. Age of menopause  <47. Mammogram  within the last year. Pap smear  1-5 years ago. Age at menarche  32 years.  Other Problems Kelsey Young, CMA; 04/15/2017 10:40 AM) Acid Reflux / GERD  Arthritis  Asthma  Back Pain   Hemorrhoids  High blood pressure  Thyroid Disease  COPD  Diabetes  Gallstones  High Cholesterol  Kidney Stone  Sleep Apnea     Review of Systems (Chemira Jones CMA; 04/15/2017 10:43 AM) Skin Present- Dryness. HEENT Present- Seasonal Allergies, Sinus Pain, Visual Disturbances and Wears glasses/contact lenses. Respiratory Present- Difficulty Breathing and Snoring. Breast Present- Breast Mass. Cardiovascular Present- Rapid Heart Rate, Shortness of Breath and Swelling of Extremities. Gastrointestinal Present- Bloody Stool, Constipation, Hemorrhoids and Rectal Pain. Female Genitourinary Present- Frequency and Urgency. Musculoskeletal Present- Back Pain, Joint Pain and Joint Stiffness. Psychiatric Present- Fearful. Endocrine Present- Hair Changes and Heat Intolerance. Hematology Present- Gland problems.  Vitals (Chemira Jones CMA; 04/15/2017 10:19 AM) 04/15/2017 10:19 AM Weight: 202.6 lb Height: 59in Body Surface Area: 1.85 m Body Mass Index: 40.92 kg/m  Pulse: 79 (Regular)  BP: 130/78 (Sitting, Left Arm, Standard)       Physical Exam (Shatima Zalar B. Young Done MD; 04/15/2017 11:11 AM) The physical exam findings are as follows: Note:Elderly WF NAD but upset about her condition and that of her granddaughter Skin Many solar changes and hx of dermatologic surgery HEENT unremarkable Chest clear Heart SR Breast: left breast with upper outer quadrant steristrips in place over the site of the biopsy. Not much hematoma noted. No axillary adenopathy palpated.    Assessment & Plan Kelsey Young Done MD; 04/15/2017 11:12 AM) BREAST CANCER, LEFT (C50.912) Impression: Schedule left needle localized lumpectomy of breast with sentinel axillary node mapping and biopsy at [facility] under general anesthesia.  Matt B. Young Done, MD, FACS

## 2017-04-16 ENCOUNTER — Other Ambulatory Visit: Payer: Self-pay | Admitting: Surgery

## 2017-04-16 DIAGNOSIS — C50012 Malignant neoplasm of nipple and areola, left female breast: Secondary | ICD-10-CM

## 2017-04-16 LAB — SURGICAL PATHOLOGY

## 2017-04-17 DIAGNOSIS — C50412 Malignant neoplasm of upper-outer quadrant of left female breast: Secondary | ICD-10-CM | POA: Insufficient documentation

## 2017-04-17 NOTE — Progress Notes (Signed)
[place]  [phone] [phone]  ID: Kelsey Young OB: 05/25/1932  MR#: [id number]  HGD#:[id number]  Patient Care Team: Perrin Maltese, MD as PCP - General (Internal Medicine)  CHIEF COMPLAINT: Clinical stage Ia ER positive, PR/HER-2 negative invasive carcinoma of the upper outer quadrant of the left breast.  INTERVAL HISTORY: Patient is an 83 year old female who was noted to have an abnormality on routine screening mammogram. Subsequent ultrasound and biopsy revealed the above stated breast cancer. She has some mild tenderness at the site of her biopsy, but otherwise feels well. She has no neurologic complaints. She denies any recent fevers or illnesses. She has a good appetite and denies weight loss. She has no chest pain or shortness of breath. She denies any nausea, vomiting, constipation, or diarrhea. She has no urinary complaints. Patient feels at her baseline and offers no further specific complaints today.  REVIEW OF SYSTEMS:   Review of Systems  Constitutional: Negative.  Negative for fever, malaise/fatigue and weight loss.  Respiratory: Negative.  Negative for cough and shortness of breath.   Cardiovascular: Negative.  Negative for chest pain and leg swelling.  Gastrointestinal: Negative.  Negative for abdominal pain.  Genitourinary: Negative.   Musculoskeletal: Negative.   Skin: Negative.  Negative for rash.  Neurological: Negative.  Negative for weakness.  Psychiatric/Behavioral: Negative.  The patient is not nervous/anxious.     As per HPI. Otherwise, a complete review of systems is negative.  PAST MEDICAL HISTORY: Past Medical History:  Diagnosis Date  . Diabetes mellitus without complication ([place])   . Hyperlipidemia   . Hypertension   . Skin cancer   . Sleep apnea   . Thyroid disease     PAST SURGICAL HISTORY: Past Surgical History:  Procedure Laterality Date  . ABDOMINAL HYSTERECTOMY    . APPENDECTOMY    .  CHOLECYSTECTOMY    . JOINT REPLACEMENT    . THYROIDECTOMY, PARTIAL    . TONSILLECTOMY      FAMILY HISTORY: Family History  Problem Relation Age of Onset  . Heart failure Mother   . Heart attack Father   . Diabetes Brother   . Heart disease Brother   . Melanoma Brother   . Cancer Maternal Uncle   . Diabetes Paternal Aunt   . Heart disease Paternal Aunt   . Heart disease Paternal Grandmother   . Diabetes Maternal Aunt   . Heart attack Maternal Aunt   . Diabetes Maternal Aunt   . Diabetes Paternal Aunt   . Heart disease Paternal Aunt   . Breast cancer Neg Hx     ADVANCED DIRECTIVES (Y/N):  N  HEALTH MAINTENANCE: Social History  Substance Use Topics  . Smoking status: Former Research scientist (life sciences)  . Smokeless tobacco: Never Used  . Alcohol use 0.6 - 1.2 oz/week    1 - 2 Standard drinks or equivalent per week     Comment: wine 3 times a week     Colonoscopy:  PAP:  Bone density:  Lipid panel:  Allergies  Allergen Reactions  . Contrast Media [Iodinated Diagnostic Agents] Hives, Shortness Of Breath and Other (See Comments)    Restless   . Sulfur Hives and Shortness Of Breath  . Codeine Nausea And Vomiting  . Morphine Nausea And Vomiting  . Oxycodone Nausea And Vomiting and Other (See Comments)    Dizzy  . Penicillins Other (See Comments)    "Hardened lump" Has patient had a PCN reaction causing immediate rash, facial/tongue/throat swelling, SOB  or lightheadedness with hypotension: No Has patient had a PCN reaction causing severe rash involving mucus membranes or skin necrosis: No Has patient had a PCN reaction that required hospitalization No Has patient had a PCN reaction occurring within the last 10 years: No If all of the above answers are "NO", then may proceed with Cephalosporin use.    . Sulfa Antibiotics Rash    Current Outpatient Prescriptions  Medication Sig Dispense Refill  . albuterol (PROVENTIL HFA;VENTOLIN HFA) 108 (90 Base) MCG/ACT inhaler Inhale 2 puffs  into the lungs every 4 (four) hours as needed for wheezing or shortness of breath. 1 Inhaler 10  . aspirin EC 81 MG EC tablet Take 1 tablet (81 mg total) by mouth daily. 30 tablet 3  . Coenzyme Q10 (CO Q 10 PO) Take 100 mg by mouth daily.     Marland Kitchen diltiazem (CARDIZEM CD) 300 MG 24 hr capsule Take 1 capsule (300 mg total) by mouth daily. (Patient taking differently: Take 300 mg by mouth at bedtime. ) 7 capsule 0  . ipratropium-albuterol (DUONEB) 0.5-2.5 (3) MG/3ML SOLN Take 3 mLs by nebulization every 4 (four) hours. (Patient taking differently: Take 3 mLs by nebulization every 4 (four) hours as needed (for shortness of breath or wheezing). ) 360 mL 12  . levothyroxine (SYNTHROID) 50 MCG tablet Take 1 tablet (50 mcg total) by mouth daily. 30 tablet 6  . losartan (COZAAR) 50 MG tablet Take 50 mg by mouth at bedtime.     . metFORMIN (GLUCOPHAGE) 500 MG tablet Take 500 mg by mouth 2 (two) times daily with a meal.     . metoprolol succinate (TOPROL-XL) 50 MG 24 hr tablet Take 50 mg by mouth at bedtime. Take with or immediately following a meal.    . Multiple Vitamins-Minerals (PRESERVISION AREDS 2) CAPS Take 1 capsule by mouth 2 (two) times daily.     . Naphazoline HCl (CLEAR EYES OP) Place 2 drops into both eyes as needed (for dry eyes).    . naproxen sodium (ANAPROX) 220 MG tablet Take 220-440 mg by mouth 2 (two) times daily as needed (for pain).    Marland Kitchen omeprazole (PRILOSEC) 20 MG capsule Take 20 mg by mouth daily as needed (for heartburn).     . simvastatin (ZOCOR) 20 MG tablet Take 20 mg by mouth at bedtime.    . triamterene-hydrochlorothiazide (MAXZIDE-25) 37.5-25 MG per tablet Take 0.5 tablets by mouth daily.    . Fluticasone-Salmeterol (ADVAIR DISKUS) 250-50 MCG/DOSE AEPB Inhale 1 puff into the lungs 2 (two) times daily. (Patient not taking: Reported on 12/23/2016) 60 each 1  . isosorbide mononitrate (IMDUR) 30 MG 24 hr tablet Take 1 tablet (30 mg total) by mouth daily. (Patient not taking: Reported on  04/19/2017) 30 tablet 3   No current facility-administered medications for this visit.     OBJECTIVE: Vitals:   04/19/17 1520  BP: (!) 162/81  Pulse: 84  Resp: 18  Temp: 98.2 F (36.8 C)     Body mass index is 41.24 kg/m.    ECOG FS:0 - Asymptomatic  General: Well-developed, well-nourished, no acute distress. Eyes: Pink conjunctiva, anicteric sclera. HEENT: Normocephalic, moist mucous membranes, clear oropharnyx. Breasts: Patient requested exam be deferred today. Lungs: Clear to auscultation bilaterally. Heart: Regular rate and rhythm. No rubs, murmurs, or gallops. Abdomen: Soft, nontender, nondistended. No organomegaly noted, normoactive bowel sounds. Musculoskeletal: No edema, cyanosis, or clubbing. Neuro: Alert, answering all questions appropriately. Cranial nerves grossly intact. Skin: No rashes or petechiae noted.  Psych: Normal affect. Lymphatics: No cervical, calvicular, axillary or inguinal LAD.   LAB RESULTS:  Lab Results  Component Value Date   NA 137 11/24/2016   K 3.7 11/24/2016   CL 103 11/24/2016   CO2 28 11/24/2016   GLUCOSE 76 11/24/2016   BUN 18 11/24/2016   CREATININE 0.72 11/24/2016   CALCIUM 9.4 11/24/2016   PROT 7.3 11/23/2016   ALBUMIN 4.0 11/23/2016   AST 26 11/23/2016   ALT 17 11/23/2016   ALKPHOS 82 11/23/2016   BILITOT 0.6 11/23/2016   GFRNONAA >60 11/24/2016   GFRAA >60 11/24/2016    Lab Results  Component Value Date   WBC 9.2 11/24/2016   NEUTROABS 4.9 12/04/2013   HGB 13.2 11/24/2016   HCT 37.7 11/24/2016   MCV 88.1 11/24/2016   PLT 302 11/24/2016     STUDIES: US Breast Ltd Uni Left Inc Axilla  Result Date: 04/02/2017 CLINICAL DATA:  83 year old female presenting for recall from screening to evaluate possible asymmetries in the bilateral breasts. EXAM: 2D DIGITAL DIAGNOSTIC BILATERAL MAMMOGRAM WITH CAD AND ADJUNCT TOMO LEFT BREAST ULTRASOUND COMPARISON:  Previous exam(s). ACR Breast Density Category b: There are scattered  areas of fibroglandular density. FINDINGS: The asymmetry in the right breast resolves with additional tomosynthesis imaging, likely corresponding with a tortuous blood vessel in inferior aspect of the breast. In the slightly upper outer quadrant of the left breast, middle depth there is a very subtle small mass which appears to have spiculations. Mammographic images were processed with CAD. No suspicious palpable masses are identified in the superior aspect of the left breast. Ultrasound targeted to the left breast at 1 o'clock, 3 cm from the nipple demonstrates an intense area of shadowing, without clearly defined mass. The area of shadowing measures approximately 4 mm in size. No suspicious lymph nodes are identified in the left axilla. IMPRESSION: 1. There is a suspicious area of shadowing in the left breast at 1 o'clock corresponding with the subtle spiculated mass identified mammographically. 2.  No evidence of left axillary lymphadenopathy. RECOMMENDATION: Ultrasound-guided biopsy is recommended for the left breast area of shadowing at 1 o'clock. I have discussed the findings and recommendations with the patient. Results were also provided in writing at the conclusion of the visit. If applicable, a reminder letter will be sent to the patient regarding the next appointment. BI-RADS CATEGORY  4: Suspicious. Electronically Signed   By: Ammie Ferrier M.D.   On: 04/02/2017 15:46   Mm Diag Breast Tomo Bilateral  Result Date: 04/02/2017 CLINICAL DATA:  83 year old female presenting for recall from screening to evaluate possible asymmetries in the bilateral breasts. EXAM: 2D DIGITAL DIAGNOSTIC BILATERAL MAMMOGRAM WITH CAD AND ADJUNCT TOMO LEFT BREAST ULTRASOUND COMPARISON:  Previous exam(s). ACR Breast Density Category b: There are scattered areas of fibroglandular density. FINDINGS: The asymmetry in the right breast resolves with additional tomosynthesis imaging, likely corresponding with a tortuous blood  vessel in inferior aspect of the breast. In the slightly upper outer quadrant of the left breast, middle depth there is a very subtle small mass which appears to have spiculations. Mammographic images were processed with CAD. No suspicious palpable masses are identified in the superior aspect of the left breast. Ultrasound targeted to the left breast at 1 o'clock, 3 cm from the nipple demonstrates an intense area of shadowing, without clearly defined mass. The area of shadowing measures approximately 4 mm in size. No suspicious lymph nodes are identified in the left axilla. IMPRESSION: 1. There is a  suspicious area of shadowing in the left breast at 1 o'clock corresponding with the subtle spiculated mass identified mammographically. 2.  No evidence of left axillary lymphadenopathy. RECOMMENDATION: Ultrasound-guided biopsy is recommended for the left breast area of shadowing at 1 o'clock. I have discussed the findings and recommendations with the patient. Results were also provided in writing at the conclusion of the visit. If applicable, a reminder letter will be sent to the patient regarding the next appointment. BI-RADS CATEGORY  4: Suspicious. Electronically Signed   By: Ammie Ferrier M.D.   On: 04/02/2017 15:46   Mm Clip Placement Left  Result Date: 04/09/2017 CLINICAL DATA:  Status post ultrasound-guided core biopsy of the left breast mass. EXAM: DIAGNOSTIC LEFT MAMMOGRAM POST ULTRASOUND BIOPSY COMPARISON:  Previous exam(s). FINDINGS: Mammographic images were obtained following ultrasound guided biopsy of a mass in the 1 o'clock region of the left breast. Mammographic images show there is a ribbon shaped clip in the upper-outer quadrant of the left breast in appropriate position. IMPRESSION: Status post ultrasound-guided core biopsy of the left breast with pathology pending. Final Assessment: Post Procedure Mammograms for Marker Placement Electronically Signed   By: Lillia Mountain M.D.   On: 04/09/2017  08:49   [place] Lt Breast Bx W Loc Dev 1st Lesion Img Bx Spec US Guide  Result Date: 04/09/2017 CLINICAL DATA:  Suspicious left breast mass. EXAM: ULTRASOUND GUIDED LEFT BREAST CORE NEEDLE BIOPSY COMPARISON:  Previous exam(s). FINDINGS: I met with the patient and we discussed the procedure of ultrasound-guided biopsy, including benefits and alternatives. We discussed the high likelihood of a successful procedure. We discussed the risks of the procedure, including infection, bleeding, tissue injury, clip migration, and inadequate sampling. Informed written consent was given. The usual time-out protocol was performed immediately prior to the procedure. Lesion quadrant: Upper-outer quadrant. Using sterile technique and 1% Lidocaine as local anesthetic, under direct ultrasound visualization, a 14 gauge spring-loaded device was used to perform biopsy of a mass in the 1 o'clock region of the left breast using a lateral to medial approach. At the conclusion of the procedure a ribbon shaped tissue marker clip was deployed into the biopsy cavity. Follow up 2 view mammogram was performed and dictated separately. IMPRESSION: Ultrasound guided biopsy of the left breast. No apparent complications. Electronically Signed   By: Lillia Mountain M.D.   On: 04/09/2017 08:50    ASSESSMENT: Clinical stage Ia ER positive, PR/HER-2 negative invasive carcinoma of the upper outer quadrant of the left breast.  PLAN:    1. Clinical stage Ia ER positive, PR/HER-2 negative invasive carcinoma of the upper outer quadrant of the left breast: Given the small size of patient's tumor and her advanced age it is unlikely chemotherapy will be necessary, but will consider sending Oncotype DX after her lumpectomy which is scheduled on April 30, 2017. Patient will require adjuvant XRT as well as an aromatase inhibitor for 5 years. Return to clinic 2 weeks after her surgery to discuss her final pathology results and additional treatment planning.  Patient will also have consultation with radiation oncology on that day.  Approximately 60 minutes was spent in discussion of which greater than 50% was consultation.  Patient expressed understanding and was in agreement with this plan. She also understands that She can call clinic at any time with any questions, concerns, or complaints.   Cancer Staging Primary cancer of upper outer quadrant of left female breast [facility]) Staging form: Breast, AJCC 8th Edition - Clinical stage from 04/17/2017: Stage  IA (cT1a, cN0, cM0, G2, ER: Positive, PR: Negative, HER2: Negative) - Signed by Lloyd Huger, MD on 04/17/2017   Lloyd Huger, MD   04/19/2017 6:16 PM

## 2017-04-19 ENCOUNTER — Encounter: Payer: Self-pay | Admitting: Diagnostic Radiology

## 2017-04-19 ENCOUNTER — Inpatient Hospital Stay: Payer: PPO | Attending: Oncology | Admitting: Oncology

## 2017-04-19 ENCOUNTER — Encounter: Payer: Self-pay | Admitting: Oncology

## 2017-04-19 DIAGNOSIS — E119 Type 2 diabetes mellitus without complications: Secondary | ICD-10-CM | POA: Insufficient documentation

## 2017-04-19 DIAGNOSIS — Z170 Estrogen receptor positive status [ER+]: Secondary | ICD-10-CM | POA: Insufficient documentation

## 2017-04-19 DIAGNOSIS — G4730 Sleep apnea, unspecified: Secondary | ICD-10-CM | POA: Diagnosis not present

## 2017-04-19 DIAGNOSIS — I10 Essential (primary) hypertension: Secondary | ICD-10-CM | POA: Insufficient documentation

## 2017-04-19 DIAGNOSIS — Z79899 Other long term (current) drug therapy: Secondary | ICD-10-CM | POA: Insufficient documentation

## 2017-04-19 DIAGNOSIS — E785 Hyperlipidemia, unspecified: Secondary | ICD-10-CM | POA: Diagnosis not present

## 2017-04-19 DIAGNOSIS — Z87891 Personal history of nicotine dependence: Secondary | ICD-10-CM | POA: Insufficient documentation

## 2017-04-19 DIAGNOSIS — Z809 Family history of malignant neoplasm, unspecified: Secondary | ICD-10-CM | POA: Diagnosis not present

## 2017-04-19 DIAGNOSIS — Z7984 Long term (current) use of oral hypoglycemic drugs: Secondary | ICD-10-CM | POA: Insufficient documentation

## 2017-04-19 DIAGNOSIS — C50412 Malignant neoplasm of upper-outer quadrant of left female breast: Secondary | ICD-10-CM | POA: Diagnosis not present

## 2017-04-19 DIAGNOSIS — Z85828 Personal history of other malignant neoplasm of skin: Secondary | ICD-10-CM | POA: Diagnosis not present

## 2017-04-19 DIAGNOSIS — E079 Disorder of thyroid, unspecified: Secondary | ICD-10-CM

## 2017-04-22 ENCOUNTER — Encounter
Admission: RE | Admit: 2017-04-22 | Discharge: 2017-04-22 | Disposition: A | Discharge status: Home / Self Care | Payer: PPO | Source: Ambulatory Visit | Attending: Surgery | Admitting: Surgery

## 2017-04-22 ENCOUNTER — Other Ambulatory Visit: Payer: PPO

## 2017-04-22 DIAGNOSIS — Z01812 Encounter for preprocedural laboratory examination: Secondary | ICD-10-CM | POA: Diagnosis not present

## 2017-04-22 DIAGNOSIS — Z01810 Encounter for preprocedural cardiovascular examination: Secondary | ICD-10-CM | POA: Diagnosis not present

## 2017-04-22 HISTORY — DX: Localized edema: R60.0

## 2017-04-22 HISTORY — DX: Malignant (primary) neoplasm, unspecified: C80.1

## 2017-04-22 HISTORY — DX: Unspecified hemorrhoids: K64.9

## 2017-04-22 HISTORY — DX: Personal history of urinary calculi: Z87.442

## 2017-04-22 HISTORY — DX: Dyspnea, unspecified: R06.00

## 2017-04-22 HISTORY — DX: Adverse effect of unspecified anesthetic, initial encounter: T41.45XA

## 2017-04-22 HISTORY — DX: Cardiac murmur, unspecified: R01.1

## 2017-04-22 HISTORY — DX: Chronic obstructive pulmonary disease, unspecified: J44.9

## 2017-04-22 HISTORY — DX: Family history of other specified conditions: Z84.89

## 2017-04-22 HISTORY — DX: Gastro-esophageal reflux disease without esophagitis: K21.9

## 2017-04-22 HISTORY — DX: Spinal stenosis, lumbar region without neurogenic claudication: M48.061

## 2017-04-22 HISTORY — DX: Hypothyroidism, unspecified: E03.9

## 2017-04-22 HISTORY — DX: Other complications of anesthesia, initial encounter: T88.59XA

## 2017-04-22 LAB — CBC
HEMATOCRIT: 44.2 % (ref 35.0–47.0)
Hemoglobin: 14.5 g/dL (ref 12.0–16.0)
MCH: 30 pg (ref 26.0–34.0)
MCHC: 32.9 g/dL (ref 32.0–36.0)
MCV: 91.3 fL (ref 80.0–100.0)
Platelets: 310 10*3/uL (ref 150–440)
RBC: 4.84 MIL/uL (ref 3.80–5.20)
RDW: 13.2 % (ref 11.5–14.5)
WBC: 9.2 10*3/uL (ref 3.6–11.0)

## 2017-04-22 LAB — BASIC METABOLIC PANEL
Anion gap: 11 (ref 5–15)
BUN: 16 mg/dL (ref 6–20)
CALCIUM: 10.3 mg/dL (ref 8.9–10.3)
CO2: 27 mmol/L (ref 22–32)
CREATININE: 0.79 mg/dL (ref 0.44–1.00)
Chloride: 101 mmol/L (ref 101–111)
GFR calc Af Amer: >60 mL/min (ref >60)
GFR calc non Af Amer: >60 mL/min (ref >60)
GLUCOSE: 105 mg/dL — AB (ref 65–99)
Potassium: 4.2 mmol/L (ref 3.5–5.1)
Sodium: 139 mmol/L (ref 135–145)

## 2017-04-22 NOTE — Patient Instructions (Signed)
Your procedure is scheduled on: November 2nd, 2018  Report to the [facility], first floor  You have been given your arrival time already.  Remember: Instructions that are not followed completely may result in serious medical risk, up to and including death, or upon the discretion of your surgeon and anesthesiologist your surgery may need to be rescheduled.     _X__ 1. Do not eat food after midnight the night before your procedure.                 No gum chewing or hard candies. You may drink clear liquids up to 2 hours                 before you are scheduled to arrive for your surgery- DO not drink clear                 liquids within 2 hours of the start of your surgery.                 Clear Liquids include:  water, apple juice without pulp, clear carbohydrate                 drink such as Clearfast of Gartorade, Black Coffee or Tea (Do not add                 anything to coffee or tea).     _X__ 2.  No Alcohol for 24 hours before or after surgery.   _X__ 3.  Do Not Smoke or use e-cigarettes For 24 Hours Prior to Your Surgery.                 Do not use any chewable tobacco products for at least 6 hours prior to                 surgery.  ____  4.  Bring all medications with you on the day of surgery if instructed.   __x__  5.  Notify your doctor if there is any change in your medical condition      (cold, fever, infections).     Do not wear jewelry, make-up, hairpins, clips or nail polish.  Do not wear lotions, powders, or perfumes. You may wear deodorant.  Do not shave 48 hours prior to surgery. Men may shave face and neck.  Do not bring valuables to the hospital.    [facility] is not responsible for any belongings or valuables.  Contacts, dentures or bridgework may not be worn into surgery. Leave your suitcase in the car. After surgery it may be brought to your room. For patients admitted to the hospital, discharge time is determined by  your treatment team.   Patients discharged the day of surgery will not be allowed to drive home.   Please read over the following fact sheets that you were given:   PREPARING FOR SURGERY             ADVANCE DIRECTIVES  __X__ Take these medicines the morning of surgery with A SIP OF WATER:    1.LEVOTHYROXINE  2. DILTIAZEM  3. PRO AIR  4.   5.  6.  ____ Fleet Enema (as directed)   __X__ Use CHG Soap as directed  __X__ Use inhalers on the day of surgery  __X__ Stop metformin 2 days prior to surgery. LAST DOSE ON October 30TH    ____ Take 1/2 of usual insulin dose the night before surgery. No insulin the  morning          of surgery.   __X__ Stop Coumadin/Plavix/ASPIRIN AS OF TODAY 04/22/2017  __X__ Stop Anti-inflammatories AS OF TODAY 04/22/2017. THIS INCLUDES MOTRIN/IBUPROFEN/ALEVE/ADVIL/NAPROXEN   _X___ Stop supplements until after surgery.  THIS INCLUDES COQ10, MULTIVITAMINS  ____ Bring C-Pap to the hospital.   CONTINUE ALL OTHER MEDICATIONS AS USUAL, UNTIL THE DAY OF SURGERY.  WEAR SOMETHING LOOSE AND COMFORTABLE SO YOU CAN GET YOUR ARM IN AND OUT EASILY.  WEAR/BRING A BRA THAT YOU CAN WEAR AROUND THE CLOCK AFTER SURGERY.

## 2017-04-22 NOTE — Pre-Procedure Instructions (Signed)
Patient states that she realizes some of her SOB is related to her stress/anxiety.  Does use her inhalers at home, with good results. Prefers to have a duoneb treatment here at the hospital on the morning of surgery as she is not sure how to use hers at home. As noted on her allergies, she is bothered by most narcotics but thinks she can take Percocet.

## 2017-04-26 DIAGNOSIS — J449 Chronic obstructive pulmonary disease, unspecified: Secondary | ICD-10-CM | POA: Diagnosis not present

## 2017-04-26 DIAGNOSIS — Z96659 Presence of unspecified artificial knee joint: Secondary | ICD-10-CM | POA: Diagnosis not present

## 2017-04-29 MED ORDER — CIPROFLOXACIN IN D5W 400 MG/200ML IV SOLN
400.0000 mg | INTRAVENOUS | Status: AC
  Administered 2017-04-30: 400 mg via INTRAVENOUS

## 2017-04-30 ENCOUNTER — Ambulatory Visit
Admission: RE | Admit: 2017-04-30 | Discharge: 2017-04-30 | Disposition: A | Discharge status: Home / Self Care | Payer: PPO | Source: Ambulatory Visit | Attending: Surgery | Admitting: Surgery

## 2017-04-30 ENCOUNTER — Encounter
Admission: RE | Disposition: A | Discharge status: Home / Self Care | Payer: Self-pay | Source: Ambulatory Visit | Attending: Surgery

## 2017-04-30 ENCOUNTER — Ambulatory Visit: Payer: PPO | Admitting: Certified Registered"

## 2017-04-30 ENCOUNTER — Encounter: Payer: Self-pay | Admitting: *Deleted

## 2017-04-30 DIAGNOSIS — K219 Gastro-esophageal reflux disease without esophagitis: Secondary | ICD-10-CM | POA: Insufficient documentation

## 2017-04-30 DIAGNOSIS — Z79899 Other long term (current) drug therapy: Secondary | ICD-10-CM | POA: Diagnosis not present

## 2017-04-30 DIAGNOSIS — J449 Chronic obstructive pulmonary disease, unspecified: Secondary | ICD-10-CM | POA: Diagnosis not present

## 2017-04-30 DIAGNOSIS — E039 Hypothyroidism, unspecified: Secondary | ICD-10-CM | POA: Diagnosis not present

## 2017-04-30 DIAGNOSIS — I10 Essential (primary) hypertension: Secondary | ICD-10-CM | POA: Diagnosis not present

## 2017-04-30 DIAGNOSIS — C50912 Malignant neoplasm of unspecified site of left female breast: Secondary | ICD-10-CM | POA: Insufficient documentation

## 2017-04-30 DIAGNOSIS — Z7984 Long term (current) use of oral hypoglycemic drugs: Secondary | ICD-10-CM | POA: Insufficient documentation

## 2017-04-30 DIAGNOSIS — Z885 Allergy status to narcotic agent status: Secondary | ICD-10-CM | POA: Insufficient documentation

## 2017-04-30 DIAGNOSIS — Z7951 Long term (current) use of inhaled steroids: Secondary | ICD-10-CM | POA: Diagnosis not present

## 2017-04-30 DIAGNOSIS — C50012 Malignant neoplasm of nipple and areola, left female breast: Secondary | ICD-10-CM

## 2017-04-30 DIAGNOSIS — Z90710 Acquired absence of both cervix and uterus: Secondary | ICD-10-CM | POA: Diagnosis not present

## 2017-04-30 DIAGNOSIS — Z882 Allergy status to sulfonamides status: Secondary | ICD-10-CM | POA: Diagnosis not present

## 2017-04-30 DIAGNOSIS — Z880 Allergy status to penicillin: Secondary | ICD-10-CM | POA: Diagnosis not present

## 2017-04-30 DIAGNOSIS — Z91041 Radiographic dye allergy status: Secondary | ICD-10-CM | POA: Diagnosis not present

## 2017-04-30 DIAGNOSIS — M1990 Unspecified osteoarthritis, unspecified site: Secondary | ICD-10-CM | POA: Diagnosis not present

## 2017-04-30 DIAGNOSIS — C50412 Malignant neoplasm of upper-outer quadrant of left female breast: Secondary | ICD-10-CM | POA: Diagnosis not present

## 2017-04-30 DIAGNOSIS — Z7982 Long term (current) use of aspirin: Secondary | ICD-10-CM | POA: Insufficient documentation

## 2017-04-30 DIAGNOSIS — Z87891 Personal history of nicotine dependence: Secondary | ICD-10-CM | POA: Insufficient documentation

## 2017-04-30 DIAGNOSIS — E119 Type 2 diabetes mellitus without complications: Secondary | ICD-10-CM | POA: Diagnosis not present

## 2017-04-30 HISTORY — PX: BREAST LUMPECTOMY: SHX2

## 2017-04-30 HISTORY — PX: BREAST LUMPECTOMY WITH NEEDLE LOCALIZATION AND AXILLARY SENTINEL LYMPH NODE BX: SHX5760

## 2017-04-30 LAB — GLUCOSE, CAPILLARY
Glucose-Capillary: 119 mg/dL — ABNORMAL HIGH (ref 65–99)
Glucose-Capillary: 139 mg/dL — ABNORMAL HIGH (ref 65–99)

## 2017-04-30 SURGERY — BREAST LUMPECTOMY WITH NEEDLE LOCALIZATION AND AXILLARY SENTINEL LYMPH NODE BX
Anesthesia: General | Site: Breast | Laterality: Left | Wound class: Clean

## 2017-04-30 MED ORDER — FENTANYL CITRATE (PF) 100 MCG/2ML IJ SOLN
25.0000 ug | INTRAMUSCULAR | Status: DC | PRN
  Administered 2017-04-30 (×4): 25 ug via INTRAVENOUS

## 2017-04-30 MED ORDER — DEXAMETHASONE SODIUM PHOSPHATE 10 MG/ML IJ SOLN
INTRAMUSCULAR | Status: DC | PRN
  Administered 2017-04-30: 5 mg via INTRAVENOUS

## 2017-04-30 MED ORDER — METHYLENE BLUE 0.5 % INJ SOLN
INTRAVENOUS | Status: DC | PRN
  Administered 2017-04-30: 2 mL

## 2017-04-30 MED ORDER — ONDANSETRON HCL 4 MG/2ML IJ SOLN
INTRAMUSCULAR | Status: AC
  Filled 2017-04-30: qty 2

## 2017-04-30 MED ORDER — FAMOTIDINE 20 MG PO TABS
20.0000 mg | ORAL_TABLET | Freq: Once | ORAL | Status: AC
  Administered 2017-04-30: 20 mg via ORAL

## 2017-04-30 MED ORDER — FENTANYL CITRATE (PF) 100 MCG/2ML IJ SOLN
INTRAMUSCULAR | Status: DC | PRN
  Administered 2017-04-30: 50 ug via INTRAVENOUS
  Administered 2017-04-30 (×2): 25 ug via INTRAVENOUS

## 2017-04-30 MED ORDER — HEPARIN SODIUM (PORCINE) 5000 UNIT/ML IJ SOLN
5000.0000 [IU] | Freq: Once | INTRAMUSCULAR | Status: AC
  Administered 2017-04-30: 5000 [IU] via SUBCUTANEOUS

## 2017-04-30 MED ORDER — CHLORHEXIDINE GLUCONATE CLOTH 2 % EX PADS
6.0000 | MEDICATED_PAD | Freq: Once | CUTANEOUS | Status: AC
  Administered 2017-04-30: 6 via TOPICAL

## 2017-04-30 MED ORDER — SODIUM CHLORIDE 0.9 % IV SOLN: 250.0000 mL | INTRAVENOUS | Status: DC | PRN

## 2017-04-30 MED ORDER — FENTANYL CITRATE (PF) 100 MCG/2ML IJ SOLN
INTRAMUSCULAR | Status: AC
  Administered 2017-04-30: 25 ug via INTRAVENOUS
  Filled 2017-04-30: qty 2

## 2017-04-30 MED ORDER — GABAPENTIN 300 MG PO CAPS
ORAL_CAPSULE | ORAL | Status: AC
  Filled 2017-04-30: qty 1

## 2017-04-30 MED ORDER — PHENYLEPHRINE HCL 10 MG/ML IJ SOLN
INTRAMUSCULAR | Status: DC | PRN
  Administered 2017-04-30: 200 ug via INTRAVENOUS
  Administered 2017-04-30 (×6): 100 ug via INTRAVENOUS

## 2017-04-30 MED ORDER — GLYCOPYRROLATE 0.2 MG/ML IJ SOLN
INTRAMUSCULAR | Status: DC | PRN
  Administered 2017-04-30: 0.2 mg via INTRAVENOUS

## 2017-04-30 MED ORDER — HYDROCODONE-ACETAMINOPHEN 5-325 MG PO TABS: 1.0000 | ORAL_TABLET | ORAL | 0 refills | Status: DC | PRN

## 2017-04-30 MED ORDER — ONDANSETRON HCL 4 MG/2ML IJ SOLN: 4.0000 mg | Freq: Once | INTRAMUSCULAR | Status: DC | PRN

## 2017-04-30 MED ORDER — BUPIVACAINE LIPOSOME 1.3 % IJ SUSP
INTRAMUSCULAR | Status: AC
  Filled 2017-04-30: qty 20

## 2017-04-30 MED ORDER — SODIUM CHLORIDE FLUSH 0.9 % IV SOLN
INTRAVENOUS | Status: AC
  Filled 2017-04-30: qty 10

## 2017-04-30 MED ORDER — ACETAMINOPHEN 500 MG PO TABS
ORAL_TABLET | ORAL | Status: AC
  Filled 2017-04-30: qty 2

## 2017-04-30 MED ORDER — EPHEDRINE SULFATE 50 MG/ML IJ SOLN
INTRAMUSCULAR | Status: AC
  Filled 2017-04-30: qty 1

## 2017-04-30 MED ORDER — SODIUM CHLORIDE 0.9 % IV SOLN
INTRAVENOUS | Status: DC
  Administered 2017-04-30: 12:00:00 via INTRAVENOUS

## 2017-04-30 MED ORDER — SUGAMMADEX SODIUM 200 MG/2ML IV SOLN
INTRAVENOUS | Status: DC | PRN
  Administered 2017-04-30: 185 mg via INTRAVENOUS

## 2017-04-30 MED ORDER — ACETAMINOPHEN 650 MG RE SUPP
650.0000 mg | RECTAL | Status: DC | PRN
  Filled 2017-04-30: qty 1

## 2017-04-30 MED ORDER — SUGAMMADEX SODIUM 200 MG/2ML IV SOLN
INTRAVENOUS | Status: AC
  Filled 2017-04-30: qty 2

## 2017-04-30 MED ORDER — FENTANYL CITRATE (PF) 100 MCG/2ML IJ SOLN
INTRAMUSCULAR | Status: AC
  Filled 2017-04-30: qty 2

## 2017-04-30 MED ORDER — SODIUM CHLORIDE 0.9% FLUSH: 3.0000 mL | Freq: Two times a day (BID) | INTRAVENOUS | Status: DC

## 2017-04-30 MED ORDER — PROPOFOL 10 MG/ML IV BOLUS
INTRAVENOUS | Status: AC
  Filled 2017-04-30: qty 20

## 2017-04-30 MED ORDER — ACETAMINOPHEN 500 MG PO TABS
1000.0000 mg | ORAL_TABLET | ORAL | Status: AC
  Administered 2017-04-30: 1000 mg via ORAL

## 2017-04-30 MED ORDER — OXYCODONE HCL 5 MG PO TABS
5.0000 mg | ORAL_TABLET | ORAL | Status: DC | PRN
  Filled 2017-04-30: qty 2

## 2017-04-30 MED ORDER — LIDOCAINE HCL (PF) 1 % IJ SOLN
INTRAMUSCULAR | Status: AC
Start: 2017-04-30 — End: 2017-04-30
  Filled 2017-04-30: qty 30

## 2017-04-30 MED ORDER — SODIUM CHLORIDE 0.9 % IJ SOLN
INTRAMUSCULAR | Status: AC
  Filled 2017-04-30: qty 20

## 2017-04-30 MED ORDER — CHLORHEXIDINE GLUCONATE CLOTH 2 % EX PADS: 6.0000 | MEDICATED_PAD | Freq: Once | CUTANEOUS | Status: DC

## 2017-04-30 MED ORDER — GABAPENTIN 300 MG PO CAPS
300.0000 mg | ORAL_CAPSULE | ORAL | Status: AC
  Administered 2017-04-30: 300 mg via ORAL

## 2017-04-30 MED ORDER — ROCURONIUM BROMIDE 100 MG/10ML IV SOLN
INTRAVENOUS | Status: DC | PRN
  Administered 2017-04-30: 25 mg via INTRAVENOUS
  Administered 2017-04-30: 5 mg via INTRAVENOUS

## 2017-04-30 MED ORDER — ACETAMINOPHEN 325 MG PO TABS: 650.0000 mg | ORAL_TABLET | ORAL | Status: DC | PRN

## 2017-04-30 MED ORDER — ONDANSETRON HCL 4 MG/2ML IJ SOLN
INTRAMUSCULAR | Status: DC | PRN
  Administered 2017-04-30: 4 mg via INTRAVENOUS

## 2017-04-30 MED ORDER — GLYCOPYRROLATE 0.2 MG/ML IJ SOLN
INTRAMUSCULAR | Status: AC
  Filled 2017-04-30: qty 1

## 2017-04-30 MED ORDER — LIDOCAINE HCL (CARDIAC) 20 MG/ML IV SOLN
INTRAVENOUS | Status: DC | PRN
  Administered 2017-04-30: 50 mg via INTRAVENOUS

## 2017-04-30 MED ORDER — SODIUM CHLORIDE 0.9% FLUSH: 3.0000 mL | INTRAVENOUS | Status: DC | PRN

## 2017-04-30 MED ORDER — METHYLENE BLUE 0.5 % INJ SOLN
INTRAVENOUS | Status: AC
  Filled 2017-04-30: qty 10

## 2017-04-30 MED ORDER — HEPARIN SODIUM (PORCINE) 5000 UNIT/ML IJ SOLN
INTRAMUSCULAR | Status: AC
  Filled 2017-04-30: qty 1

## 2017-04-30 MED ORDER — FAMOTIDINE 20 MG PO TABS
ORAL_TABLET | ORAL | Status: AC
  Filled 2017-04-30: qty 1

## 2017-04-30 MED ORDER — IPRATROPIUM-ALBUTEROL 0.5-2.5 (3) MG/3ML IN SOLN
RESPIRATORY_TRACT | Status: AC
  Filled 2017-04-30: qty 3

## 2017-04-30 MED ORDER — IPRATROPIUM-ALBUTEROL 0.5-2.5 (3) MG/3ML IN SOLN
3.0000 mL | Freq: Once | RESPIRATORY_TRACT | Status: AC
  Administered 2017-04-30: 3 mL via RESPIRATORY_TRACT

## 2017-04-30 MED ORDER — PROPOFOL 10 MG/ML IV BOLUS
INTRAVENOUS | Status: DC | PRN
  Administered 2017-04-30 (×2): 20 mg via INTRAVENOUS
  Administered 2017-04-30: 120 mg via INTRAVENOUS
  Administered 2017-04-30: 20 mg via INTRAVENOUS

## 2017-04-30 MED ORDER — TECHNETIUM TC 99M SULFUR COLLOID FILTERED
1.0000 | Freq: Once | INTRAVENOUS | Status: AC
  Administered 2017-04-30: 0.77 via INTRADERMAL

## 2017-04-30 MED ORDER — SUCCINYLCHOLINE CHLORIDE 20 MG/ML IJ SOLN
INTRAMUSCULAR | Status: DC | PRN
  Administered 2017-04-30: 100 mg via INTRAVENOUS

## 2017-04-30 MED ORDER — SODIUM CHLORIDE 0.9 % IV SOLN
INTRAVENOUS | Status: DC | PRN
  Administered 2017-04-30: 40 mL

## 2017-04-30 MED ORDER — CIPROFLOXACIN IN D5W 400 MG/200ML IV SOLN
INTRAVENOUS | Status: AC
  Filled 2017-04-30: qty 200

## 2017-04-30 MED ORDER — EPHEDRINE SULFATE 50 MG/ML IJ SOLN
INTRAMUSCULAR | Status: DC | PRN
Start: 2017-04-30 — End: 2017-04-30
  Administered 2017-04-30 (×4): 10 mg via INTRAVENOUS

## 2017-04-30 MED ORDER — LIDOCAINE HCL (PF) 2 % IJ SOLN
INTRAMUSCULAR | Status: AC
  Filled 2017-04-30: qty 10

## 2017-04-30 SURGICAL SUPPLY — 46 items
APPLIER CLIP 9.375 SM OPEN (CLIP) ×2
BANDAGE ELASTIC 6 VELCRO ST LF (GAUZE/BANDAGES/DRESSINGS) IMPLANT
BENZOIN TINCTURE PRP APPL 2/3 (GAUZE/BANDAGES/DRESSINGS) ×2 IMPLANT
BINDER BREAST LRG (GAUZE/BANDAGES/DRESSINGS) ×2 IMPLANT
BINDER BREAST XLRG (GAUZE/BANDAGES/DRESSINGS) IMPLANT
CANISTER SUCTION 2500CC (MISCELLANEOUS) IMPLANT
CLEANER TIP ELECTROSURG 2X2 (MISCELLANEOUS) ×2 IMPLANT
CLIP APPLIE 9.375 SM OPEN (CLIP) ×1 IMPLANT
CONT SPEC 4OZ CLIKSEAL STRL BL (MISCELLANEOUS) IMPLANT
COVER PROBE W GEL 5X96 (DRAPES) IMPLANT
COVER SURGICAL LIGHT HANDLE (MISCELLANEOUS) ×2 IMPLANT
DECANTER SPIKE VIAL GLASS SM (MISCELLANEOUS) IMPLANT
DERMABOND ADVANCED (GAUZE/BANDAGES/DRESSINGS) ×1
DERMABOND ADVANCED .7 DNX12 (GAUZE/BANDAGES/DRESSINGS) ×1 IMPLANT
DEVICE DUBIN SPECIMEN MAMMOGRA (MISCELLANEOUS) IMPLANT
DRAPE LAPAROTOMY T 98X78 PEDS (DRAPES) ×2 IMPLANT
DRSG PAD ABDOMINAL 8X10 ST (GAUZE/BANDAGES/DRESSINGS) IMPLANT
ELECT CAUTERY BLADE TIP 2.5 (TIP) ×2
ELECT REM PT RETURN 9FT ADLT (ELECTROSURGICAL) ×2
ELECTRODE CAUTERY BLDE TIP 2.5 (TIP) ×1 IMPLANT
ELECTRODE REM PT RTRN 9FT ADLT (ELECTROSURGICAL) ×1 IMPLANT
GAUZE SPONGE 4X4 12PLY STRL (GAUZE/BANDAGES/DRESSINGS) ×2 IMPLANT
GAUZE SPONGE 4X4 16PLY XRAY LF (GAUZE/BANDAGES/DRESSINGS) ×2 IMPLANT
GLOVE BIO SURGEON STRL SZ8 (GLOVE) ×2 IMPLANT
GOWN STRL REUS W/ TWL LRG LVL3 (GOWN DISPOSABLE) ×1 IMPLANT
GOWN STRL REUS W/TWL 2XL LVL3 (GOWN DISPOSABLE) ×2 IMPLANT
GOWN STRL REUS W/TWL LRG LVL3 (GOWN DISPOSABLE) ×1
KIT BASIN OR (CUSTOM PROCEDURE TRAY) IMPLANT
KIT ROOM TURNOVER OR (KITS) ×2 IMPLANT
NEEDLE 18GX1X1/2 (RX/OR ONLY) (NEEDLE) IMPLANT
NEEDLE HYPO 25GX1X1/2 BEV (NEEDLE) IMPLANT
NS IRRIG 1000ML POUR BTL (IV SOLUTION) ×2 IMPLANT
PACK SURGICAL SETUP 50X90 (CUSTOM PROCEDURE TRAY) ×2 IMPLANT
PAD ARMBOARD 7.5X6 YLW CONV (MISCELLANEOUS) ×2 IMPLANT
PENCIL BUTTON HOLSTER BLD 10FT (ELECTRODE) IMPLANT
STRIP CLOSURE SKIN 1/2X4 (GAUZE/BANDAGES/DRESSINGS) ×2 IMPLANT
SUT SILK 2 0 FS (SUTURE) IMPLANT
SUT VIC AB 4-0 SH 18 (SUTURE) ×2 IMPLANT
SUT VICRYL 3-0 SH-1 18IN (SUTURE) ×2 IMPLANT
SYR BULB IRRIG 60ML STRL (SYRINGE) ×2 IMPLANT
SYR CONTROL 10ML (SYRINGE) IMPLANT
TOWEL OR 17X26 10 PK STRL BLUE (TOWEL DISPOSABLE) IMPLANT
TOWEL OR 17X26 4PK STRL BLUE (TOWEL DISPOSABLE) IMPLANT
TUBING CONNECTING 10 (TUBING) IMPLANT
WATER STERILE IRR 1000ML POUR (IV SOLUTION) IMPLANT
YANKAUER SUCT BULB TIP NO VENT (SUCTIONS) IMPLANT

## 2017-04-30 NOTE — Interval H&P Note (Signed)
History and Physical Interval Note:  04/30/2017 1:02 PM  Kelsey Young  has presented today for surgery, with the diagnosis of LEFT BREAST CANCER  The various methods of treatment have been discussed with the patient and family. After consideration of risks, benefits and other options for treatment, the patient has consented to  Procedure(s): LEFT BREAST LUMPECTOMY WITH NEEDLE LOCALIZATION AND LEFT AXILLARY SENTINEL LYMPH NODE BX (Left) as a surgical intervention .  The patient's history has been reviewed, patient examined, no change in status, stable for surgery.  I have reviewed the patient's chart and labs.  Questions were answered to the patient's satisfaction.     Kazuto Sevey B

## 2017-04-30 NOTE — Anesthesia Procedure Notes (Signed)
Procedure Name: Intubation Performed by: Rolla Plate Pre-anesthesia Checklist: Patient identified, Patient being monitored, Timeout performed, Emergency Drugs available and Suction available Patient Re-evaluated:Patient Re-evaluated prior to induction Oxygen Delivery Method: Circle system utilized Preoxygenation: Pre-oxygenation with 100% oxygen Induction Type: IV induction, Rapid sequence and Cricoid Pressure applied Laryngoscope Size: Miller and 2 Grade View: Grade II Tube type: Oral Tube size: 7.0 mm Number of attempts: 1 Airway Equipment and Method: Stylet and Patient positioned with wedge pillow Placement Confirmation: ETT inserted through vocal cords under direct vision,  positive ETCO2 and breath sounds checked- equal and bilateral Secured at: 21 cm Tube secured with: Tape Dental Injury: Teeth and Oropharynx as per pre-operative assessment

## 2017-04-30 NOTE — Anesthesia Preprocedure Evaluation (Signed)
Anesthesia Evaluation  Patient identified by MRN, date of birth, ID band Patient awake    Reviewed: Allergy & Precautions, NPO status , Patient's Chart, lab work & pertinent test results  History of Anesthesia Complications (+) PROLONGED EMERGENCE, Family history of anesthesia reaction and history of anesthetic complications  Airway Mallampati: IV  TM Distance: <3 FB     Dental  (+) Chipped   Pulmonary shortness of breath and with exertion, sleep apnea , COPD,  COPD inhaler, former smoker,           Cardiovascular hypertension, Pt. on medications and Pt. on home beta blockers Normal cardiovascular exam     Neuro/Psych negative neurological ROS     GI/Hepatic GERD  Medicated,  Endo/Other  diabetes, Well Controlled, Type 2, Oral Hypoglycemic AgentsHypothyroidism   Renal/GU      Musculoskeletal  (+) Arthritis , Osteoarthritis,    Abdominal (+) + obese,   Peds  Hematology   Anesthesia Other Findings   Reproductive/Obstetrics                             Anesthesia Physical Anesthesia Plan  ASA: III  Anesthesia Plan: General   Post-op Pain Management:    Induction: Intravenous, Rapid sequence and Cricoid pressure planned  PONV Risk Score and Plan: 3 and Ondansetron, Dexamethasone, Midazolam and Propofol infusion  Airway Management Planned: Oral ETT  Additional Equipment:   Intra-op Plan:   Post-operative Plan: Extubation in OR  Informed Consent: I have reviewed the patients History and Physical, chart, labs and discussed the procedure including the risks, benefits and alternatives for the proposed anesthesia with the patient or authorized representative who has indicated his/her understanding and acceptance.   Dental advisory given  Plan Discussed with: CRNA and Surgeon  Anesthesia Plan Comments:         Anesthesia Quick Evaluation

## 2017-04-30 NOTE — Anesthesia Post-op Follow-up Note (Signed)
Anesthesia QCDR form completed.        

## 2017-04-30 NOTE — Anesthesia Postprocedure Evaluation (Signed)
Anesthesia Post Note  Patient: Kelsey Young  Procedure(s) Performed: LEFT BREAST LUMPECTOMY WITH NEEDLE LOCALIZATION AND LEFT AXILLARY SENTINEL LYMPH NODE BX (Left Breast)  Patient location during evaluation: PACU Anesthesia Type: General Level of consciousness: awake and alert Pain management: pain level controlled Vital Signs Assessment: post-procedure vital signs reviewed and stable Respiratory status: spontaneous breathing, nonlabored ventilation, respiratory function stable and patient connected to nasal cannula oxygen Cardiovascular status: blood pressure returned to baseline and stable Postop Assessment: no apparent nausea or vomiting Anesthetic complications: no     Last Vitals:  Vitals:   04/30/17 1630 04/30/17 1654  BP: 132/63 135/63  Pulse: 91 96  Resp: 14 14  Temp: (!) 36.2 C (!) 36.2 C  SpO2: 94% 93%    Last Pain:  Vitals:   04/30/17 1654  TempSrc: Temporal  PainSc: [place]

## 2017-04-30 NOTE — Op Note (Signed)
Surgeon: Kaylyn Lim, MD, FACS  Asst:  none  Anes:  general  Preop Dx: Left breast cancer Postop Dx: same  Procedure: Left axillary sentinel node mapping and sentinel node biopsy;  Wire localized left breast lumpectomy Location Surgery: [facility] Room 7 Complications: None noted  EBL:   10 cc  Drains: none  Description of Procedure:  The patient was taken to OR 7 .  After anesthesia was administered and the patient was prepped a timeout was performed.  Mapping of axilla was performed and area marked.  An incision was made in the axilla and carried down through the axillary fascia. The neoprobe was used to direct the blunt and sharp dissection down to the blue and hot node. This was removed from the surrounding tissue and labeled sentinel node #1. It had the highest counts. Further mapping found the hot area immediately behind that I went ahead and excised it as well. Vicryl was used to him with for hemostasis along with a small clips. In excising the second area I found a non-dyed node and sent it as a third specimen.  The area was irrigated and bleeding had been controlled. The area was packed with a moist gauze. Attention was then directed to the wire localization. I elected to create a transverse incision above the areolar margin and carried deep and intercepted the wire. The wire was 6 cm deep to the skin. When I had reached that I transfix the wire to the surrounding tissue with a 3-0 Vicryl. I then used scissor dissection to get around this wire tip in toto palpated anything that felt of substance was excised. The patient had a very fatty breast as expected at her age. This was then removed and a specimen mammogram revealed that the clip had been successfully included specimen. At the end of the case I went to the pathology lab and oriented the specimen for the pathologist.  The wounds were injected with ex Brill diluted to 40 mL including 10 mL in the axilla and 30 mL in the lumpectomy  cavity. Bleeding was controlled lumpectomy cavity where there was a vessel which was oversewn with figure-of-eight sutures of 3-0 Vicryl. The cavity was dry at the completion of the case. Wounds were closed with 3-0 Vicryl subcutaneously subcuticularly and with Dermabond. Patient tolerated the procedure well as taken recovery room in satisfactory condition.  The patient tolerated the procedure well and was taken to the PACU in stable condition.     Matt B. Hassell Done, [place], [facility], [place]

## 2017-04-30 NOTE — H&P (View-Only) (Signed)
LAJUNE PERINE 04/15/2017 10:18 AM Location: [place] Office Patient #: [id number] DOB: [birth year] Widowed / Language: Cleophus Molt / Race: White Female   History of Present Illness Rodman Key B. Hassell Done MD; 04/15/2017 11:09 AM) The patient is a 83 year old female who presents with breast cancer. Ms. Kaigler is referred by Dr. Humphrey Rolls with newly diagnosed left breast cancer. She is an 83 year old white female who was found on recent mammogram to have a small spiculated lesion in the left breast at the 1 o'clock position about 3 cm away from the areolar margin. A core biopsy was taken with ultrasound guidance that showed invasive cancer. Ultrasound did not show any enlarged lymph nodes.  I have discussed needle localized left breast lumpectomy and left axillary sentinel node biopsy with her. She has an appointment scheduled with oncology next week. I will be at the [facility] through next Wednesday and could possibly do her on either Thursday or Friday of next week or the following week. She is also having to deal with her granddaughter who has a newborn and has a choriocarcinoma.  She asked about the flu shot and about a shingles shot. I told her that the flu shot would be good to take now; I would defer to Dr. Humphrey Rolls about the shot for shingles.  Ms. Rooks came in with her cane but seems very independent and her fund of knowledge and questions are good. She has some lumbar spinal stenosis, high blood pressure, hyperlipidemia, acid reflux, type 2 diabetes.   Past Surgical History Malachi Bonds, CMA; 04/15/2017 10:31 AM) Appendectomy  Oral Surgery  Tonsillectomy  Thyroid Surgery  Breast Biopsy - Left  Cataract Surgery - Both  Hip Surgery - Both  Knee Surgery- Left  Gallbladder Surgery - Laparoscopic  Hysterectomy (not due to cancer) - Partial   Allergies (Chemira Jones, CMA; 04/15/2017 10:20 AM) Iodinated Diagnostic Agents  Sulfur *DERMATOLOGICALS*   Codeine Sulfate *ANALGESICS - OPIOID*  Morphine Sulfate *ANALGESICS - OPIOID*  OxyCODONE HCl *ANALGESICS - OPIOID*  Penicillins   Medication History (Chemira Jones, CMA; 04/15/2017 10:26 AM) Geronimo Boot XT (300MG  Capsule ER 24HR, Oral) Active. Levothyroxine Sodium (50MCG Tablet, Oral) Active. Ipratropium-Albuterol (0.5-2.5 (3)MG/3ML Solution, Inhalation) Active. Losartan Potassium (50MG  Tablet, Oral) Active. MetFORMIN HCl (500MG  Tablet, Oral) Active. Metoprolol Tartrate (50MG  Tablet, Oral) Active. Pravastatin Sodium (40MG  Tablet, Oral) Active. ProAir HFA (108 (90 Base)MCG/ACT Aerosol Soln, Inhalation) Active. Triamterene-HCTZ (37.5-25MG  Tablet, Oral) Active. PredniSONE (10MG  Tablet, Oral) Active. Aspirin (81MG  Tablet, Oral) Active. Coenzyme Q10 (10MG  Capsule, Oral) Active. Advair Diskus (250-50MCG/DOSE Aero Pow Br Act, Inhalation) Active. Isosorbide Mononitrate (30MG  Tablet ER, Oral) Active. Multivitamin Adult (Oral) Active. Medications Reconciled  Social History Malachi Bonds, CMA; 04/15/2017 10:28 AM) Current tobacco use  Former smoker. Alcohol use  Moderate alcohol use. Caffeine use  Coffee, Tea, Carbonated beverages. No drug use   Family History Malachi Bonds, CMA; 04/15/2017 10:29 AM) Arthritis  Father, Brother, Son, Daughter. Depression  Mother. Diabetes Mellitus  Father. Heart Disease  Father, Brother. High Blood Pressure / Hypertension  Father, Mother. Lung/Respiratory Disease  Brother. Melanoma  Brother. Migraine Headache  Daughter. Stroke / CVA of the Brain  Mother. Other Cancer   Pregnancy / Birth History Malachi Bonds, CMA; 04/15/2017 10:40 AM) Pregnancies ([place])  10. Deliveries ([place])  4. Age of menopause  <47. Mammogram  within the last year. Pap smear  1-5 years ago. Age at menarche  39 years.  Other Problems Malachi Bonds, CMA; 04/15/2017 10:40 AM) Acid Reflux / GERD  Arthritis  Asthma  Back Pain   Hemorrhoids  High blood pressure  Thyroid Disease  COPD  Diabetes  Gallstones  High Cholesterol  Kidney Stone  Sleep Apnea     Review of Systems (Chemira Jones CMA; 04/15/2017 10:43 AM) Skin Present- Dryness. HEENT Present- Seasonal Allergies, Sinus Pain, Visual Disturbances and Wears glasses/contact lenses. Respiratory Present- Difficulty Breathing and Snoring. Breast Present- Breast Mass. Cardiovascular Present- Rapid Heart Rate, Shortness of Breath and Swelling of Extremities. Gastrointestinal Present- Bloody Stool, Constipation, Hemorrhoids and Rectal Pain. Female Genitourinary Present- Frequency and Urgency. Musculoskeletal Present- Back Pain, Joint Pain and Joint Stiffness. Psychiatric Present- Fearful. Endocrine Present- Hair Changes and Heat Intolerance. Hematology Present- Gland problems.  Vitals (Chemira Jones CMA; 04/15/2017 10:19 AM) 04/15/2017 10:19 AM Weight: 202.6 lb Height: 59in Body Surface Area: 1.85 m Body Mass Index: 40.92 kg/m  Pulse: 79 (Regular)  BP: 130/78 (Sitting, Left Arm, Standard)       Physical Exam (Cecila Satcher B. Hassell Done MD; 04/15/2017 11:11 AM) The physical exam findings are as follows: Note:Elderly WF NAD but upset about her condition and that of her granddaughter Skin Many solar changes and hx of dermatologic surgery HEENT unremarkable Chest clear Heart SR Breast: left breast with upper outer quadrant steristrips in place over the site of the biopsy. Not much hematoma noted. No axillary adenopathy palpated.    Assessment & Plan Rodman Key B. Hassell Done MD; 04/15/2017 11:12 AM) BREAST CANCER, LEFT (C50.912) Impression: Schedule left needle localized lumpectomy of breast with sentinel axillary node mapping and biopsy at [facility] under general anesthesia.  Matt B. Hassell Done, MD, FACS

## 2017-04-30 NOTE — Transfer of Care (Signed)
Immediate Anesthesia Transfer of Care Note  Patient: Kelsey Young  Procedure(s) Performed: LEFT BREAST LUMPECTOMY WITH NEEDLE LOCALIZATION AND LEFT AXILLARY SENTINEL LYMPH NODE BX (Left Breast)  Patient Location: PACU  Anesthesia Type:General  Level of Consciousness: awake  Airway & Oxygen Therapy: Patient Spontanous Breathing and Patient connected to face mask oxygen  Post-op Assessment: Report given to RN and Post -op Vital signs reviewed and stable  Post vital signs: Reviewed  Last Vitals:  Vitals:   04/30/17 1101 04/30/17 1526  BP: (!) 137/49 (!) 136/51  Pulse: 68 97  Resp: 16 15  Temp: 36.5 C   SpO2: 97% 96%    Last Pain:  Vitals:   04/30/17 1101  TempSrc: Oral  PainSc: 1          Complications: No apparent anesthesia complications

## 2017-04-30 NOTE — Discharge Instructions (Addendum)
Lumpectomy, Care After This sheet gives you information about how to care for yourself after your procedure. Your health care provider may also give you more specific instructions. If you have problems or questions, contact your health care provider. What can I expect after the procedure? After the procedure, it is common to have:  Breast swelling.  Breast tenderness.  Stiffness in your arm or shoulder.  A change in the shape and feel of your breast.  Scar tissue that feels hard to the touch in the area where the lump was removed.  Follow these instructions at home: Bathing  Take sponge baths until your health care provider says that you can start showering or bathing.  Do not take baths, swim, or use a hot tub until your health care provider approves. Incision care  Follow instructions from your health care provider about how to take care of your incision. Make sure you: ? Wash your hands with soap and water before you change your bandage (dressing). If soap and water are not available, use hand sanitizer. ? Change your dressing as told by your health care provider. ? Leave stitches (sutures), skin glue, or adhesive strips in place. These skin closures may need to stay in place for 2 weeks or longer. If adhesive strip edges start to loosen and curl up, you may trim the loose edges. Do not remove adhesive strips completely unless your health care provider tells you to do that.  Check your incision area every day for signs of infection. Check for: ? More redness, swelling, or pain. ? More fluid or blood. ? Warmth. ? Pus or a bad smell.  Keep your dressing clean and dry.  If you were sent home with a surgical drain in place, follow instructions from your health care provider about emptying it. Activity  Return to your normal activities as told by your health care provider. Ask your health care provider what activities are safe for you.  Avoid activities that require a lot of  energy (are strenuous).  Be careful to avoid any activities that could cause an injury to your arm on the side of your surgery.  Do not lift anything that is heavier than 10 lb (4.5 kg). Avoid lifting with the arm that is on the side of your surgery.  Do not carry heavy objects on your shoulder.  After your drain is removed, you should perform exercises to keep your arm from getting stiff and swollen. Talk with your health care provider about which exercises are safe for you. General instructions  Take over-the-counter and prescription medicines only as told by your health care provider.  You may eat what you usually do.  Wear a supportive bra as told by your health care provider.  Raise (elevate) your arm above the level of your heart while you are sitting or lying down.  Do not wear tight jewelry on your arm, wrist, or fingers on the side of your surgery. Follow-up  Keep all follow-up visits as told by your health care provider. This is important.  You may need to be screened for extra fluid around the lymph nodes (lymphedema). Follow instructions from your health care provider about how often you should be checked.  If you had any lymph nodes removed during your procedure, be sure to tell all of your health care providers. This is important information to share before you are involved in certain procedures, such as giving blood or having your blood pressure taken. Contact a health care  provider if:  You develop a rash.  You have a fever.  Your pain medicine is not working.  Your swelling, weakness, or numbness in your arm has not improved after a few weeks.  You have new swelling in your breast or arm.  You have more redness, swelling, or pain in your incision area.  You have more fluid or blood coming from your incision.  Your incision feels warm to the touch.  You have pus or a bad smell coming from your incision. Get help right away if:  You have very bad pain in  your breast or arm.  You have chest pain.  You have difficulty breathing. This information is not intended to replace advice given to you by your health care provider. Make sure you discuss any questions you have with your health care provider. Document Released: 07/01/2006 Document Revised: 02/26/2016 Document Reviewed: 02/26/2016 Elsevier Interactive Patient Education  2018 [place] An incentive spirometer is a tool that measures how well you are filling your lungs with each breath. This tool can help keep your lungs clear and active. Taking long, deep breaths may help reverse or decrease the chance of developing breathing (pulmonary) problems, especially infection, following:  Surgery of the chest or abdomen.  Surgery if you have a history of smoking or a lung problem.  A long period of time when you are unable to move or be active.  If the spirometer includes an indicator to show your best effort, your health care provider or respiratory therapist will help you set a goal. Keep a log of your progress if directed by your health care provider. What are the risks?  Breathing too quickly may cause dizziness or cause you to pass out. Take your time so you do not get dizzy or lightheaded.  If you are in pain, you may need to take or ask for pain medicine before doing incentive spirometry. It is harder to take a deep breath if you are having pain. How to use your incentive spirometer 1. Sit on the edge of your bed if possible, or sit up as far as you can in bed or on a chair. 2. Hold the incentive spirometer in an upright position. 3. Breathe out normally. 4. Place the mouthpiece in your mouth and seal your lips tightly around it. 5. Breathe in slowly and as deeply as possible, raising the piston or the ball toward the top of the column. 6. Hold your breath for 3-5 seconds or for as long as possible. Allow the piston or ball to fall to the bottom of the  column. 7. Remove the mouthpiece from your mouth and breathe out normally. 8. The spirometer may include an indicator to show your best effort. Use the indicator as a goal to work toward during each repetition. 9. Rest for a few seconds and repeat this at least 10 times, every 1-2 hours when you are awake. Take your time and take a few normal breaths between deep breaths. Breathing too quickly may cause dizziness or cause you to pass out. Take your time so you do not get dizzy or lightheaded. 10. After each set of 10 deep breaths, practice coughing to be sure your lungs are clear. If you had a surgical cut (incision) made during surgery, support your incision when coughing by placing a pillow or rolled-up towel firmly against it. Once you are able to get out of bed, walk around indoors and cough well. You may stop using  the incentive spirometer when instructed by your health care provider. Contact a health care provider if:  You are having difficulty using the spirometer.  You have trouble using the spirometer as often as instructed.  Your pain medicine is not giving enough relief while using the spirometer.  You have a fever.  You develop shortness of breath. Get help right away if:  You develop a cough with bloody sputum.  You develop worsening pain, redness, or discharge at or near the incision site. This information is not intended to replace advice given to you by your health care provider. Make sure you discuss any questions you have with your health care provider. Document Released: 10/26/2006 Document Revised: 03/09/2016 Document Reviewed: 01/22/2014 Elsevier Interactive Patient Education  2018 [place]   1) The drugs that you were given will stay in your system until tomorrow so for the next 24 hours you should not:  A) Drive an automobile B) Make any legal decisions C) Drink any alcoholic beverage   2) You may resume  regular meals tomorrow.  Today it is better to start with liquids and gradually work up to solid foods.  You may eat anything you prefer, but it is better to start with liquids, then soup and crackers, and gradually work up to solid foods.   3) Please notify your doctor immediately if you have any unusual bleeding, trouble breathing, redness and pain at the surgery site, drainage, fever, or pain not relieved by medication.    4) Additional Instructions:        Please contact your physician with any problems or Same Day Surgery at [phone], Monday through Friday 6 am to 4 pm, or [place] at [facility] number at [phone].

## 2017-05-01 ENCOUNTER — Encounter: Payer: Self-pay | Admitting: Surgery

## 2017-05-04 LAB — SURGICAL PATHOLOGY

## 2017-05-09 NOTE — Progress Notes (Signed)
[name]  [phone] [phone]) [phone]  ID: Kelsey Young OB: [birth year]  MR#: [id number]  WSF#:[id number]  Patient Care Team: Perrin Maltese, MD as PCP - General (Internal Medicine)  CHIEF COMPLAINT: Pathologic stage Ia ER positive, PR/HER-2 negative invasive carcinoma of the upper outer quadrant of the left breast.  INTERVAL HISTORY: Patient returns to clinic today for further evaluation and discussion of her final pathology results.  She tolerated her lumpectomy well without significant side effects. She has no neurologic complaints. She denies any recent fevers or illnesses. She has a good appetite and denies weight loss. She has no chest pain or shortness of breath. She denies any nausea, vomiting, constipation, or diarrhea. She has no urinary complaints. Patient offer no further specific complaints today.  REVIEW OF SYSTEMS:   Review of Systems  Constitutional: Negative.  Negative for fever, malaise/fatigue and weight loss.  Respiratory: Negative.  Negative for cough and shortness of breath.   Cardiovascular: Negative.  Negative for chest pain and leg swelling.  Gastrointestinal: Negative.  Negative for abdominal pain.  Genitourinary: Negative.   Musculoskeletal: Negative.   Skin: Negative.  Negative for rash.  Neurological: Negative.  Negative for weakness.  Psychiatric/Behavioral: Negative.  The patient is not nervous/anxious.     As per HPI. Otherwise, a complete review of systems is negative.  PAST MEDICAL HISTORY: Past Medical History:  Diagnosis Date  . Cancer ([place]) 03/2017   left breast  . Complication of anesthesia    has been slow to wake up  . COPD (chronic obstructive pulmonary disease) ([place])   . Diabetes mellitus without complication ([place])   . Dyspnea   . Edema leg 03/2017   bilateral swelling  . Family history of adverse reaction to anesthesia    son had surgery and heart stopped during procedure  . GERD  (gastroesophageal reflux disease)   . Heart murmur    no treatment  . Hemorrhoids 03/2017  . History of kidney stones    had eswl  . Hyperlipidemia   . Hypertension   . Hypothyroidism   . Lumbar stenosis   . Skin cancer 2016,2017,2018   face, back of neck, shoulder, leg, hand  . Sleep apnea    does not and will not use a cpap  . Thyroid disease     PAST SURGICAL HISTORY: Past Surgical History:  Procedure Laterality Date  . ABDOMINAL HYSTERECTOMY  1982  . APPENDECTOMY  1940  . BREAST EXCISIONAL BIOPSY Left 04/30/2017   left breast lumpectomy +  . CHOLECYSTECTOMY    . EYE SURGERY Bilateral 2003   cataract extraction  . implants  2003   dental, not removable  . JOINT REPLACEMENT Bilateral [id number]   total hip replacements  . JOINT REPLACEMENT Right 2014   right total knee replacement  . SKIN CANCER EXCISION     left hand and right lower leg  . THYROIDECTOMY, PARTIAL  2003  . TONSILLECTOMY      FAMILY HISTORY: Family History  Problem Relation Age of Onset  . Heart failure Mother   . Heart attack Father   . Diabetes Brother   . Heart disease Brother   . Melanoma Brother   . Cancer Maternal Uncle   . Diabetes Paternal Aunt   . Heart disease Paternal Aunt   . Heart disease Paternal Grandmother   . Diabetes Maternal Aunt   . Heart attack Maternal Aunt   . Diabetes Maternal Aunt   . Diabetes Paternal Aunt   .  Heart disease Paternal Aunt   . Breast cancer Neg Hx     ADVANCED DIRECTIVES (Y/N):  N  HEALTH MAINTENANCE: Social History   Tobacco Use  . Smoking status: Former Smoker    Packs/day: 1.50    Types: Cigarettes    Last attempt to quit: 06/29/1985    Years since quitting: 31.8  . Smokeless tobacco: Never Used  Substance Use Topics  . Alcohol use: Yes    Alcohol/week: 0.6 - 1.2 oz    Types: 1 - 2 Standard drinks or equivalent per week    Comment: wine 3 times a week  . Drug use: No     Colonoscopy:  PAP:  Bone density:  Lipid  panel:  Allergies  Allergen Reactions  . Contrast Media [Iodinated Diagnostic Agents] Hives, Shortness Of Breath and Other (See Comments)    Restless. Difficulty breathing. Topical betadine okay   . Sulfur Hives and Shortness Of Breath  . Codeine Nausea And Vomiting    With high doses, passes out  . Morphine Nausea And Vomiting    With high doses, passes out  . Oxycodone Nausea And Vomiting and Other (See Comments)    Dizzy and with high doses, passes out.  Marland Kitchen Penicillins Other (See Comments)    "Hardened lump" Has patient had a PCN reaction causing immediate rash, facial/tongue/throat swelling, SOB or lightheadedness with hypotension: No Has patient had a PCN reaction causing severe rash involving mucus membranes or skin necrosis: No Has patient had a PCN reaction that required hospitalization No Has patient had a PCN reaction occurring within the last 10 years: No If all of the above answers are "NO", then may proceed with Cephalosporin use.    . Sulfa Antibiotics Rash    Current Outpatient Medications  Medication Sig Dispense Refill  . albuterol (PROVENTIL HFA;VENTOLIN HFA) 108 (90 Base) MCG/ACT inhaler Inhale 2 puffs into the lungs every 4 (four) hours as needed for wheezing or shortness of breath. 1 Inhaler 10  . aspirin EC 81 MG EC tablet Take 1 tablet (81 mg total) by mouth daily. 30 tablet 3  . Coenzyme Q10 (CO Q 10 PO) Take 100 mg by mouth daily.     Marland Kitchen diltiazem (CARDIZEM CD) 300 MG 24 hr capsule Take 1 capsule (300 mg total) by mouth daily. (Patient taking differently: Take 300 mg by mouth at bedtime. ) 7 capsule 0  . Fluticasone-Salmeterol (ADVAIR DISKUS) 250-50 MCG/DOSE AEPB Inhale 1 puff into the lungs 2 (two) times daily. 60 each 1  . HYDROcodone-acetaminophen (NORCO/VICODIN) 5-325 MG tablet Take 1 tablet by mouth every 4 (four) hours as needed for moderate pain or severe pain. 40 tablet 0  . ipratropium-albuterol (DUONEB) 0.5-2.5 (3) MG/3ML SOLN Take 3 mLs by  nebulization every 4 (four) hours. 360 mL 12  . isosorbide mononitrate (IMDUR) 30 MG 24 hr tablet Take 1 tablet (30 mg total) by mouth daily. 30 tablet 3  . levothyroxine (SYNTHROID) 50 MCG tablet Take 1 tablet (50 mcg total) by mouth daily. 30 tablet 6  . losartan (COZAAR) 50 MG tablet Take 50 mg by mouth at bedtime.     . metFORMIN (GLUCOPHAGE) 500 MG tablet Take 500 mg by mouth 2 (two) times daily with a meal.     . metoprolol succinate (TOPROL-XL) 50 MG 24 hr tablet Take 50 mg by mouth at bedtime. Take with or immediately following a meal.    . Multiple Vitamins-Minerals (PRESERVISION AREDS 2) CAPS Take 1 capsule by mouth 2 (  two) times daily.     . Naphazoline HCl (CLEAR EYES OP) Place 2 drops into both eyes as needed (for dry eyes).    . naproxen sodium (ANAPROX) 220 MG tablet Take 220-440 mg by mouth 2 (two) times daily as needed (for pain).    Marland Kitchen omeprazole (PRILOSEC) 20 MG capsule Take 20 mg by mouth daily as needed (for heartburn).     . pravastatin (PRAVACHOL) 40 MG tablet     . simvastatin (ZOCOR) 20 MG tablet Take 20 mg by mouth at bedtime.    . triamterene-hydrochlorothiazide (MAXZIDE-25) 37.5-25 MG per tablet Take 0.5 tablets by mouth daily.     No current facility-administered medications for this visit.     OBJECTIVE: Vitals:   05/10/17 1139  BP: (!) 163/82  Pulse: 76  Resp: 20  Temp: (!) 97.5 F (36.4 C)     Body mass index is 41.67 kg/m.    ECOG FS:0 - Asymptomatic  General: Well-developed, well-nourished, no acute distress. Eyes: Pink conjunctiva, anicteric sclera. Breasts: Patient requested exam be deferred today. Lungs: Clear to auscultation bilaterally. Heart: Regular rate and rhythm. No rubs, murmurs, or gallops. Abdomen: Soft, nontender, nondistended. No organomegaly noted, normoactive bowel sounds. Musculoskeletal: No edema, cyanosis, or clubbing. Neuro: Alert, answering all questions appropriately. Cranial nerves grossly intact. Skin: No rashes or  petechiae noted. Psych: Normal affect.   LAB RESULTS:  Lab Results  Component Value Date   NA 139 04/22/2017   K 4.2 04/22/2017   CL 101 04/22/2017   CO2 27 04/22/2017   GLUCOSE 105 (H) 04/22/2017   BUN 16 04/22/2017   CREATININE 0.79 04/22/2017   CALCIUM 10.3 04/22/2017   PROT 7.3 11/23/2016   ALBUMIN 4.0 11/23/2016   AST 26 11/23/2016   ALT 17 11/23/2016   ALKPHOS 82 11/23/2016   BILITOT 0.6 11/23/2016   GFRNONAA >60 04/22/2017   GFRAA >60 04/22/2017    Lab Results  Component Value Date   WBC 9.2 04/22/2017   NEUTROABS 4.9 12/04/2013   HGB 14.5 04/22/2017   HCT 44.2 04/22/2017   MCV 91.3 04/22/2017   PLT 310 04/22/2017     STUDIES: Nm Sentinel Node Injection  Result Date: 04/30/2017 CLINICAL DATA:  Left breast cancer. EXAM: NUCLEAR MEDICINE BREAST LYMPHOSCINTIGRAPHY LEFT BREAST TECHNIQUE: Intradermal injection of radiopharmaceutical was performed at the 12 o'clock, 3 o'clock, 6 o'clock, and 9 o'clock positions around the left nipple. The patient was then sent to the operating room where the sentinel node(s) were identified and removed by the surgeon. RADIOPHARMACEUTICALS:  Total of 1 mCi Millipore-filtered Technetium-64msulfur colloid, injected in four aliquots of 0.25 mCi each. IMPRESSION: Uncomplicated intradermal injection of a total of 1 mCi Technetium-967mulfur colloid for purposes of sentinel node identification. Electronically Signed   By: Th[place]insdale On: 04/30/2017 11:19   Mm Breast Surgical Specimen  Result Date: 04/30/2017 CLINICAL DATA:  Status post left lumpectomy. EXAM: SPECIMEN RADIOGRAPH OF THE LEFT BREAST COMPARISON:  Previous exam(s). FINDINGS: Status post excision of the left breast. The wire tip, biopsy marker clip, and mass are present and are marked for pathology. IMPRESSION: Specimen radiograph of the left breast. Electronically Signed   By: WeAbelardo Diesel.D.   On: 04/30/2017 14:57   Mm Lt Plc Breast Loc Dev   1st Lesion  Inc Mammo  Guide  Result Date: 04/30/2017 CLINICAL DATA:  Recent diagnosis of left breast cancer for surgical excision EXAM: NEEDLE LOCALIZATION OF THE LEFT BREAST WITH MAMMO GUIDANCE COMPARISON:  Previous exams. FINDINGS: Patient presents for needle localization prior to left breast surgery. I met with the patient and we discussed the procedure of needle localization including benefits and alternatives. We discussed the high likelihood of a successful procedure. We discussed the risks of the procedure, including infection, bleeding, tissue injury, and further surgery. Informed, written consent was given. The usual time-out protocol was performed immediately prior to the procedure. Using mammographic guidance, sterile technique, 1% lidocaine and a 9 cm modified Kopans needle, ribbon biopsy clip localized using cranial approach. The images were marked for Dr. Hassell Done. IMPRESSION: Needle localization left breast. No apparent complications. Electronically Signed   By: Abelardo Diesel M.D.   On: 04/30/2017 10:05    ASSESSMENT: Pathologic stage Ia ER positive, PR/HER-2 negative invasive carcinoma of the upper outer quadrant of the left breast.  PLAN:    1.  Pathologic stage Ia ER positive, PR/HER-2 negative invasive carcinoma of the upper outer quadrant of the left breast: Given the small size of patient's tumor and her advanced age, it was decided upon that chemotherapy is not necessary and therefore Oncotype DX was not ordered.  Patient consultation with radiation oncology today and will start XRT in the next several weeks.  Given the ER positivity of her tumor, she will benefit from an aromatase inhibitor for 5 years.  Return to clinic in 2 months near the end of her XRT for further evaluation and initiation of letrozole.   Approximately 30 minutes was spent in discussion of which greater than 50% was consultation.  Patient expressed understanding and was in agreement with this plan. She also understands that She can  call clinic at any time with any questions, concerns, or complaints.   Cancer Staging Primary cancer of upper outer quadrant of left female breast [facility]) Staging form: Breast, AJCC 8th Edition - Clinical stage from 04/17/2017: Stage IA (cT1a, cN0, cM0, G2, ER: Positive, PR: Negative, HER2: Negative) - Signed by Lloyd Huger, MD on 04/17/2017 - Pathologic stage from 05/11/2017: Stage IA (pT1b, pN0, cM0, G2, ER: Positive, PR: Negative, HER2: Negative) - Signed by Lloyd Huger, MD on 05/11/2017   Lloyd Huger, MD   05/11/2017 9:07 AM

## 2017-05-10 ENCOUNTER — Other Ambulatory Visit: Payer: Self-pay

## 2017-05-10 ENCOUNTER — Ambulatory Visit
Admission: RE | Admit: 2017-05-10 | Discharge: 2017-05-10 | Disposition: A | Discharge status: Home / Self Care | Payer: PPO | Source: Ambulatory Visit | Attending: Radiation Oncology | Admitting: Radiation Oncology

## 2017-05-10 ENCOUNTER — Encounter: Payer: Self-pay | Admitting: Radiation Oncology

## 2017-05-10 ENCOUNTER — Inpatient Hospital Stay: Payer: PPO | Attending: Oncology | Admitting: Oncology

## 2017-05-10 VITALS — BP 163/82 | HR 76 | Temp 97.5°F | Resp 20 | Wt 206.3 lb

## 2017-05-10 VITALS — BP 163/82 | HR 76 | Temp 97.5°F | Resp 20 | Ht 59.0 in | Wt 206.4 lb

## 2017-05-10 DIAGNOSIS — K649 Unspecified hemorrhoids: Secondary | ICD-10-CM | POA: Insufficient documentation

## 2017-05-10 DIAGNOSIS — E119 Type 2 diabetes mellitus without complications: Secondary | ICD-10-CM | POA: Insufficient documentation

## 2017-05-10 DIAGNOSIS — Z87891 Personal history of nicotine dependence: Secondary | ICD-10-CM | POA: Insufficient documentation

## 2017-05-10 DIAGNOSIS — E039 Hypothyroidism, unspecified: Secondary | ICD-10-CM | POA: Insufficient documentation

## 2017-05-10 DIAGNOSIS — Z85828 Personal history of other malignant neoplasm of skin: Secondary | ICD-10-CM | POA: Insufficient documentation

## 2017-05-10 DIAGNOSIS — R609 Edema, unspecified: Secondary | ICD-10-CM | POA: Insufficient documentation

## 2017-05-10 DIAGNOSIS — E785 Hyperlipidemia, unspecified: Secondary | ICD-10-CM

## 2017-05-10 DIAGNOSIS — K219 Gastro-esophageal reflux disease without esophagitis: Secondary | ICD-10-CM | POA: Insufficient documentation

## 2017-05-10 DIAGNOSIS — R0609 Other forms of dyspnea: Secondary | ICD-10-CM | POA: Diagnosis not present

## 2017-05-10 DIAGNOSIS — G4730 Sleep apnea, unspecified: Secondary | ICD-10-CM | POA: Diagnosis not present

## 2017-05-10 DIAGNOSIS — Z7982 Long term (current) use of aspirin: Secondary | ICD-10-CM | POA: Diagnosis not present

## 2017-05-10 DIAGNOSIS — Z170 Estrogen receptor positive status [ER+]: Secondary | ICD-10-CM | POA: Insufficient documentation

## 2017-05-10 DIAGNOSIS — J449 Chronic obstructive pulmonary disease, unspecified: Secondary | ICD-10-CM | POA: Diagnosis not present

## 2017-05-10 DIAGNOSIS — C50412 Malignant neoplasm of upper-outer quadrant of left female breast: Secondary | ICD-10-CM | POA: Insufficient documentation

## 2017-05-10 DIAGNOSIS — Z87442 Personal history of urinary calculi: Secondary | ICD-10-CM | POA: Diagnosis not present

## 2017-05-10 DIAGNOSIS — M48061 Spinal stenosis, lumbar region without neurogenic claudication: Secondary | ICD-10-CM | POA: Insufficient documentation

## 2017-05-10 DIAGNOSIS — R0602 Shortness of breath: Secondary | ICD-10-CM | POA: Diagnosis not present

## 2017-05-10 DIAGNOSIS — I10 Essential (primary) hypertension: Secondary | ICD-10-CM | POA: Insufficient documentation

## 2017-05-10 DIAGNOSIS — Z510 Encounter for antineoplastic radiation therapy: Secondary | ICD-10-CM | POA: Insufficient documentation

## 2017-05-10 DIAGNOSIS — Z7984 Long term (current) use of oral hypoglycemic drugs: Secondary | ICD-10-CM | POA: Insufficient documentation

## 2017-05-10 DIAGNOSIS — R011 Cardiac murmur, unspecified: Secondary | ICD-10-CM | POA: Diagnosis not present

## 2017-05-10 DIAGNOSIS — Z79899 Other long term (current) drug therapy: Secondary | ICD-10-CM | POA: Diagnosis not present

## 2017-05-10 NOTE — Consult Note (Signed)
NEW PATIENT EVALUATION  Name: Kelsey Young  MRN: [id number]  Date:   05/10/2017     DOB: 07/10/1931   This 83 y.o. female patient presents to the clinic for initial evaluation of stage I invasive mammary carcinoma of the left breast status post wide local excision ER positive PR negative HER-2/neu not overexpressed.  REFERRING PHYSICIAN: Perrin Maltese, MD  CHIEF COMPLAINT:  Chief Complaint  Patient presents with  . Breast Cancer    Pt is here for initial consultation of breast cancer.      DIAGNOSIS: The encounter diagnosis was Primary cancer of upper outer quadrant of left female breast ([place]).   PREVIOUS INVESTIGATIONS:  Mammogram and ultrasound reviewed Pathology report reviewed Clinical notes reviewed  HPI: Patient is a 83 year old female who presented with an abnormal mammogram of her left breast showing suspicious area of shadowing in the left breast 1:00 position on ultrasound which was initially demonstrated on mammography. Axillary lymph nodes were normal. She underwent ultrasound-guided core biopsy which was positive for invasive mammary carcinoma ER positive PR negative HER-2/neu not overexpressed. She would not have a wide local excision and sentinel node biopsy. Tumor was 6 mm. 3 sentinel lymph nodes were examined negative for metastatic disease. Margins were clear but close at 0.1 mm to the medial margin. Tumor was overall grade 2. She has done well postoperatively is still somewhat sore from her surgery. She is seen today by radiation oncology for consideration of adjuvant treatment.  PLANNED TREATMENT REGIMEN: Left hypofractionated whole breast radiation  PAST MEDICAL HISTORY:  has a past medical history of Cancer ([place]) (67/1245), Complication of anesthesia, COPD (chronic obstructive pulmonary disease) ([place]), Diabetes mellitus without complication ([place]), Dyspnea, Edema leg (03/2017), Family history of adverse reaction to anesthesia, GERD (gastroesophageal reflux  disease), Heart murmur, Hemorrhoids (03/2017), History of kidney stones, Hyperlipidemia, Hypertension, Hypothyroidism, Lumbar stenosis, Skin cancer (2016,2017,2018), Sleep apnea, and Thyroid disease.    PAST SURGICAL HISTORY:  Past Surgical History:  Procedure Laterality Date  . ABDOMINAL HYSTERECTOMY  1982  . APPENDECTOMY  1940  . BREAST EXCISIONAL BIOPSY Left 04/30/2017   left breast lumpectomy +  . CHOLECYSTECTOMY    . EYE SURGERY Bilateral 2003   cataract extraction  . implants  2003   dental, not removable  . JOINT REPLACEMENT Bilateral [id number]   total hip replacements  . JOINT REPLACEMENT Right 2014   right total knee replacement  . SKIN CANCER EXCISION     left hand and right lower leg  . THYROIDECTOMY, PARTIAL  2003  . TONSILLECTOMY      FAMILY HISTORY: family history includes Cancer in her maternal uncle; Diabetes in her brother, maternal aunt, maternal aunt, paternal aunt, and paternal aunt; Heart attack in her father and maternal aunt; Heart disease in her brother, paternal aunt, paternal aunt, and paternal grandmother; Heart failure in her mother; Melanoma in her brother.  SOCIAL HISTORY:  reports that she quit smoking about 31 years ago. Her smoking use included cigarettes. She smoked 1.50 packs per day. she has never used smokeless tobacco. She reports that she drinks about 0.6 - 1.2 oz of alcohol per week. She reports that she does not use drugs.  ALLERGIES: Contrast media [iodinated diagnostic agents]; Sulfur; Codeine; Morphine; Oxycodone; Penicillins; and Sulfa antibiotics  MEDICATIONS:  Current Outpatient Medications  Medication Sig Dispense Refill  . albuterol (PROVENTIL HFA;VENTOLIN HFA) 108 (90 Base) MCG/ACT inhaler Inhale 2 puffs into the lungs every 4 (four) hours as needed for wheezing or shortness  of breath. 1 Inhaler 10  . aspirin EC 81 MG EC tablet Take 1 tablet (81 mg total) by mouth daily. 30 tablet 3  . Coenzyme Q10 (CO Q 10 PO) Take 100 mg by mouth  daily.     Marland Kitchen diltiazem (CARDIZEM CD) 300 MG 24 hr capsule Take 1 capsule (300 mg total) by mouth daily. (Patient taking differently: Take 300 mg by mouth at bedtime. ) 7 capsule 0  . ipratropium-albuterol (DUONEB) 0.5-2.5 (3) MG/3ML SOLN Take 3 mLs by nebulization every 4 (four) hours. 360 mL 12  . levothyroxine (SYNTHROID) 50 MCG tablet Take 1 tablet (50 mcg total) by mouth daily. 30 tablet 6  . losartan (COZAAR) 50 MG tablet Take 50 mg by mouth at bedtime.     . metFORMIN (GLUCOPHAGE) 500 MG tablet Take 500 mg by mouth 2 (two) times daily with a meal.     . metoprolol succinate (TOPROL-XL) 50 MG 24 hr tablet Take 50 mg by mouth at bedtime. Take with or immediately following a meal.    . Multiple Vitamins-Minerals (PRESERVISION AREDS 2) CAPS Take 1 capsule by mouth 2 (two) times daily.     . Naphazoline HCl (CLEAR EYES OP) Place 2 drops into both eyes as needed (for dry eyes).    . naproxen sodium (ANAPROX) 220 MG tablet Take 220-440 mg by mouth 2 (two) times daily as needed (for pain).    Marland Kitchen omeprazole (PRILOSEC) 20 MG capsule Take 20 mg by mouth daily as needed (for heartburn).     . pravastatin (PRAVACHOL) 40 MG tablet     . simvastatin (ZOCOR) 20 MG tablet Take 20 mg by mouth at bedtime.    . triamterene-hydrochlorothiazide (MAXZIDE-25) 37.5-25 MG per tablet Take 0.5 tablets by mouth daily.    . Fluticasone-Salmeterol (ADVAIR DISKUS) 250-50 MCG/DOSE AEPB Inhale 1 puff into the lungs 2 (two) times daily. 60 each 1  . HYDROcodone-acetaminophen (NORCO/VICODIN) 5-325 MG tablet Take 1 tablet by mouth every 4 (four) hours as needed for moderate pain or severe pain. 40 tablet 0  . isosorbide mononitrate (IMDUR) 30 MG 24 hr tablet Take 1 tablet (30 mg total) by mouth daily. 30 tablet 3   No current facility-administered medications for this encounter.     ECOG PERFORMANCE STATUS:  0 - Asymptomatic  REVIEW OF SYSTEMS: Except for the breast tenderness Patient denies any weight loss, fatigue,  weakness, fever, chills or night sweats. Patient denies any loss of vision, blurred vision. Patient denies any ringing  of the ears or hearing loss. No irregular heartbeat. Patient denies heart murmur or history of fainting. Patient denies any chest pain or pain radiating to her upper extremities. Patient denies any shortness of breath, difficulty breathing at night, cough or hemoptysis. Patient denies any swelling in the lower legs. Patient denies any nausea vomiting, vomiting of blood, or coffee ground material in the vomitus. Patient denies any stomach pain. Patient states has had normal bowel movements no significant constipation or diarrhea. Patient denies any dysuria, hematuria or significant nocturia. Patient denies any problems walking, swelling in the joints or loss of balance. Patient denies any skin changes, loss of hair or loss of weight. Patient denies any excessive worrying or anxiety or significant depression. Patient denies any problems with insomnia. Patient denies excessive thirst, polyuria, polydipsia. Patient denies any swollen glands, patient denies easy bruising or easy bleeding. Patient denies any recent infections, allergies or URI. Patient "s visual fields have not changed significantly in recent time.  PHYSICAL EXAM: BP (!) 163/82   Pulse 76   Temp (!) 97.5 F (36.4 C)   Resp 20   Ht _0  (1.499 m)   Wt 206 lb 5.6 oz (93.6 kg)   BMI 41.68 kg/m  Patient is a wide local excision and sentinel node scar both well-healed. She's had sacrifice of the left nipple areolar complex. No dominant mass or nodularity is noted in either breast in 2 positions examined. No axillary or supraclavicular adenopathy is identified. Well-developed well-nourished patient in NAD. HEENT reveals PERLA, EOMI, discs not visualized.  Oral cavity is clear. No oral mucosal lesions are identified. Neck is clear without evidence of cervical or supraclavicular adenopathy. Lungs are clear to A&P. Cardiac  examination is essentially unremarkable with regular rate and rhythm without murmur rub or thrill. Abdomen is benign with no organomegaly or masses noted. Motor sensory and DTR levels are equal and symmetric in the upper and lower extremities. Cranial nerves II through XII are grossly intact. Proprioception is intact. No peripheral adenopathy or edema is identified. No motor or sensory levels are noted. Crude visual fields are within normal range.  LABORATORY DATA: Pathology reports reviewed    RADIOLOGY RESULTS: Mammogram and ultrasound reviewed   IMPRESSION: Stage I (T1 b N0 M0) ER positive PR negative HER-2/neu not overexpressed invasive mammary carcinoma the left breast status post wide local excision with nipple areolar complex sacrifice and sentinel node biopsy in 83 year old female  PLAN: At this time I recommended hypofractionated course of radiation treatment to her left breast over 4 weeks. Based on the close 1 mm margin would offer also scar boost 1600 cGy in 8 fractions. Risks and benefits of treatment including skin reaction fatigue alteration of blood counts possible inclusion of superficial lung all were discussed in detail with the patient. She seems to comprehend my treatment plan well. I personally set up and ordered CT simulation for next week. Based on its small tumor size her age would not be candidate for systemic chemotherapy. Patient will be a candidate for antiestrogen therapy after completion of radiation.  I would like to take this opportunity to thank you for allowing me to participate in the care of your patient.Armstead Peaks., MD

## 2017-05-17 ENCOUNTER — Ambulatory Visit
Admission: RE | Admit: 2017-05-17 | Discharge: 2017-05-17 | Disposition: A | Discharge status: Home / Self Care | Payer: PPO | Source: Ambulatory Visit | Attending: Radiation Oncology | Admitting: Radiation Oncology

## 2017-05-17 DIAGNOSIS — C50412 Malignant neoplasm of upper-outer quadrant of left female breast: Secondary | ICD-10-CM | POA: Diagnosis not present

## 2017-05-17 DIAGNOSIS — Z170 Estrogen receptor positive status [ER+]: Secondary | ICD-10-CM | POA: Diagnosis not present

## 2017-05-17 DIAGNOSIS — Z510 Encounter for antineoplastic radiation therapy: Secondary | ICD-10-CM | POA: Diagnosis not present

## 2017-05-19 ENCOUNTER — Other Ambulatory Visit: Payer: Self-pay | Admitting: *Deleted

## 2017-05-19 DIAGNOSIS — C50412 Malignant neoplasm of upper-outer quadrant of left female breast: Secondary | ICD-10-CM

## 2017-05-25 DIAGNOSIS — C50412 Malignant neoplasm of upper-outer quadrant of left female breast: Secondary | ICD-10-CM | POA: Diagnosis not present

## 2017-05-25 DIAGNOSIS — Z170 Estrogen receptor positive status [ER+]: Secondary | ICD-10-CM | POA: Diagnosis not present

## 2017-05-25 DIAGNOSIS — Z510 Encounter for antineoplastic radiation therapy: Secondary | ICD-10-CM | POA: Diagnosis not present

## 2017-05-27 ENCOUNTER — Ambulatory Visit
Admission: RE | Admit: 2017-05-27 | Discharge: 2017-05-27 | Disposition: A | Discharge status: Home / Self Care | Payer: PPO | Source: Ambulatory Visit | Attending: Radiation Oncology | Admitting: Radiation Oncology

## 2017-05-27 DIAGNOSIS — Z96659 Presence of unspecified artificial knee joint: Secondary | ICD-10-CM | POA: Diagnosis not present

## 2017-05-27 DIAGNOSIS — Z510 Encounter for antineoplastic radiation therapy: Secondary | ICD-10-CM | POA: Diagnosis not present

## 2017-05-27 DIAGNOSIS — C50412 Malignant neoplasm of upper-outer quadrant of left female breast: Secondary | ICD-10-CM | POA: Diagnosis not present

## 2017-05-27 DIAGNOSIS — J449 Chronic obstructive pulmonary disease, unspecified: Secondary | ICD-10-CM | POA: Diagnosis not present

## 2017-05-27 DIAGNOSIS — Z170 Estrogen receptor positive status [ER+]: Secondary | ICD-10-CM | POA: Diagnosis not present

## 2017-05-31 ENCOUNTER — Ambulatory Visit
Admission: RE | Admit: 2017-05-31 | Discharge: 2017-05-31 | Disposition: A | Discharge status: Home / Self Care | Payer: PPO | Source: Ambulatory Visit | Attending: Radiation Oncology | Admitting: Radiation Oncology

## 2017-05-31 DIAGNOSIS — Z170 Estrogen receptor positive status [ER+]: Secondary | ICD-10-CM | POA: Diagnosis not present

## 2017-05-31 DIAGNOSIS — C50412 Malignant neoplasm of upper-outer quadrant of left female breast: Secondary | ICD-10-CM | POA: Diagnosis not present

## 2017-05-31 DIAGNOSIS — Z510 Encounter for antineoplastic radiation therapy: Secondary | ICD-10-CM | POA: Diagnosis not present

## 2017-06-01 ENCOUNTER — Ambulatory Visit
Admission: RE | Admit: 2017-06-01 | Discharge: 2017-06-01 | Disposition: A | Discharge status: Home / Self Care | Payer: PPO | Source: Ambulatory Visit | Attending: Radiation Oncology | Admitting: Radiation Oncology

## 2017-06-01 DIAGNOSIS — Z510 Encounter for antineoplastic radiation therapy: Secondary | ICD-10-CM | POA: Diagnosis not present

## 2017-06-01 DIAGNOSIS — Z170 Estrogen receptor positive status [ER+]: Secondary | ICD-10-CM | POA: Diagnosis not present

## 2017-06-01 DIAGNOSIS — C50412 Malignant neoplasm of upper-outer quadrant of left female breast: Secondary | ICD-10-CM | POA: Diagnosis not present

## 2017-06-02 ENCOUNTER — Ambulatory Visit
Admission: RE | Admit: 2017-06-02 | Discharge: 2017-06-02 | Disposition: A | Discharge status: Home / Self Care | Payer: PPO | Source: Ambulatory Visit | Attending: Radiation Oncology | Admitting: Radiation Oncology

## 2017-06-02 DIAGNOSIS — Z510 Encounter for antineoplastic radiation therapy: Secondary | ICD-10-CM | POA: Diagnosis not present

## 2017-06-02 DIAGNOSIS — C50412 Malignant neoplasm of upper-outer quadrant of left female breast: Secondary | ICD-10-CM | POA: Diagnosis not present

## 2017-06-02 DIAGNOSIS — Z170 Estrogen receptor positive status [ER+]: Secondary | ICD-10-CM | POA: Diagnosis not present

## 2017-06-03 ENCOUNTER — Ambulatory Visit
Admission: RE | Admit: 2017-06-03 | Discharge: 2017-06-03 | Disposition: A | Discharge status: Home / Self Care | Payer: PPO | Source: Ambulatory Visit | Attending: Radiation Oncology | Admitting: Radiation Oncology

## 2017-06-03 DIAGNOSIS — Z170 Estrogen receptor positive status [ER+]: Secondary | ICD-10-CM | POA: Diagnosis not present

## 2017-06-03 DIAGNOSIS — G4733 Obstructive sleep apnea (adult) (pediatric): Secondary | ICD-10-CM | POA: Diagnosis not present

## 2017-06-03 DIAGNOSIS — I10 Essential (primary) hypertension: Secondary | ICD-10-CM | POA: Diagnosis not present

## 2017-06-03 DIAGNOSIS — E7800 Pure hypercholesterolemia, unspecified: Secondary | ICD-10-CM | POA: Diagnosis not present

## 2017-06-03 DIAGNOSIS — C50412 Malignant neoplasm of upper-outer quadrant of left female breast: Secondary | ICD-10-CM | POA: Diagnosis not present

## 2017-06-03 DIAGNOSIS — Z510 Encounter for antineoplastic radiation therapy: Secondary | ICD-10-CM | POA: Diagnosis not present

## 2017-06-03 DIAGNOSIS — K219 Gastro-esophageal reflux disease without esophagitis: Secondary | ICD-10-CM | POA: Diagnosis not present

## 2017-06-03 DIAGNOSIS — E782 Mixed hyperlipidemia: Secondary | ICD-10-CM | POA: Diagnosis not present

## 2017-06-04 ENCOUNTER — Ambulatory Visit
Admission: RE | Admit: 2017-06-04 | Discharge: 2017-06-04 | Disposition: A | Discharge status: Home / Self Care | Payer: PPO | Source: Ambulatory Visit | Attending: Radiation Oncology | Admitting: Radiation Oncology

## 2017-06-04 DIAGNOSIS — C50412 Malignant neoplasm of upper-outer quadrant of left female breast: Secondary | ICD-10-CM | POA: Diagnosis not present

## 2017-06-04 DIAGNOSIS — Z510 Encounter for antineoplastic radiation therapy: Secondary | ICD-10-CM | POA: Diagnosis not present

## 2017-06-04 DIAGNOSIS — Z170 Estrogen receptor positive status [ER+]: Secondary | ICD-10-CM | POA: Diagnosis not present

## 2017-06-07 ENCOUNTER — Ambulatory Visit: Payer: PPO

## 2017-06-08 ENCOUNTER — Ambulatory Visit
Admission: RE | Admit: 2017-06-08 | Discharge: 2017-06-08 | Disposition: A | Discharge status: Home / Self Care | Payer: PPO | Source: Ambulatory Visit | Attending: Radiation Oncology | Admitting: Radiation Oncology

## 2017-06-08 DIAGNOSIS — Z510 Encounter for antineoplastic radiation therapy: Secondary | ICD-10-CM | POA: Diagnosis not present

## 2017-06-08 DIAGNOSIS — C50412 Malignant neoplasm of upper-outer quadrant of left female breast: Secondary | ICD-10-CM | POA: Diagnosis not present

## 2017-06-08 DIAGNOSIS — Z170 Estrogen receptor positive status [ER+]: Secondary | ICD-10-CM | POA: Diagnosis not present

## 2017-06-09 ENCOUNTER — Ambulatory Visit: Admission: RE | Admit: 2017-06-09 | Payer: PPO | Source: Ambulatory Visit

## 2017-06-09 ENCOUNTER — Ambulatory Visit: Payer: PPO

## 2017-06-10 ENCOUNTER — Ambulatory Visit
Admission: RE | Admit: 2017-06-10 | Discharge: 2017-06-10 | Disposition: A | Discharge status: Home / Self Care | Payer: PPO | Source: Ambulatory Visit | Attending: Radiation Oncology | Admitting: Radiation Oncology

## 2017-06-10 DIAGNOSIS — I10 Essential (primary) hypertension: Secondary | ICD-10-CM | POA: Diagnosis not present

## 2017-06-10 DIAGNOSIS — Z170 Estrogen receptor positive status [ER+]: Secondary | ICD-10-CM | POA: Diagnosis not present

## 2017-06-10 DIAGNOSIS — F411 Generalized anxiety disorder: Secondary | ICD-10-CM | POA: Diagnosis not present

## 2017-06-10 DIAGNOSIS — L0390 Cellulitis, unspecified: Secondary | ICD-10-CM | POA: Diagnosis not present

## 2017-06-10 DIAGNOSIS — R300 Dysuria: Secondary | ICD-10-CM | POA: Diagnosis not present

## 2017-06-10 DIAGNOSIS — E119 Type 2 diabetes mellitus without complications: Secondary | ICD-10-CM | POA: Diagnosis not present

## 2017-06-10 DIAGNOSIS — E782 Mixed hyperlipidemia: Secondary | ICD-10-CM | POA: Diagnosis not present

## 2017-06-10 DIAGNOSIS — Z510 Encounter for antineoplastic radiation therapy: Secondary | ICD-10-CM | POA: Diagnosis not present

## 2017-06-10 DIAGNOSIS — C50412 Malignant neoplasm of upper-outer quadrant of left female breast: Secondary | ICD-10-CM | POA: Diagnosis not present

## 2017-06-11 ENCOUNTER — Ambulatory Visit
Admission: RE | Admit: 2017-06-11 | Discharge: 2017-06-11 | Disposition: A | Discharge status: Home / Self Care | Payer: PPO | Source: Ambulatory Visit | Attending: Radiation Oncology | Admitting: Radiation Oncology

## 2017-06-11 DIAGNOSIS — C50412 Malignant neoplasm of upper-outer quadrant of left female breast: Secondary | ICD-10-CM | POA: Diagnosis not present

## 2017-06-11 DIAGNOSIS — Z170 Estrogen receptor positive status [ER+]: Secondary | ICD-10-CM | POA: Diagnosis not present

## 2017-06-11 DIAGNOSIS — Z510 Encounter for antineoplastic radiation therapy: Secondary | ICD-10-CM | POA: Diagnosis not present

## 2017-06-14 ENCOUNTER — Inpatient Hospital Stay: Payer: PPO | Attending: Radiation Oncology

## 2017-06-14 ENCOUNTER — Ambulatory Visit
Admission: RE | Admit: 2017-06-14 | Discharge: 2017-06-14 | Disposition: A | Discharge status: Home / Self Care | Payer: PPO | Source: Ambulatory Visit | Attending: Radiation Oncology | Admitting: Radiation Oncology

## 2017-06-14 DIAGNOSIS — Z510 Encounter for antineoplastic radiation therapy: Secondary | ICD-10-CM | POA: Diagnosis not present

## 2017-06-14 DIAGNOSIS — C50412 Malignant neoplasm of upper-outer quadrant of left female breast: Secondary | ICD-10-CM | POA: Diagnosis not present

## 2017-06-14 DIAGNOSIS — Z170 Estrogen receptor positive status [ER+]: Secondary | ICD-10-CM | POA: Diagnosis not present

## 2017-06-15 ENCOUNTER — Ambulatory Visit
Admission: RE | Admit: 2017-06-15 | Discharge: 2017-06-15 | Disposition: A | Discharge status: Home / Self Care | Payer: PPO | Source: Ambulatory Visit | Attending: Radiation Oncology | Admitting: Radiation Oncology

## 2017-06-15 DIAGNOSIS — C50412 Malignant neoplasm of upper-outer quadrant of left female breast: Secondary | ICD-10-CM | POA: Diagnosis not present

## 2017-06-15 DIAGNOSIS — Z510 Encounter for antineoplastic radiation therapy: Secondary | ICD-10-CM | POA: Diagnosis not present

## 2017-06-15 DIAGNOSIS — Z170 Estrogen receptor positive status [ER+]: Secondary | ICD-10-CM | POA: Diagnosis not present

## 2017-06-16 ENCOUNTER — Ambulatory Visit
Admission: RE | Admit: 2017-06-16 | Discharge: 2017-06-16 | Disposition: A | Discharge status: Home / Self Care | Payer: PPO | Source: Ambulatory Visit | Attending: Radiation Oncology | Admitting: Radiation Oncology

## 2017-06-16 DIAGNOSIS — Z170 Estrogen receptor positive status [ER+]: Secondary | ICD-10-CM | POA: Diagnosis not present

## 2017-06-16 DIAGNOSIS — Z510 Encounter for antineoplastic radiation therapy: Secondary | ICD-10-CM | POA: Diagnosis not present

## 2017-06-16 DIAGNOSIS — C50412 Malignant neoplasm of upper-outer quadrant of left female breast: Secondary | ICD-10-CM | POA: Diagnosis not present

## 2017-06-17 ENCOUNTER — Ambulatory Visit
Admission: RE | Admit: 2017-06-17 | Discharge: 2017-06-17 | Disposition: A | Discharge status: Home / Self Care | Payer: PPO | Source: Ambulatory Visit | Attending: Radiation Oncology | Admitting: Radiation Oncology

## 2017-06-17 DIAGNOSIS — C50412 Malignant neoplasm of upper-outer quadrant of left female breast: Secondary | ICD-10-CM | POA: Diagnosis not present

## 2017-06-17 DIAGNOSIS — Z510 Encounter for antineoplastic radiation therapy: Secondary | ICD-10-CM | POA: Diagnosis not present

## 2017-06-17 DIAGNOSIS — Z170 Estrogen receptor positive status [ER+]: Secondary | ICD-10-CM | POA: Diagnosis not present

## 2017-06-18 ENCOUNTER — Ambulatory Visit
Admission: RE | Admit: 2017-06-18 | Discharge: 2017-06-18 | Disposition: A | Discharge status: Home / Self Care | Payer: PPO | Source: Ambulatory Visit | Attending: Radiation Oncology | Admitting: Radiation Oncology

## 2017-06-18 DIAGNOSIS — Z170 Estrogen receptor positive status [ER+]: Secondary | ICD-10-CM | POA: Diagnosis not present

## 2017-06-18 DIAGNOSIS — C50412 Malignant neoplasm of upper-outer quadrant of left female breast: Secondary | ICD-10-CM | POA: Diagnosis not present

## 2017-06-18 DIAGNOSIS — Z510 Encounter for antineoplastic radiation therapy: Secondary | ICD-10-CM | POA: Diagnosis not present

## 2017-06-21 ENCOUNTER — Ambulatory Visit
Admission: RE | Admit: 2017-06-21 | Discharge: 2017-06-21 | Disposition: A | Discharge status: Home / Self Care | Payer: PPO | Source: Ambulatory Visit | Attending: Radiation Oncology | Admitting: Radiation Oncology

## 2017-06-21 DIAGNOSIS — C50412 Malignant neoplasm of upper-outer quadrant of left female breast: Secondary | ICD-10-CM | POA: Diagnosis not present

## 2017-06-21 DIAGNOSIS — Z510 Encounter for antineoplastic radiation therapy: Secondary | ICD-10-CM | POA: Diagnosis not present

## 2017-06-21 DIAGNOSIS — Z170 Estrogen receptor positive status [ER+]: Secondary | ICD-10-CM | POA: Diagnosis not present

## 2017-06-23 ENCOUNTER — Ambulatory Visit
Admission: RE | Admit: 2017-06-23 | Discharge: 2017-06-23 | Disposition: A | Discharge status: Home / Self Care | Payer: PPO | Source: Ambulatory Visit | Attending: Radiation Oncology | Admitting: Radiation Oncology

## 2017-06-23 ENCOUNTER — Ambulatory Visit: Payer: PPO

## 2017-06-23 DIAGNOSIS — C50412 Malignant neoplasm of upper-outer quadrant of left female breast: Secondary | ICD-10-CM | POA: Diagnosis not present

## 2017-06-23 DIAGNOSIS — Z510 Encounter for antineoplastic radiation therapy: Secondary | ICD-10-CM | POA: Diagnosis not present

## 2017-06-23 DIAGNOSIS — Z170 Estrogen receptor positive status [ER+]: Secondary | ICD-10-CM | POA: Diagnosis not present

## 2017-06-24 ENCOUNTER — Ambulatory Visit: Payer: PPO

## 2017-06-24 ENCOUNTER — Ambulatory Visit
Admission: RE | Admit: 2017-06-24 | Discharge: 2017-06-24 | Disposition: A | Discharge status: Home / Self Care | Payer: PPO | Source: Ambulatory Visit | Attending: Radiation Oncology | Admitting: Radiation Oncology

## 2017-06-24 DIAGNOSIS — Z510 Encounter for antineoplastic radiation therapy: Secondary | ICD-10-CM | POA: Diagnosis not present

## 2017-06-24 DIAGNOSIS — Z170 Estrogen receptor positive status [ER+]: Secondary | ICD-10-CM | POA: Diagnosis not present

## 2017-06-24 DIAGNOSIS — C50412 Malignant neoplasm of upper-outer quadrant of left female breast: Secondary | ICD-10-CM | POA: Diagnosis not present

## 2017-06-25 ENCOUNTER — Ambulatory Visit
Admission: RE | Admit: 2017-06-25 | Discharge: 2017-06-25 | Disposition: A | Discharge status: Home / Self Care | Payer: PPO | Source: Ambulatory Visit | Attending: Radiation Oncology | Admitting: Radiation Oncology

## 2017-06-25 DIAGNOSIS — E119 Type 2 diabetes mellitus without complications: Secondary | ICD-10-CM | POA: Diagnosis not present

## 2017-06-25 DIAGNOSIS — I10 Essential (primary) hypertension: Secondary | ICD-10-CM | POA: Diagnosis not present

## 2017-06-25 DIAGNOSIS — Z510 Encounter for antineoplastic radiation therapy: Secondary | ICD-10-CM | POA: Diagnosis not present

## 2017-06-25 DIAGNOSIS — E039 Hypothyroidism, unspecified: Secondary | ICD-10-CM | POA: Diagnosis not present

## 2017-06-25 DIAGNOSIS — E782 Mixed hyperlipidemia: Secondary | ICD-10-CM | POA: Diagnosis not present

## 2017-06-26 DIAGNOSIS — Z96659 Presence of unspecified artificial knee joint: Secondary | ICD-10-CM | POA: Diagnosis not present

## 2017-06-26 DIAGNOSIS — J449 Chronic obstructive pulmonary disease, unspecified: Secondary | ICD-10-CM | POA: Diagnosis not present

## 2017-06-28 ENCOUNTER — Ambulatory Visit
Admission: RE | Admit: 2017-06-28 | Discharge: 2017-06-28 | Disposition: A | Discharge status: Home / Self Care | Payer: PPO | Source: Ambulatory Visit | Attending: Radiation Oncology | Admitting: Radiation Oncology

## 2017-06-28 ENCOUNTER — Inpatient Hospital Stay: Payer: PPO

## 2017-06-28 DIAGNOSIS — E782 Mixed hyperlipidemia: Secondary | ICD-10-CM | POA: Diagnosis not present

## 2017-06-28 DIAGNOSIS — E119 Type 2 diabetes mellitus without complications: Secondary | ICD-10-CM | POA: Diagnosis not present

## 2017-06-28 DIAGNOSIS — I10 Essential (primary) hypertension: Secondary | ICD-10-CM | POA: Diagnosis not present

## 2017-06-28 DIAGNOSIS — Z510 Encounter for antineoplastic radiation therapy: Secondary | ICD-10-CM | POA: Diagnosis not present

## 2017-06-30 ENCOUNTER — Ambulatory Visit
Admission: RE | Admit: 2017-06-30 | Discharge: 2017-06-30 | Disposition: A | Discharge status: Home / Self Care | Payer: PPO | Source: Ambulatory Visit | Attending: Radiation Oncology | Admitting: Radiation Oncology

## 2017-06-30 DIAGNOSIS — Z510 Encounter for antineoplastic radiation therapy: Secondary | ICD-10-CM | POA: Diagnosis not present

## 2017-07-01 ENCOUNTER — Ambulatory Visit
Admission: RE | Admit: 2017-07-01 | Discharge: 2017-07-01 | Disposition: A | Discharge status: Home / Self Care | Payer: PPO | Source: Ambulatory Visit | Attending: Radiation Oncology | Admitting: Radiation Oncology

## 2017-07-01 DIAGNOSIS — Z170 Estrogen receptor positive status [ER+]: Secondary | ICD-10-CM | POA: Diagnosis not present

## 2017-07-01 DIAGNOSIS — Z510 Encounter for antineoplastic radiation therapy: Secondary | ICD-10-CM | POA: Diagnosis not present

## 2017-07-01 DIAGNOSIS — C50412 Malignant neoplasm of upper-outer quadrant of left female breast: Secondary | ICD-10-CM | POA: Diagnosis not present

## 2017-07-02 ENCOUNTER — Ambulatory Visit
Admission: RE | Admit: 2017-07-02 | Discharge: 2017-07-02 | Disposition: A | Discharge status: Home / Self Care | Payer: PPO | Source: Ambulatory Visit | Attending: Radiation Oncology | Admitting: Radiation Oncology

## 2017-07-02 DIAGNOSIS — Z510 Encounter for antineoplastic radiation therapy: Secondary | ICD-10-CM | POA: Diagnosis not present

## 2017-07-04 ENCOUNTER — Ambulatory Visit: Payer: PPO

## 2017-07-05 ENCOUNTER — Ambulatory Visit
Admission: RE | Admit: 2017-07-05 | Discharge: 2017-07-05 | Disposition: A | Discharge status: Home / Self Care | Payer: PPO | Source: Ambulatory Visit | Attending: Radiation Oncology | Admitting: Radiation Oncology

## 2017-07-05 ENCOUNTER — Ambulatory Visit: Payer: PPO

## 2017-07-05 DIAGNOSIS — Z510 Encounter for antineoplastic radiation therapy: Secondary | ICD-10-CM | POA: Diagnosis not present

## 2017-07-06 ENCOUNTER — Ambulatory Visit
Admission: RE | Admit: 2017-07-06 | Discharge: 2017-07-06 | Disposition: A | Discharge status: Home / Self Care | Payer: PPO | Source: Ambulatory Visit | Attending: Radiation Oncology | Admitting: Radiation Oncology

## 2017-07-06 ENCOUNTER — Ambulatory Visit: Payer: PPO

## 2017-07-06 DIAGNOSIS — Z510 Encounter for antineoplastic radiation therapy: Secondary | ICD-10-CM | POA: Diagnosis not present

## 2017-07-07 ENCOUNTER — Ambulatory Visit
Admission: RE | Admit: 2017-07-07 | Discharge: 2017-07-07 | Disposition: A | Discharge status: Home / Self Care | Payer: PPO | Source: Ambulatory Visit | Attending: Radiation Oncology | Admitting: Radiation Oncology

## 2017-07-07 DIAGNOSIS — Z510 Encounter for antineoplastic radiation therapy: Secondary | ICD-10-CM | POA: Diagnosis not present

## 2017-07-07 DIAGNOSIS — Z170 Estrogen receptor positive status [ER+]: Secondary | ICD-10-CM | POA: Diagnosis not present

## 2017-07-07 DIAGNOSIS — C50412 Malignant neoplasm of upper-outer quadrant of left female breast: Secondary | ICD-10-CM | POA: Diagnosis not present

## 2017-07-11 NOTE — Progress Notes (Signed)
[place]  [phone] [phone]  ID: Kelsey Young OB: [birth year]  MR#: [id number]  TDD#:[id number]  Patient Care Team: Perrin Maltese, MD as PCP - General (Internal Medicine)  CHIEF COMPLAINT: Pathologic stage Ia ER positive, PR/HER-2 negative invasive carcinoma of the upper outer quadrant of the left breast.  INTERVAL HISTORY: Patient returns to clinic today for further evaluation and initiation of aromatase inhibitor.  She recently completed XRT with only some mild skin discomfort.  She currently feels well.  She has no neurologic complaints. She denies any recent fevers or illnesses. She has a good appetite and denies weight loss. She has no chest pain or shortness of breath. She denies any nausea, vomiting, constipation, or diarrhea. She has no urinary complaints. Patient offer no specific complaints today.  REVIEW OF SYSTEMS:   Review of Systems  Constitutional: Negative.  Negative for fever, malaise/fatigue and weight loss.  Respiratory: Negative.  Negative for cough and shortness of breath.   Cardiovascular: Negative.  Negative for chest pain and leg swelling.  Gastrointestinal: Negative.  Negative for abdominal pain.  Genitourinary: Negative.   Musculoskeletal: Negative.   Skin: Negative.  Negative for rash.  Neurological: Negative.  Negative for weakness.  Psychiatric/Behavioral: Negative.  The patient is not nervous/anxious.     As per HPI. Otherwise, a complete review of systems is negative.  PAST MEDICAL HISTORY: Past Medical History:  Diagnosis Date  . Cancer ([place]) 03/2017   left breast  . Complication of anesthesia    has been slow to wake up  . COPD (chronic obstructive pulmonary disease) ([place])   . Diabetes mellitus without complication ([place])   . Dyspnea   . Edema leg 03/2017   bilateral swelling  . Family history of adverse reaction to anesthesia    son had surgery and heart stopped during procedure  . GERD  (gastroesophageal reflux disease)   . Heart murmur    no treatment  . Hemorrhoids 03/2017  . History of kidney stones    had eswl  . Hyperlipidemia   . Hypertension   . Hypothyroidism   . Lumbar stenosis   . Skin cancer 2016,2017,2018   face, back of neck, shoulder, leg, hand  . Sleep apnea    does not and will not use a cpap  . Thyroid disease     PAST SURGICAL HISTORY: Past Surgical History:  Procedure Laterality Date  . ABDOMINAL HYSTERECTOMY  1982  . APPENDECTOMY  1940  . BREAST EXCISIONAL BIOPSY Left 04/30/2017   left breast lumpectomy +  . BREAST LUMPECTOMY WITH NEEDLE LOCALIZATION AND AXILLARY SENTINEL LYMPH NODE BX Left 04/30/2017   Procedure: LEFT BREAST LUMPECTOMY WITH NEEDLE LOCALIZATION AND LEFT AXILLARY SENTINEL LYMPH NODE BX;  Surgeon: Johnathan Hausen, MD;  Location: [facility] ORS;  Service: General;  Laterality: Left;  . CHOLECYSTECTOMY    . EYE SURGERY Bilateral 2003   cataract extraction  . implants  2003   dental, not removable  . JOINT REPLACEMENT Bilateral [id number]   total hip replacements  . JOINT REPLACEMENT Right 2014   right total knee replacement  . SKIN CANCER EXCISION     left hand and right lower leg  . THYROIDECTOMY, PARTIAL  2003  . TONSILLECTOMY      FAMILY HISTORY: Family History  Problem Relation Age of Onset  . Heart failure Mother   . Heart attack Father   . Diabetes Brother   . Heart disease Brother   . Melanoma Brother   .  Cancer Maternal Uncle   . Diabetes Paternal Aunt   . Heart disease Paternal Aunt   . Heart disease Paternal Grandmother   . Diabetes Maternal Aunt   . Heart attack Maternal Aunt   . Diabetes Maternal Aunt   . Diabetes Paternal Aunt   . Heart disease Paternal Aunt   . Breast cancer Neg Hx     ADVANCED DIRECTIVES (Y/N):  N  HEALTH MAINTENANCE: Social History   Tobacco Use  . Smoking status: Former Smoker    Packs/day: 1.50    Types: Cigarettes    Last attempt to quit: 06/29/1985    Years since  quitting: 32.0  . Smokeless tobacco: Never Used  Substance Use Topics  . Alcohol use: Yes    Alcohol/week: 0.6 - 1.2 oz    Types: 1 - 2 Standard drinks or equivalent per week    Comment: wine 3 times a week  . Drug use: No     Colonoscopy:  PAP:  Bone density:  Lipid panel:  Allergies  Allergen Reactions  . Contrast Media [Iodinated Diagnostic Agents] Hives, Shortness Of Breath and Other (See Comments)    Restless. Difficulty breathing. Topical betadine okay   . Sulfur Hives and Shortness Of Breath  . Codeine Nausea And Vomiting    With high doses, passes out  . Morphine Nausea And Vomiting    With high doses, passes out  . Oxycodone Nausea And Vomiting and Other (See Comments)    Dizzy and with high doses, passes out.  Marland Kitchen Penicillins Other (See Comments)    "Hardened lump" Has patient had a PCN reaction causing immediate rash, facial/tongue/throat swelling, SOB or lightheadedness with hypotension: No Has patient had a PCN reaction causing severe rash involving mucus membranes or skin necrosis: No Has patient had a PCN reaction that required hospitalization No Has patient had a PCN reaction occurring within the last 10 years: No If all of the above answers are "NO", then may proceed with Cephalosporin use.    . Sulfa Antibiotics Rash    Current Outpatient Medications  Medication Sig Dispense Refill  . albuterol (PROVENTIL HFA;VENTOLIN HFA) 108 (90 Base) MCG/ACT inhaler Inhale 2 puffs into the lungs every 4 (four) hours as needed for wheezing or shortness of breath. 1 Inhaler 10  . aspirin EC 81 MG EC tablet Take 1 tablet (81 mg total) by mouth daily. 30 tablet 3  . Coenzyme Q10 (CO Q 10 PO) Take 100 mg by mouth daily.     Marland Kitchen diltiazem (CARDIZEM CD) 300 MG 24 hr capsule Take 1 capsule (300 mg total) by mouth daily. (Patient taking differently: Take 300 mg by mouth at bedtime. ) 7 capsule 0  . Fluticasone-Salmeterol (ADVAIR DISKUS) 250-50 MCG/DOSE AEPB Inhale 1 puff into the  lungs 2 (two) times daily. 60 each 1  . HYDROcodone-acetaminophen (NORCO/VICODIN) 5-325 MG tablet Take 1 tablet by mouth every 4 (four) hours as needed for moderate pain or severe pain. 40 tablet 0  . ipratropium-albuterol (DUONEB) 0.5-2.5 (3) MG/3ML SOLN Take 3 mLs by nebulization every 4 (four) hours. 360 mL 12  . isosorbide mononitrate (IMDUR) 30 MG 24 hr tablet Take 1 tablet (30 mg total) by mouth daily. 30 tablet 3  . levothyroxine (SYNTHROID) 50 MCG tablet Take 1 tablet (50 mcg total) by mouth daily. 30 tablet 6  . losartan (COZAAR) 50 MG tablet Take 50 mg by mouth at bedtime.     . metFORMIN (GLUCOPHAGE) 500 MG tablet Take 500 mg by  mouth 2 (two) times daily with a meal.     . metoprolol succinate (TOPROL-XL) 50 MG 24 hr tablet Take 50 mg by mouth at bedtime. Take with or immediately following a meal.    . Multiple Vitamins-Minerals (PRESERVISION AREDS 2) CAPS Take 1 capsule by mouth 2 (two) times daily.     . Naphazoline HCl (CLEAR EYES OP) Place 2 drops into both eyes as needed (for dry eyes).    . naproxen sodium (ANAPROX) 220 MG tablet Take 220-440 mg by mouth 2 (two) times daily as needed (for pain).    Marland Kitchen omeprazole (PRILOSEC) 20 MG capsule Take 20 mg by mouth daily as needed (for heartburn).     . pravastatin (PRAVACHOL) 40 MG tablet     . simvastatin (ZOCOR) 20 MG tablet Take 20 mg by mouth at bedtime.    . triamterene-hydrochlorothiazide (MAXZIDE-25) 37.5-25 MG per tablet Take 0.5 tablets by mouth daily.    Marland Kitchen letrozole (FEMARA) 2.5 MG tablet Take 1 tablet (2.5 mg total) by mouth daily. 30 tablet 3   No current facility-administered medications for this visit.     OBJECTIVE: Vitals:   07/15/17 1155  BP: (!) 146/69  Pulse: 69  Resp: 18  Temp: 98.3 F (36.8 C)     Body mass index is 41.28 kg/m.    ECOG FS:0 - Asymptomatic  General: Well-developed, well-nourished, no acute distress. Eyes: Pink conjunctiva, anicteric sclera. Breasts: Mild erythema on left breast. Lungs:  Clear to auscultation bilaterally. Heart: Regular rate and rhythm. No rubs, murmurs, or gallops. Abdomen: Soft, nontender, nondistended. No organomegaly noted, normoactive bowel sounds. Musculoskeletal: No edema, cyanosis, or clubbing. Neuro: Alert, answering all questions appropriately. Cranial nerves grossly intact. Skin: No rashes or petechiae noted. Psych: Normal affect.   LAB RESULTS:  Lab Results  Component Value Date   NA 139 04/22/2017   K 4.2 04/22/2017   CL 101 04/22/2017   CO2 27 04/22/2017   GLUCOSE 105 (H) 04/22/2017   BUN 16 04/22/2017   CREATININE 0.79 04/22/2017   CALCIUM 10.3 04/22/2017   PROT 7.3 11/23/2016   ALBUMIN 4.0 11/23/2016   AST 26 11/23/2016   ALT 17 11/23/2016   ALKPHOS 82 11/23/2016   BILITOT 0.6 11/23/2016   GFRNONAA >60 04/22/2017   GFRAA >60 04/22/2017    Lab Results  Component Value Date   WBC 9.2 04/22/2017   NEUTROABS 4.9 12/04/2013   HGB 14.5 04/22/2017   HCT 44.2 04/22/2017   MCV 91.3 04/22/2017   PLT 310 04/22/2017     STUDIES: No results found.  ASSESSMENT: Pathologic stage Ia ER positive, PR/HER-2 negative invasive carcinoma of the upper outer quadrant of the left breast.  PLAN:    1.  Pathologic stage Ia ER positive, PR/HER-2 negative invasive carcinoma of the upper outer quadrant of the left breast: Given the small size of patient's tumor and her advanced age, it was decided upon that chemotherapy is not necessary and therefore Oncotype DX was not ordered.  Patient is now completed her XRT and was given a prescription for letrozole which she will take for a total of 5 years completing in January 2024.  We will get a baseline bone mineral density in the next 1-2 weeks.  Return to clinic in 3 months for routine evaluation.    Approximately 30 minutes was spent in discussion of which greater than 50% was consultation.  Patient expressed understanding and was in agreement with this plan. She also understands that She can call  clinic at any time with any questions, concerns, or complaints.   Cancer Staging Primary cancer of upper outer quadrant of left female breast [facility]) Staging form: Breast, AJCC 8th Edition - Clinical stage from 04/17/2017: Stage IA (cT1a, cN0, cM0, G2, ER: Positive, PR: Negative, HER2: Negative) - Signed by Lloyd Huger, MD on 04/17/2017 - Pathologic stage from 05/11/2017: Stage IA (pT1b, pN0, cM0, G2, ER: Positive, PR: Negative, HER2: Negative) - Signed by Lloyd Huger, MD on 05/11/2017   Lloyd Huger, MD   07/18/2017 11:58 AM

## 2017-07-14 DIAGNOSIS — L821 Other seborrheic keratosis: Secondary | ICD-10-CM | POA: Diagnosis not present

## 2017-07-14 DIAGNOSIS — D1801 Hemangioma of skin and subcutaneous tissue: Secondary | ICD-10-CM | POA: Diagnosis not present

## 2017-07-15 ENCOUNTER — Inpatient Hospital Stay: Payer: PPO | Attending: Oncology | Admitting: Oncology

## 2017-07-15 VITALS — BP 146/69 | HR 69 | Temp 98.3°F | Resp 18 | Wt 204.4 lb

## 2017-07-15 DIAGNOSIS — Z170 Estrogen receptor positive status [ER+]: Secondary | ICD-10-CM | POA: Diagnosis not present

## 2017-07-15 DIAGNOSIS — Z923 Personal history of irradiation: Secondary | ICD-10-CM | POA: Diagnosis not present

## 2017-07-15 DIAGNOSIS — C50412 Malignant neoplasm of upper-outer quadrant of left female breast: Secondary | ICD-10-CM | POA: Insufficient documentation

## 2017-07-15 DIAGNOSIS — Z79811 Long term (current) use of aromatase inhibitors: Secondary | ICD-10-CM | POA: Insufficient documentation

## 2017-07-15 MED ORDER — LETROZOLE 2.5 MG PO TABS: 2.5000 mg | ORAL_TABLET | Freq: Every day | ORAL | 3 refills | Status: DC

## 2017-07-27 DIAGNOSIS — Z96659 Presence of unspecified artificial knee joint: Secondary | ICD-10-CM | POA: Diagnosis not present

## 2017-07-27 DIAGNOSIS — J449 Chronic obstructive pulmonary disease, unspecified: Secondary | ICD-10-CM | POA: Diagnosis not present

## 2017-07-28 ENCOUNTER — Telehealth: Payer: Self-pay | Admitting: *Deleted

## 2017-07-28 NOTE — Telephone Encounter (Signed)
Yes, that is fine. 

## 2017-07-28 NOTE — Telephone Encounter (Signed)
Patient was to get shingles vaccines prior to being diagnosed with cancer. She states Dr Blenda Peals wanted her to wait until he finished radiation therapy before getting them, She is now done and has started on Letrozole and is asking if she is safe to get the shingles vaccine now. Please advise

## 2017-07-28 NOTE — Telephone Encounter (Signed)
Call returned to patient and advised that per doctor ok to proceed with shingles vaccine. I had to leave a message.

## 2017-08-02 ENCOUNTER — Ambulatory Visit: Payer: PPO | Admitting: Radiation Oncology

## 2017-08-11 ENCOUNTER — Other Ambulatory Visit: Payer: Self-pay

## 2017-08-11 ENCOUNTER — Ambulatory Visit
Admission: RE | Admit: 2017-08-11 | Discharge: 2017-08-11 | Disposition: A | Discharge status: Home / Self Care | Payer: PPO | Source: Ambulatory Visit | Attending: Radiation Oncology | Admitting: Radiation Oncology

## 2017-08-11 ENCOUNTER — Encounter: Payer: Self-pay | Admitting: Radiation Oncology

## 2017-08-11 VITALS — BP 167/80 | HR 76 | Temp 98.0°F | Resp 20 | Wt 204.0 lb

## 2017-08-11 DIAGNOSIS — C50412 Malignant neoplasm of upper-outer quadrant of left female breast: Secondary | ICD-10-CM | POA: Insufficient documentation

## 2017-08-11 DIAGNOSIS — Z170 Estrogen receptor positive status [ER+]: Secondary | ICD-10-CM | POA: Diagnosis not present

## 2017-08-11 DIAGNOSIS — Z923 Personal history of irradiation: Secondary | ICD-10-CM | POA: Insufficient documentation

## 2017-08-11 NOTE — Progress Notes (Signed)
Radiation Oncology Follow up Note  Name: Kelsey Young   Date:   08/11/2017 MRN:  [id number] DOB: 06/12/1932    This 84 y.o. female presents to the clinic today for one-month follow-up status post radiation therapy to her left breast for stage I ER positive PR negative invasive mammary carcinoma  REFERRING PROVIDER: Perrin Maltese, MD  HPI: Patient is a 84 year old female now out 1 month having completed radiation therapy to her left breast for stage I ER positive PR negative invasive mammary carcinoma status post wide local excision. She is seen today in routine follow-up and is doing well area of inflammation of left axilla has cleared completely. She specifically denies breast tenderness cough or bone pain.Marland Kitchen She's been started on Femara states she's having some more loose bowel movements and possibly some exacerbation of arthritis. She will be talking to medical oncology if that continues.  COMPLICATIONS OF TREATMENT: none  FOLLOW UP COMPLIANCE: keeps appointments   PHYSICAL EXAM:  BP (!) 167/80   Pulse 76   Temp 98 F (36.7 C)   Resp 20   Wt 204 lb 0.6 oz (92.5 kg)   BMI 41.21 kg/m  Lungs are clear to A&P cardiac examination essentially unremarkable with regular rate and rhythm. No dominant mass or nodularity is noted in either breast in 2 positions examined. Incision is well-healed. No axillary or supraclavicular adenopathy is appreciated. Cosmetic result is excellent. Well-developed well-nourished patient in NAD. HEENT reveals PERLA, EOMI, discs not visualized.  Oral cavity is clear. No oral mucosal lesions are identified. Neck is clear without evidence of cervical or supraclavicular adenopathy. Lungs are clear to A&P. Cardiac examination is essentially unremarkable with regular rate and rhythm without murmur rub or thrill. Abdomen is benign with no organomegaly or masses noted. Motor sensory and DTR levels are equal and symmetric in the upper and lower extremities. Cranial nerves II  through XII are grossly intact. Proprioception is intact. No peripheral adenopathy or edema is identified. No motor or sensory levels are noted. Crude visual fields are within normal range.  RADIOLOGY RESULTS: No current films for review  PLAN: Present time she is doing well recovering nicely from her radiation therapy treatments. I am please were overall progress. I've asked to see her back in 4-5 months for follow-up. She kidney continues on Femara will discuss that with medical oncology should her side effect profile persist. Patient is to call at anytime with any concerns.  I would like to take this opportunity to thank you for allowing me to participate in the care of your patient.Noreene Filbert, MD

## 2017-08-12 ENCOUNTER — Ambulatory Visit
Admission: RE | Admit: 2017-08-12 | Discharge: 2017-08-12 | Disposition: A | Discharge status: Home / Self Care | Payer: PPO | Source: Ambulatory Visit | Attending: Oncology | Admitting: Oncology

## 2017-08-12 DIAGNOSIS — Z79899 Other long term (current) drug therapy: Secondary | ICD-10-CM | POA: Insufficient documentation

## 2017-08-12 DIAGNOSIS — C50412 Malignant neoplasm of upper-outer quadrant of left female breast: Secondary | ICD-10-CM | POA: Diagnosis not present

## 2017-08-12 DIAGNOSIS — Z780 Asymptomatic menopausal state: Secondary | ICD-10-CM | POA: Diagnosis not present

## 2017-08-12 DIAGNOSIS — Z13820 Encounter for screening for osteoporosis: Secondary | ICD-10-CM | POA: Diagnosis not present

## 2017-08-19 ENCOUNTER — Encounter: Payer: Self-pay | Admitting: *Deleted

## 2017-08-19 NOTE — Progress Notes (Signed)
  Oncology Nurse Navigator Documentation  Navigator Location: [facility] (08/19/17 0900)   )Navigator Encounter Type: Letter/Fax/Email (08/19/17 0900)                                                    Time Spent with Patient: 15 (08/19/17 0900)   Thinking of you card mailed to patient.  She has completed radiation therapy.

## 2017-08-26 DIAGNOSIS — J449 Chronic obstructive pulmonary disease, unspecified: Secondary | ICD-10-CM | POA: Diagnosis not present

## 2017-08-26 DIAGNOSIS — Z96659 Presence of unspecified artificial knee joint: Secondary | ICD-10-CM | POA: Diagnosis not present

## 2017-09-23 DIAGNOSIS — E119 Type 2 diabetes mellitus without complications: Secondary | ICD-10-CM | POA: Diagnosis not present

## 2017-09-23 DIAGNOSIS — I10 Essential (primary) hypertension: Secondary | ICD-10-CM | POA: Diagnosis not present

## 2017-09-23 DIAGNOSIS — E782 Mixed hyperlipidemia: Secondary | ICD-10-CM | POA: Diagnosis not present

## 2017-09-23 DIAGNOSIS — E039 Hypothyroidism, unspecified: Secondary | ICD-10-CM | POA: Diagnosis not present

## 2017-09-24 DIAGNOSIS — Z96659 Presence of unspecified artificial knee joint: Secondary | ICD-10-CM | POA: Diagnosis not present

## 2017-09-24 DIAGNOSIS — J449 Chronic obstructive pulmonary disease, unspecified: Secondary | ICD-10-CM | POA: Diagnosis not present

## 2017-09-27 DIAGNOSIS — I10 Essential (primary) hypertension: Secondary | ICD-10-CM | POA: Diagnosis not present

## 2017-09-27 DIAGNOSIS — E782 Mixed hyperlipidemia: Secondary | ICD-10-CM | POA: Diagnosis not present

## 2017-09-27 DIAGNOSIS — R0602 Shortness of breath: Secondary | ICD-10-CM | POA: Diagnosis not present

## 2017-09-27 DIAGNOSIS — E119 Type 2 diabetes mellitus without complications: Secondary | ICD-10-CM | POA: Diagnosis not present

## 2017-09-27 DIAGNOSIS — E668 Other obesity: Secondary | ICD-10-CM | POA: Diagnosis not present

## 2017-09-27 DIAGNOSIS — I2510 Atherosclerotic heart disease of native coronary artery without angina pectoris: Secondary | ICD-10-CM | POA: Diagnosis not present

## 2017-10-10 NOTE — Progress Notes (Signed)
[place]  [phone] [phone]) [phone]  ID: Kelsey Young OB: Jan 05, 1932  MR#: [id number]  HAL#:[id number]  Patient Care Team: Perrin Maltese, MD as PCP - General (Internal Medicine)  CHIEF COMPLAINT: Pathologic stage Ia ER positive, PR/HER-2 negative invasive carcinoma of the upper outer quadrant of the left breast.  INTERVAL HISTORY: Patient returns to clinic today for routine 24-monthfollow-up.  She has multiple medical complaints and it is difficult to ascertain if these are chronic or possibly related to letrozole.  She feels as though her "thyroid is pressing up against her throat".  She has no neurologic complaints. She denies any recent fevers or illnesses. She has a good appetite and denies weight loss. She has no chest pain, but complains of increased shortness of breath.  She has worsening reflux symptoms.  She has mild abdominal discomfort, but denies any nausea, vomiting, constipation, or diarrhea. She has no urinary complaints.  Patient offers no further specific complaints.  REVIEW OF SYSTEMS:   Review of Systems  Constitutional: Positive for malaise/fatigue. Negative for fever and weight loss.  HENT: Positive for sore throat.   Respiratory: Positive for shortness of breath. Negative for cough.   Cardiovascular: Negative.  Negative for chest pain and leg swelling.  Gastrointestinal: Positive for abdominal pain, diarrhea and heartburn. Negative for constipation, nausea and vomiting.  Genitourinary: Negative.   Musculoskeletal: Negative.   Skin: Negative.  Negative for rash.  Neurological: Negative.  Negative for sensory change, focal weakness and weakness.  Psychiatric/Behavioral: Negative.  The patient is not nervous/anxious.     As per HPI. Otherwise, a complete review of systems is negative.  PAST MEDICAL HISTORY: Past Medical History:  Diagnosis Date  . [place]   left breast  . Complication of anesthesia    has  been slow to wake up  . COPD (chronic obstructive pulmonary disease) ([place]   . [place]   . Dyspnea   . Edema leg 03/2017   bilateral swelling  . Family history of adverse reaction to anesthesia    son had surgery and heart stopped during procedure  . GERD (gastroesophageal reflux disease)   . Heart murmur    no treatment  . Hemorrhoids 03/2017  . History of kidney stones    had eswl  . Hyperlipidemia   . Hypertension   . Hypothyroidism   . Lumbar stenosis   . Skin cancer 2016,2017,2018   face, back of neck, shoulder, leg, hand  . Sleep apnea    does not and will not use a cpap  . Thyroid disease     PAST SURGICAL HISTORY: Past Surgical History:  Procedure Laterality Date  . ABDOMINAL HYSTERECTOMY  1982  . APPENDECTOMY  1940  . BREAST EXCISIONAL BIOPSY Left 04/30/2017   left breast lumpectomy +  . BREAST LUMPECTOMY WITH NEEDLE LOCALIZATION AND AXILLARY SENTINEL LYMPH NODE BX Left 04/30/2017   Procedure: LEFT BREAST LUMPECTOMY WITH NEEDLE LOCALIZATION AND LEFT AXILLARY SENTINEL LYMPH NODE BX;  Surgeon: MJohnathan Hausen MD;  Location: [facility] ORS;  Service: General;  Laterality: Left;  . CHOLECYSTECTOMY    . EYE SURGERY Bilateral 2003   cataract extraction  . implants  2003   dental, not removable  . JOINT REPLACEMENT Bilateral [id number]  total hip replacements  . JOINT REPLACEMENT Right 2014   right total knee replacement  . SKIN CANCER EXCISION     left hand and right lower leg  . THYROIDECTOMY, PARTIAL  2003  . TONSILLECTOMY      FAMILY HISTORY: Family History  Problem Relation Age of Onset  . Heart failure Mother   . Heart attack Father   . Diabetes Brother   . Heart disease Brother   . Melanoma Brother   . Cancer Maternal Uncle   . Diabetes Paternal Aunt   . Heart disease Paternal Aunt   . Heart disease Paternal Grandmother   . Diabetes Maternal Aunt   . Heart attack Maternal Aunt   . Diabetes Maternal Aunt   .  Diabetes Paternal Aunt   . Heart disease Paternal Aunt   . Breast cancer Neg Hx     ADVANCED DIRECTIVES (Y/N):  N  HEALTH MAINTENANCE: Social History   Tobacco Use  . Smoking status: Former Smoker    Packs/day: 1.50    Types: Cigarettes    Last attempt to quit: 06/29/1985    Years since quitting: 32.3  . Smokeless tobacco: Never Used  Substance Use Topics  . Alcohol use: Yes    Alcohol/week: 0.6 - 1.2 oz    Types: 1 - 2 Standard drinks or equivalent per week    Comment: wine 3 times a week  . Drug use: No     Colonoscopy:  PAP:  Bone density:  Lipid panel:  Allergies  Allergen Reactions  . Contrast Media [Iodinated Diagnostic Agents] Hives, Shortness Of Breath and Other (See Comments)    Restless. Difficulty breathing. Topical betadine okay   . Sulfur Hives and Shortness Of Breath  . Codeine Nausea And Vomiting    With high doses, passes out  . Morphine Nausea And Vomiting    With high doses, passes out  . Oxycodone Nausea And Vomiting and Other (See Comments)    Dizzy and with high doses, passes out.  Marland Kitchen Penicillins Other (See Comments)    "Hardened lump" Has patient had a PCN reaction causing immediate rash, facial/tongue/throat swelling, SOB or lightheadedness with hypotension: No Has patient had a PCN reaction causing severe rash involving mucus membranes or skin necrosis: No Has patient had a PCN reaction that required hospitalization No Has patient had a PCN reaction occurring within the last 10 years: No If all of the above answers are "NO", then may proceed with Cephalosporin use.    . Sulfa Antibiotics Rash    Current Outpatient Medications  Medication Sig Dispense Refill  . albuterol (PROVENTIL HFA;VENTOLIN HFA) 108 (90 Base) MCG/ACT inhaler Inhale 2 puffs into the lungs every 4 (four) hours as needed for wheezing or shortness of breath. 1 Inhaler 10  . aspirin EC 81 MG EC tablet Take 1 tablet (81 mg total) by mouth daily. 30 tablet 3  . Coenzyme Q10  (CO Q 10 PO) Take 100 mg by mouth daily.     Marland Kitchen diltiazem (CARDIZEM CD) 300 MG 24 hr capsule Take 1 capsule (300 mg total) by mouth daily. (Patient taking differently: Take 300 mg by mouth at bedtime. ) 7 capsule 0  . Fluticasone-Salmeterol (ADVAIR DISKUS) 250-50 MCG/DOSE AEPB Inhale 1 puff into the lungs 2 (two) times daily. 60 each 1  . HYDROcodone-acetaminophen (NORCO/VICODIN) 5-325 MG tablet Take 1 tablet by mouth every 4 (four) hours as needed for moderate pain or severe pain. 40 tablet 0  . ipratropium-albuterol (DUONEB) 0.5-2.5 (3) MG/3ML SOLN Take 3 mLs by nebulization every 4 (four) hours. 360 mL 12  . isosorbide mononitrate (IMDUR) 30 MG 24 hr tablet Take 1 tablet (30 mg total) by mouth daily. [place]  tablet 3  . letrozole (FEMARA) 2.5 MG tablet Take 1 tablet (2.5 mg total) by mouth daily. 30 tablet 3  . levothyroxine (SYNTHROID) 50 MCG tablet Take 1 tablet (50 mcg total) by mouth daily. 30 tablet 6  . losartan (COZAAR) 50 MG tablet Take 50 mg by mouth at bedtime.     . metFORMIN (GLUCOPHAGE) 500 MG tablet Take 500 mg by mouth 2 (two) times daily with a meal.     . metoprolol succinate (TOPROL-XL) 50 MG 24 hr tablet Take 50 mg by mouth at bedtime. Take with or immediately following a meal.    . Multiple Vitamins-Minerals (PRESERVISION AREDS 2) CAPS Take 1 capsule by mouth 2 (two) times daily.     . Naphazoline HCl (CLEAR EYES OP) Place 2 drops into both eyes as needed (for dry eyes).    . naproxen sodium (ANAPROX) 220 MG tablet Take 220-440 mg by mouth 2 (two) times daily as needed (for pain).    Marland Kitchen omeprazole (PRILOSEC) 20 MG capsule Take 20 mg by mouth daily as needed (for heartburn).     . pravastatin (PRAVACHOL) 40 MG tablet     . simvastatin (ZOCOR) 20 MG tablet Take 20 mg by mouth at bedtime.    . triamterene-hydrochlorothiazide (MAXZIDE-25) 37.5-25 MG per tablet Take 0.5 tablets by mouth daily.     No current facility-administered medications for this visit.     OBJECTIVE: Vitals:    10/13/17 1127 10/13/17 1137  BP:  134/79  Pulse:  76  Resp: 16   Temp:  (!) 97.2 F (36.2 C)     Body mass index is 40.82 kg/m.    ECOG FS:0 - Asymptomatic  General: Well-developed, well-nourished, no acute distress. Eyes: Pink conjunctiva, anicteric sclera. HEENT: Normocephalic, moist mucous membranes, clear oropharnyx. Breasts: Patient requested exam be deferred today. Lungs: Clear to auscultation bilaterally. Heart: Regular rate and rhythm. No rubs, murmurs, or gallops. Abdomen: Soft, nontender, nondistended. No organomegaly noted, normoactive bowel sounds. Musculoskeletal: No edema, cyanosis, or clubbing. Neuro: Alert, answering all questions appropriately. Cranial nerves grossly intact. Skin: No rashes or petechiae noted. Psych: Normal affect.   LAB RESULTS:  Lab Results  Component Value Date   NA 139 04/22/2017   K 4.2 04/22/2017   CL 101 04/22/2017   CO2 27 04/22/2017   GLUCOSE 105 (H) 04/22/2017   BUN 16 04/22/2017   CREATININE 0.79 04/22/2017   CALCIUM 10.3 04/22/2017   PROT 7.3 11/23/2016   ALBUMIN 4.0 11/23/2016   AST 26 11/23/2016   ALT 17 11/23/2016   ALKPHOS 82 11/23/2016   BILITOT 0.6 11/23/2016   GFRNONAA >60 04/22/2017   GFRAA >60 04/22/2017    Lab Results  Component Value Date   WBC 9.2 04/22/2017   NEUTROABS 4.9 12/04/2013   HGB 14.5 04/22/2017   HCT 44.2 04/22/2017   MCV 91.3 04/22/2017   PLT 310 04/22/2017     STUDIES: No results found.  ASSESSMENT: Pathologic stage Ia ER positive, PR/HER-2 negative invasive carcinoma of the upper outer quadrant of the left breast.  PLAN:    1.  Pathologic stage Ia ER positive, PR/HER-2 negative invasive carcinoma of the upper outer quadrant of the left breast: Given the small size of patient's tumor and her advanced age, it was decided upon that chemotherapy was not necessary and Oncotype DX was not ordered.  Patient completed adjuvant XRT.  Continue letrozole for a total of 5 years completing in  January 2024.  Baseline bone marrow density on  August 12, 2017 reported T score of -0.9 which is considered normal.  Repeat in February 2021.  Patient's next mammogram will be due in approximately November 2019.  Return to clinic in 6 months for routine evaluation. 2.  Abdominal discomfort, shortness of breath, thyroid complaints: All possibly related to worsening reflux.  Patient reports she recently discontinued omeprazole.  She has been instructed to reinitiate treatment and follow-up with her primary care in the next several weeks.  Approximately 30 minutes was spent in discussion of which greater than 50% was consultation.     Patient expressed understanding and was in agreement with this plan. She also understands that She can call clinic at any time with any questions, concerns, or complaints.   Cancer Staging Primary cancer of upper outer quadrant of left female breast [facility]) Staging form: Breast, AJCC 8th Edition - Clinical stage from 04/17/2017: Stage IA (cT1a, cN0, cM0, G2, ER: Positive, PR: Negative, HER2: Negative) - Signed by Lloyd Huger, MD on 04/17/2017 - Pathologic stage from 05/11/2017: Stage IA (pT1b, pN0, cM0, G2, ER: Positive, PR: Negative, HER2: Negative) - Signed by Lloyd Huger, MD on 05/11/2017   Lloyd Huger, MD   10/17/2017 3:03 PM

## 2017-10-13 ENCOUNTER — Encounter: Payer: Self-pay | Admitting: Oncology

## 2017-10-13 ENCOUNTER — Other Ambulatory Visit: Payer: Self-pay

## 2017-10-13 ENCOUNTER — Inpatient Hospital Stay: Payer: PPO | Attending: Oncology | Admitting: Oncology

## 2017-10-13 VITALS — BP 134/79 | HR 76 | Temp 97.2°F | Resp 16 | Ht 59.0 in | Wt 202.1 lb

## 2017-10-13 DIAGNOSIS — I890 Lymphedema, not elsewhere classified: Secondary | ICD-10-CM | POA: Diagnosis not present

## 2017-10-13 DIAGNOSIS — C50412 Malignant neoplasm of upper-outer quadrant of left female breast: Secondary | ICD-10-CM | POA: Diagnosis not present

## 2017-10-13 DIAGNOSIS — R0602 Shortness of breath: Secondary | ICD-10-CM | POA: Diagnosis not present

## 2017-10-13 DIAGNOSIS — N61 Mastitis without abscess: Secondary | ICD-10-CM | POA: Insufficient documentation

## 2017-10-13 DIAGNOSIS — R109 Unspecified abdominal pain: Secondary | ICD-10-CM | POA: Diagnosis not present

## 2017-10-13 NOTE — Progress Notes (Signed)
Patient here for follow up, she is having many side effects from what she suspects  That it is from Letrozole. She is extremely short of breath and has upper abdominal discomfort. She has a feeling her throat "like her thyroid is pressing up against her throat. She reports an irregular HR.

## 2017-10-25 DIAGNOSIS — Z96659 Presence of unspecified artificial knee joint: Secondary | ICD-10-CM | POA: Diagnosis not present

## 2017-10-25 DIAGNOSIS — J449 Chronic obstructive pulmonary disease, unspecified: Secondary | ICD-10-CM | POA: Diagnosis not present

## 2017-10-26 ENCOUNTER — Other Ambulatory Visit: Payer: Self-pay | Admitting: *Deleted

## 2017-10-26 ENCOUNTER — Telehealth: Payer: Self-pay | Admitting: *Deleted

## 2017-10-26 ENCOUNTER — Inpatient Hospital Stay (HOSPITAL_BASED_OUTPATIENT_CLINIC_OR_DEPARTMENT_OTHER): Payer: PPO | Admitting: Oncology

## 2017-10-26 VITALS — BP 111/70 | HR 83 | Temp 98.8°F | Resp 24 | Wt 203.0 lb

## 2017-10-26 DIAGNOSIS — N61 Mastitis without abscess: Secondary | ICD-10-CM | POA: Diagnosis not present

## 2017-10-26 DIAGNOSIS — I890 Lymphedema, not elsewhere classified: Secondary | ICD-10-CM | POA: Diagnosis not present

## 2017-10-26 DIAGNOSIS — C50412 Malignant neoplasm of upper-outer quadrant of left female breast: Secondary | ICD-10-CM | POA: Diagnosis not present

## 2017-10-26 MED ORDER — CEPHALEXIN 500 MG PO CAPS: 500.0000 mg | ORAL_CAPSULE | Freq: Four times a day (QID) | ORAL | 0 refills | Status: DC

## 2017-10-26 MED ORDER — LETROZOLE 2.5 MG PO TABS: 2.5000 mg | ORAL_TABLET | Freq: Every day | ORAL | 3 refills | Status: DC

## 2017-10-26 NOTE — Telephone Encounter (Signed)
Agreed -

## 2017-10-26 NOTE — Telephone Encounter (Signed)
Per Lorretta Harp, NP, patient needs to be seen. Patient has accepted an appointment for 1130 this morning

## 2017-10-26 NOTE — Telephone Encounter (Signed)
I would say ,have her come in to symptom management so we can take a look at it.   Thanks

## 2017-10-26 NOTE — Progress Notes (Signed)
Symptom Management Consult note [facility]  [phone] [phone]  Patient Care Team: Perrin Maltese, MD as PCP - General (Internal Medicine)   Name of the patient: Kelsey Young  [id number]  25-Nov-1931   Date of visit: 10/28/17  Diagnosis- Stage Ia ER positive, PR/HER-2 negative invasive carcinoma of the upper outer quadrant of the left breast  Chief complaint/ Reason for visit- Cellulitis of left breast   Heme/Onc history: Patient last seen oncologist Dr. Grayland Ormond on 10/13/2017 for routine 76-monthfollow-up after initiating AI.  She complained of increased shortness of breath without chest pain, reflux symptoms, mild abdominal discomfort without nausea or vomiting.  It was uncertain if this was caused from chronic medical comorbidities or related to the initiation of letrozole.  She was instructed to restart omeprazole (recently discontinued by self) to see if symptoms improve.  She did not require chemotherapy based on the tumor size and her advanced age.  Completed adjuvant radiation on Jul 07, 2017.  Plan is to continue her letrozole for a total of 5 years completing in January 2024.  Baseline bone density from February 2019 reported a T score of -0.9 which is normal.  Repeat in February 2021.  Repeat mammogram will be due in November 2019.  Interval history-  JMadysonis a 84y.o. female who presents with erythema, tenderness, pain and chills.  Location: Left breast   Onset: acute  Duration: 1 weeks and symptoms are worsening  Associated symptoms: rash and edema  Recent treatment: Treated for celluitis by Dr. MHassell Done(Surgeon) in January with Doxycycline with complete resolution Functional status affected: yes  ECOG FS:0 - Asymptomatic  Review of systems- Review of Systems  Constitutional: Positive for chills. Negative for fever, malaise/fatigue and weight loss.  HENT: Negative for congestion and ear pain.   Eyes: Negative.  Negative  for blurred vision and double vision.  Respiratory: Negative.  Negative for cough, sputum production and shortness of breath.   Cardiovascular: Negative.  Negative for chest pain, palpitations and leg swelling.  Gastrointestinal: Negative.  Negative for abdominal pain, constipation, diarrhea, nausea and vomiting.  Genitourinary: Negative for dysuria, frequency and urgency.  Musculoskeletal: Negative for back pain and falls.       Left arm at axilla- painful and swelling  Skin: Positive for rash (Left breast ).  Neurological: Negative.  Negative for weakness and headaches.  Endo/Heme/Allergies: Negative.  Does not bruise/bleed easily.  Psychiatric/Behavioral: Negative.  Negative for depression. The patient is not nervous/anxious and does not have insomnia.      Current treatment- Letrozole- Started in Jan 2019  Allergies  Allergen Reactions  . Contrast Media [Iodinated Diagnostic Agents] Hives, Shortness Of Breath and Other (See Comments)    Restless. Difficulty breathing. Topical betadine okay   . Sulfur Hives and Shortness Of Breath  . Codeine Nausea And Vomiting    With high doses, passes out  . Morphine Nausea And Vomiting    With high doses, passes out  . Oxycodone Nausea And Vomiting and Other (See Comments)    Dizzy and with high doses, passes out.  .Marland KitchenPenicillins Other (See Comments)    "Hardened lump" Has patient had a PCN reaction causing immediate rash, facial/tongue/throat swelling, SOB or lightheadedness with hypotension: No Has patient had a PCN reaction causing severe rash involving mucus membranes or skin necrosis: No Has patient had a PCN reaction that required hospitalization No Has patient had a PCN reaction occurring within the last 10  years: No If all of the above answers are "NO", then may proceed with Cephalosporin use.    . Sulfa Antibiotics Rash     Past Medical History:  Diagnosis Date  . Cancer ([place]) 03/2017   left breast  . Complication of  anesthesia    has been slow to wake up  . COPD (chronic obstructive pulmonary disease) ([place])   . Diabetes mellitus without complication ([place])   . Dyspnea   . Edema leg 03/2017   bilateral swelling  . Family history of adverse reaction to anesthesia    son had surgery and heart stopped during procedure  . GERD (gastroesophageal reflux disease)   . Heart murmur    no treatment  . Hemorrhoids 03/2017  . History of kidney stones    had eswl  . Hyperlipidemia   . Hypertension   . Hypothyroidism   . Lumbar stenosis   . Skin cancer 2016,2017,2018   face, back of neck, shoulder, leg, hand  . Sleep apnea    does not and will not use a cpap  . Thyroid disease      Past Surgical History:  Procedure Laterality Date  . ABDOMINAL HYSTERECTOMY  1982  . APPENDECTOMY  1940  . BREAST EXCISIONAL BIOPSY Left 04/30/2017   left breast lumpectomy +  . BREAST LUMPECTOMY WITH NEEDLE LOCALIZATION AND AXILLARY SENTINEL LYMPH NODE BX Left 04/30/2017   Procedure: LEFT BREAST LUMPECTOMY WITH NEEDLE LOCALIZATION AND LEFT AXILLARY SENTINEL LYMPH NODE BX;  Surgeon: Johnathan Hausen, MD;  Location: [facility] ORS;  Service: General;  Laterality: Left;  . CHOLECYSTECTOMY    . EYE SURGERY Bilateral 2003   cataract extraction  . implants  2003   dental, not removable  . JOINT REPLACEMENT Bilateral [id number]   total hip replacements  . JOINT REPLACEMENT Right 2014   right total knee replacement  . SKIN CANCER EXCISION     left hand and right lower leg  . THYROIDECTOMY, PARTIAL  2003  . TONSILLECTOMY      Social History   Socioeconomic History  . Marital status: Widowed    Spouse name: Not on file  . Number of children: Not on file  . Years of education: Not on file  . Highest education level: Not on file  Occupational History  . Not on file  Social Needs  . Financial resource strain: Not on file  . Food insecurity:    Worry: Not on file    Inability: Not on file  . Transportation needs:     Medical: Not on file    Non-medical: Not on file  Tobacco Use  . Smoking status: Former Smoker    Packs/day: 1.50    Types: Cigarettes    Last attempt to quit: 06/29/1985    Years since quitting: 32.3  . Smokeless tobacco: Never Used  Substance and Sexual Activity  . Alcohol use: Yes    Alcohol/week: 0.6 - 1.2 oz    Types: 1 - 2 Standard drinks or equivalent per week    Comment: wine 3 times a week  . Drug use: No  . Sexual activity: Never  Lifestyle  . Physical activity:    Days per week: Not on file    Minutes per session: Not on file  . Stress: Not on file  Relationships  . Social connections:    Talks on phone: Not on file    Gets together: Not on file    Attends religious service: Not on file  Active member of club or organization: Not on file    Attends meetings of clubs or organizations: Not on file    Relationship status: Not on file  . Intimate partner violence:    Fear of current or ex partner: Not on file    Emotionally abused: Not on file    Physically abused: Not on file    Forced sexual activity: Not on file  Other Topics Concern  . Not on file  Social History Narrative  . Not on file    Family History  Problem Relation Age of Onset  . Heart failure Mother   . Heart attack Father   . Diabetes Brother   . Heart disease Brother   . Melanoma Brother   . Cancer Maternal Uncle   . Diabetes Paternal Aunt   . Heart disease Paternal Aunt   . Heart disease Paternal Grandmother   . Diabetes Maternal Aunt   . Heart attack Maternal Aunt   . Diabetes Maternal Aunt   . Diabetes Paternal Aunt   . Heart disease Paternal Aunt   . Breast cancer Neg Hx      Current Outpatient Medications:  .  albuterol (PROVENTIL HFA;VENTOLIN HFA) 108 (90 Base) MCG/ACT inhaler, Inhale 2 puffs into the lungs every 4 (four) hours as needed for wheezing or shortness of breath., Disp: 1 Inhaler, Rfl: 10 .  aspirin EC 81 MG EC tablet, Take 1 tablet (81 mg total) by mouth daily.,  Disp: 30 tablet, Rfl: 3 .  Coenzyme Q10 (CO Q 10 PO), Take 100 mg by mouth daily. , Disp: , Rfl:  .  diltiazem (CARDIZEM CD) 300 MG 24 hr capsule, Take 1 capsule (300 mg total) by mouth daily. (Patient taking differently: Take 300 mg by mouth at bedtime. ), Disp: 7 capsule, Rfl: 0 .  Fluticasone-Salmeterol (ADVAIR DISKUS) 250-50 MCG/DOSE AEPB, Inhale 1 puff into the lungs 2 (two) times daily., Disp: 60 each, Rfl: 1 .  ipratropium-albuterol (DUONEB) 0.5-2.5 (3) MG/3ML SOLN, Take 3 mLs by nebulization every 4 (four) hours., Disp: 360 mL, Rfl: 12 .  isosorbide mononitrate (IMDUR) 30 MG 24 hr tablet, Take 1 tablet (30 mg total) by mouth daily., Disp: 30 tablet, Rfl: 3 .  letrozole (FEMARA) 2.5 MG tablet, Take 1 tablet (2.5 mg total) by mouth daily., Disp: 30 tablet, Rfl: 3 .  levothyroxine (SYNTHROID) 50 MCG tablet, Take 1 tablet (50 mcg total) by mouth daily., Disp: 30 tablet, Rfl: 6 .  losartan (COZAAR) 50 MG tablet, Take 50 mg by mouth at bedtime. , Disp: , Rfl:  .  metFORMIN (GLUCOPHAGE) 500 MG tablet, Take 500 mg by mouth 2 (two) times daily with a meal. , Disp: , Rfl:  .  metoprolol succinate (TOPROL-XL) 50 MG 24 hr tablet, Take 50 mg by mouth at bedtime. Take with or immediately following a meal., Disp: , Rfl:  .  Multiple Vitamins-Minerals (PRESERVISION AREDS 2) CAPS, Take 1 capsule by mouth 2 (two) times daily. , Disp: , Rfl:  .  Naphazoline HCl (CLEAR EYES OP), Place 2 drops into both eyes as needed (for dry eyes)., Disp: , Rfl:  .  naproxen sodium (ANAPROX) 220 MG tablet, Take 220-440 mg by mouth 2 (two) times daily as needed (for pain)., Disp: , Rfl:  .  omeprazole (PRILOSEC) 20 MG capsule, Take 20 mg by mouth daily as needed (for heartburn). , Disp: , Rfl:  .  pravastatin (PRAVACHOL) 40 MG tablet, , Disp: , Rfl:  .  simvastatin (  ZOCOR) 20 MG tablet, Take 20 mg by mouth at bedtime., Disp: , Rfl:  .  triamterene-hydrochlorothiazide (MAXZIDE-25) 37.5-25 MG per tablet, Take 0.5 tablets by mouth  daily., Disp: , Rfl:  .  cephALEXin (KEFLEX) 500 MG capsule, Take 1 capsule (500 mg total) by mouth 4 (four) times daily., Disp: 25 capsule, Rfl: 0  Physical exam:  Vitals:   10/26/17 1139  BP: 111/70  Pulse: 83  Resp: (!) 24  Temp: 98.8 F (37.1 C)  TempSrc: Tympanic  SpO2: 96%  Weight: 203 lb (92.1 kg)   Physical Exam  Constitutional: She is oriented to person, place, and time. Vital signs are normal. She appears well-developed and well-nourished.  HENT:  Head: Normocephalic and atraumatic.  Eyes: Pupils are equal, round, and reactive to light.  Neck: Normal range of motion.  Cardiovascular: Normal rate, regular rhythm and normal heart sounds.  No murmur heard. Pulmonary/Chest: Effort normal and breath sounds normal. She has no wheezes. Left breast exhibits skin change and tenderness.    Abdominal: Soft. Normal appearance and bowel sounds are normal. She exhibits no distension. There is no tenderness.  Musculoskeletal: Normal range of motion. She exhibits no edema.  Lymphadenopathy:    She has no cervical adenopathy.    She has no axillary adenopathy.  NO adneopathy present- but tenderness present with mild edema noted in left axilla extending down the posterior arm.   Neurological: She is alert and oriented to person, place, and time.  Skin: Skin is warm and dry. No rash noted. There is erythema (to left breast).  Psychiatric: Judgment normal.     CMP Latest Ref Rng & Units 04/22/2017  Glucose 65 - 99 mg/dL 105(H)  BUN 6 - 20 mg/dL 16  Creatinine 0.44 - 1.00 mg/dL 0.79  Sodium 135 - 145 mmol/L 139  Potassium 3.5 - 5.1 mmol/L 4.2  Chloride 101 - 111 mmol/L 101  CO2 22 - 32 mmol/L 27  Calcium 8.9 - 10.3 mg/dL 10.3  Total Protein 6.5 - 8.1 g/dL -  Total Bilirubin 0.3 - 1.2 mg/dL -  Alkaline Phos 38 - 126 U/L -  AST 15 - 41 U/L -  ALT 14 - 54 U/L -   CBC Latest Ref Rng & Units 04/22/2017  WBC 3.6 - 11.0 K/uL 9.2  Hemoglobin 12.0 - 16.0 g/dL 14.5  Hematocrit 35.0  - 47.0 % 44.2  Platelets 150 - 440 K/uL 310    No images are attached to the encounter.  No results found.   Assessment and plan- Patient is a 84 y.o. female who presents for left breast swelling and pain and left arm "soreness".   1. Left breast Cancer: S/p adjuvant radiation on Jul 07, 2017. Plan is to continue her letrozole for a total of 5 years completing in January 2024.  Baseline bone density from February 2019 reported a T score of -0.9 which is normal.  Repeat in February 2021.  Repeat mammogram will be due in November 2019.  RTC for further evaluation in October 2019.   2. Left Breast Cellulitis: Patient previously treated with doxycycline approximately 5 months ago.  Thought to be from biopsy in November by Dr. Hassell Done.  She has not had any additional problems since then.  Breast is visibly red, swollen and hot to touch.  Patient is afebrile but does admit to occasional chills.  Vital signs are stable.  Labs are stable.  Will treat with RX Keflex 500 mg QID for 7 days.  Discussed with  patient's cross sensitivity with cephalosporins and penicillins.  She has taken amoxicillin in the past without further issue.  She should not have any problem with this antibiotic.  She was educated on signs and symptoms of anaphylaxis and she understands when she would need to seek medical help if the symptoms developed.  3. Left arm lymphedema: Possibly lymphedema.  Patient states she has been doing exercises but feels her left arm beginning at her axilla is becoming harder to manipulate due to tenderness and stiffness.  We will get her set up with the lymphedema clinic ASAP.  Appointment is made for Nov 08, 2016.   Visit Diagnosis 1. Lymphedema of left upper extremity   2. Cellulitis of left breast   3. Primary cancer of upper outer quadrant of left female breast [facility])     Patient expressed understanding and was in agreement with this plan. She also understands that She can call clinic at any time  with any questions, concerns, or complaints.   Greater than 50% was spent in counseling and coordination of care with this patient including but not limited to discussion of the relevant topics above (See A&P) including, but not limited to diagnosis and management of acute and chronic medical conditions.    Faythe Casa, AGNP-C [facility] [place]- [phone] Pager- [phone] 10/28/2017 10:04 AM

## 2017-10-26 NOTE — Telephone Encounter (Signed)
Patient called and reports that she has cellulitis again and is asking for antibiotics; also if she is calling the right doctor. States she does not feel well today. She states she has an outside appointment at 10 AM this morning and to leave a message as to whether or not she needs to be seen or what to do. Please advise

## 2017-10-27 MED ORDER — LETROZOLE 2.5 MG PO TABS: 2.5000 mg | ORAL_TABLET | Freq: Every day | ORAL | 3 refills | Status: DC

## 2017-11-08 ENCOUNTER — Ambulatory Visit: Payer: PPO | Attending: Oncology | Admitting: Occupational Therapy

## 2017-11-08 ENCOUNTER — Encounter: Payer: Self-pay | Admitting: Occupational Therapy

## 2017-11-08 ENCOUNTER — Other Ambulatory Visit: Payer: Self-pay

## 2017-11-08 DIAGNOSIS — I890 Lymphedema, not elsewhere classified: Secondary | ICD-10-CM | POA: Diagnosis not present

## 2017-11-08 NOTE — Patient Instructions (Addendum)
Pt to order from DME company  Unilateral mastectomy pad - jovipak   And then start doing short version of self MLD :  Manual Lymph Drainage  Do manual lymph drainage once each day to help decrease swelling.  This should take you about 20-30 minutes depending on the size of your limb.  For Left Arm:  3. Pump across chest from left to right 8 times 4. Pump down the left side of trunk from armpit to groin 8 times 5. Pump up the outside of left upper arm 8 times and inside of upper arm to outside                  8 x and then outside again 8 x 6. Pump across chest from L to R 8 times and  7          Pump  down the left side of trunk   Scar mobs and massage to lumpectomy scar - 5  X 3 0 sec   several times during day

## 2017-11-08 NOTE — Therapy (Signed)
[facility] [address] [place], [address] Phone: [phone]   Fax:  [phone]  Occupational Therapy Evaluation  Patient Details  Name: Kelsey Young MRN: [id number] Date of Birth: [birth year] Referring Provider: Grayland Ormond   Encounter Date: 11/08/2017  OT End of Session - 11/08/17 1446    Visit Number  1    Number of Visits  6    Date for OT Re-Evaluation  12/20/17    OT Start Time  1110    OT Stop Time  1222    OT Time Calculation (min)  72 min    Activity Tolerance  Patient tolerated treatment well    Behavior During Therapy  [facility] for tasks assessed/performed       Past Medical History:  Diagnosis Date  . Cancer ([place]) 03/2017   left breast  . Complication of anesthesia    has been slow to wake up  . COPD (chronic obstructive pulmonary disease) ([place])   . Diabetes mellitus without complication ([place])   . Dyspnea   . Edema leg 03/2017   bilateral swelling  . Family history of adverse reaction to anesthesia    son had surgery and heart stopped during procedure  . GERD (gastroesophageal reflux disease)   . Heart murmur    no treatment  . Hemorrhoids 03/2017  . History of kidney stones    had eswl  . Hyperlipidemia   . Hypertension   . Hypothyroidism   . Lumbar stenosis   . Skin cancer 2016,2017,2018   face, back of neck, shoulder, leg, hand  . Sleep apnea    does not and will not use a cpap  . Thyroid disease     Past Surgical History:  Procedure Laterality Date  . ABDOMINAL HYSTERECTOMY  1982  . APPENDECTOMY  1940  . BREAST EXCISIONAL BIOPSY Left 04/30/2017   left breast lumpectomy +  . BREAST LUMPECTOMY WITH NEEDLE LOCALIZATION AND AXILLARY SENTINEL LYMPH NODE BX Left 04/30/2017   Procedure: LEFT BREAST LUMPECTOMY WITH NEEDLE LOCALIZATION AND LEFT AXILLARY SENTINEL LYMPH NODE BX;  Surgeon: Johnathan Hausen, MD;  Location: [facility] ORS;  Service: General;  Laterality: Left;  .  CHOLECYSTECTOMY    . EYE SURGERY Bilateral 2003   cataract extraction  . implants  2003   dental, not removable  . JOINT REPLACEMENT Bilateral [id number]   total hip replacements  . JOINT REPLACEMENT Right 2014   right total knee replacement  . SKIN CANCER EXCISION     left hand and right lower leg  . THYROIDECTOMY, PARTIAL  2003  . TONSILLECTOMY      There were no vitals filed for this visit.  Subjective Assessment - 11/08/17 1434    Subjective   I had surgery in Nov and then radiation done in Jan - but then I had pain and issues in my L breast in Jan and now again couple of weeks ago - pain and tenderness stilll under my arm , and down my arm to my elbow - in my armpit it is tender and lump - and to my shouolder blade     Pertinent History  Pt had lumpectomy in 05/05/18 , and then Radiation was finished in January - she had episode of cellulitis in Jan and now 2 wks ago in L breast -both times did antibiotics - she is taking 5 yrs drug Letrozole     Patient Stated Goals  I do not want lymphedema in my  arm or for it to get worse - and want to tenderness and pain better under and in my arm     Currently in Pain?  Yes    Pain Score  3     Pain Location  Arm axilla - tender at scar 9/10     Pain Orientation  Left    Pain Descriptors / Indicators  Aching;Tender;Heaviness;Tightness uncomfortable     Pain Type  Surgical pain    Pain Onset  More than a month ago    Pain Frequency  Intermittent        [facility] OT Assessment - 11/08/17 0001      Assessment   Medical Diagnosis  L upper quadrant lymphedema     Referring Provider  Finnegan    Onset Date/Surgical Date  05/05/17    Hand Dominance  Right      Prior Function   Vocation  Retired    Leisure  Likes to read, do games on [employer] , do cook some and change her bed - little in yard        LYMPHEDEMA/ONCOLOGY QUESTIONNAIRE - 11/08/17 1440      Surgeries   Lumpectomy Date  05/05/17    Number Lymph Nodes Removed  -- 1 or 2  per pt       Treatment   Past Radiation Treatment  Yes    Date  -- finish in Jan 2019      What other symptoms do you have   Are you Having Heaviness or Tightness  Yes    Are you having Pain  Yes    Are you having pitting edema  No    Is it Hard or Difficult finding clothes that fit  No    Is there Decreased scar mobility  Yes      Lymphedema Stage   Stage  STAGE 1 SPONTANEOUSLY REVERSIBLE      Right Upper Extremity Lymphedema   15 cm Proximal to Olecranon Process  43.3 cm    10 cm Proximal to Olecranon Process  37 cm    Olecranon Process  26.3 cm    15 cm Proximal to Ulnar Styloid Process  24.8 cm    10 cm Proximal to Ulnar Styloid Process  21.7 cm    Just Proximal to Ulnar Styloid Process  17 cm    Across Hand at [employer]  20 cm    At [place] of 2nd Digit  6.7 cm    At [facility] of Thumb  7 cm      Left Upper Extremity Lymphedema   15 cm Proximal to Olecranon Process  43.3 cm    10 cm Proximal to Olecranon Process  38.5 cm    Olecranon Process  27.7 cm    15 cm Proximal to Ulnar Styloid Process  25.6 cm    10 cm Proximal to Ulnar Styloid Process  21.5 cm    Just Proximal to Ulnar Styloid Process  17 cm    Across Hand at [employer]  18.5 cm    At [place] of 2nd Digit  6.5 cm    At [facility] of Thumb  6.8 cm            Pt to order from DME company  Unilateral mastectomy pad - jovipak   And then start doing short version of self MLD :  Manual Lymph Drainage  Do manual lymph drainage once each day to help decrease swelling.  This should  take you about 20-30 minutes depending on the size of your limb.  For Left Arm:  3. Pump across chest from left to right 8 times 4. Pump down the left side of trunk from armpit to groin 8 times 5. Pump up the outside of left upper arm 8 times and inside of upper arm to outside                  8 x and then outside again 8 x 6. Pump across chest from L to R 8 times and  7          Pump  down the left side of trunk   Scar mobs and  massage to lumpectomy scar - 5  X 3 0 sec   several times during day           OT Education - 11/08/17 1445    Education provided  Yes    Education Details  Findings and MLD , scar mobs and getting compression piece for L quadrant    Person(s) Educated  Patient    Methods  Explanation;Demonstration;Verbal cues;Tactile cues          OT Long Term Goals - 11/08/17 1456      OT LONG TERM GOAL #1   Title  Pt to be independent in doing self MLD and using compression to decrease circumference in L UE     Baseline  no compression and knowledge on HEP     Time  4    Period  Weeks    Status  New    Target Date  12/06/17      OT LONG TERM GOAL #2   Title  Pt to show decrease pain , tenderness  over scar and and pull in L arm to less than 3/10     Baseline  pain at rest 0-3 - but pain and tenderness over scar in L axilla 9/10     Time  3    Period  Weeks    Status  New    Target Date  11/29/17            Plan - 11/08/17 1447    Clinical Impression Statement  Pt present with complains of L breast , under arm and arm feels uncomfortable , pain , tenderness and swelling - pt present with tenderness moslty at lumpectomy scar and then pulling sensation  down arm to elbow with reaching - scar adhere in L axilla and tender to touch  - pt show increase circumference in L compare to the R  by 0.8 cm in forearm, elbow 1.7 and upper arm 1.5 cm - pt did not show lymphedema under arm - but report and had 2 episodes of  cellulitis in L breast and under arm since Jan -  Pt AROM in bilateral UE WNL and strength in L 4+/5 - pt has been doing her AROM exercises since lymphedema class  - pt was ed on Self MLD and recommend unilateral mastectomy jovipak compressoin piece to clear thoracic - and will reassess her in week -  pt do have a cardialogy appt in June - wil wait until that appt  to issue pump if pt do not respond to  homeprogram     Occupational performance deficits (Please refer to  evaluation for details):  ADL's;Rest and Sleep;Leisure;Social Participation    Rehab Potential  Good    Current Impairments/barriers affecting progress:  2 episodes of cellulitis since Jan -  Heart murmur and COPD     OT Frequency  1x / week    OT Duration  6 weeks    OT Treatment/Interventions  Self-care/ADL training;Scar mobilization;Therapeutic exercise;Manual lymph drainage;Patient/family education    Plan  assess progress with homeprogram     Clinical Decision Making  Limited treatment options, no task modification necessary    OT Home Exercise Plan  see pt instruction     Consulted and Agree with Plan of Care  Patient       Patient will benefit from skilled therapeutic intervention in order to improve the following deficits and impairments:  Impaired flexibility, Increased edema, Decreased scar mobility, Pain, Impaired UE functional use  Visit Diagnosis: Lymphedema, not elsewhere classified - Plan: Ot plan of care cert/re-cert    Problem List Patient Active Problem List   Diagnosis Date Noted  . Primary cancer of upper outer quadrant of left female breast ([place]) 04/17/2017  . Hyponatremia 11/25/2016  . Palpitations 11/25/2016  . Chronic GERD 11/25/2016  . Chest tightness or pressure 11/24/2016  . Dyspnea 11/24/2016  . Neck nodule 11/24/2016  . Substernal goiter 11/24/2016  . Chest pain, rule out acute myocardial infarction 11/23/2016  . Hypoxia 11/06/2015  . DDD (degenerative disc disease), lumbar 11/12/2014  . Lumbar canal stenosis 11/12/2014  . Degenerative arthritis of lumbar spine with cord compression 11/12/2014  . Acid reflux 12/19/2013  . HLD (hyperlipidemia) 12/19/2013  . BP (high blood pressure) 12/19/2013  . Apnea, sleep 12/19/2013  . Diabetes mellitus, type 2 ([place]) 05/15/2013  . Adrenal adenoma 10/17/2012  . Intrathoracic goiter 10/17/2012    Rosalyn Gess OTR/L,CLT 11/08/2017, 3:05 PM  [facility] [address], [place], [address] Phone: [phone]   Fax:  [phone]  Name: MYKIRA HOFMEISTER MRN: [id number] Date of Birth: Sep 07, 1931

## 2017-11-15 ENCOUNTER — Ambulatory Visit: Payer: PPO | Admitting: Occupational Therapy

## 2017-11-15 DIAGNOSIS — I890 Lymphedema, not elsewhere classified: Secondary | ICD-10-CM | POA: Diagnosis not present

## 2017-11-15 NOTE — Patient Instructions (Signed)
Same But to do axilla stretch on wall - 5 x 30 sec  2 x day  And the scar massage also only 2 x day but 3 min

## 2017-11-15 NOTE — Therapy (Signed)
[facility] [address], [place], [address] Phone: [phone]   Fax:  [phone]  Occupational Therapy Treatment  Patient Details  Name: Kelsey Young MRN: [id number] Date of Birth: 11/28/1931 Referring Provider: Grayland Ormond   Encounter Date: 11/15/2017  OT End of Session - 11/15/17 1720    Visit Number  2    Number of Visits  6    Date for OT Re-Evaluation  12/20/17    OT Start Time  1335    OT Stop Time  1420    OT Time Calculation (min)  45 min    Activity Tolerance  Patient tolerated treatment well    Behavior During Therapy  [facility] for tasks assessed/performed       Past Medical History:  Diagnosis Date  . Cancer ([place]) 03/2017   left breast  . Complication of anesthesia    has been slow to wake up  . COPD (chronic obstructive pulmonary disease) ([place])   . Diabetes mellitus without complication ([place])   . Dyspnea   . Edema leg 03/2017   bilateral swelling  . Family history of adverse reaction to anesthesia    son had surgery and heart stopped during procedure  . GERD (gastroesophageal reflux disease)   . Heart murmur    no treatment  . Hemorrhoids 03/2017  . History of kidney stones    had eswl  . Hyperlipidemia   . Hypertension   . Hypothyroidism   . Lumbar stenosis   . Skin cancer 2016,2017,2018   face, back of neck, shoulder, leg, hand  . Sleep apnea    does not and will not use a cpap  . Thyroid disease     Past Surgical History:  Procedure Laterality Date  . ABDOMINAL HYSTERECTOMY  1982  . APPENDECTOMY  1940  . BREAST EXCISIONAL BIOPSY Left 04/30/2017   left breast lumpectomy +  . BREAST LUMPECTOMY WITH NEEDLE LOCALIZATION AND AXILLARY SENTINEL LYMPH NODE BX Left 04/30/2017   Procedure: LEFT BREAST LUMPECTOMY WITH NEEDLE LOCALIZATION AND LEFT AXILLARY SENTINEL LYMPH NODE BX;  Surgeon: Johnathan Hausen, MD;  Location: [facility] ORS;  Service: General;  Laterality: Left;  .  CHOLECYSTECTOMY    . EYE SURGERY Bilateral 2003   cataract extraction  . implants  2003   dental, not removable  . JOINT REPLACEMENT Bilateral [id number]   total hip replacements  . JOINT REPLACEMENT Right 2014   right total knee replacement  . SKIN CANCER EXCISION     left hand and right lower leg  . THYROIDECTOMY, PARTIAL  2003  . TONSILLECTOMY      There were no vitals filed for this visit.  Subjective Assessment - 11/15/17 1717    Subjective   My arm is doing okay - the pain is not going down into my elbow and the swelling is better- but upper arm inside still tender - and then the scar - but I think I did the massage maybe to much - I am lately more SOB than usual - will the massage increase my SOB     Patient Stated Goals  I do not want lymphedema in my arm or for it to get worse - and want to tenderness and pain better under and in my arm     Currently in Pain?  No/denies          LYMPHEDEMA/ONCOLOGY QUESTIONNAIRE - 11/15/17 1347      Left Upper Extremity Lymphedema  15 cm Proximal to Olecranon Process  43 cm    10 cm Proximal to Olecranon Process  38.2 cm    Olecranon Process  27.8 cm    15 cm Proximal to Ulnar Styloid Process  26 cm    10 cm Proximal to Ulnar Styloid Process  22 cm    Just Proximal to Ulnar Styloid Process  17.4 cm    Across Hand at [employer]  18.5 cm    At [place] of 2nd Digit  6.5 cm    At [facility] of Thumb  6.5 cm         Pt  Still waiting for her  Unilateral mastectomy pad - jovipak   And then review with pt short version of self MLD :  Manual Lymph Drainage  Do manual lymph drainage once each day to help decrease swelling.  This should take you about 20-30 minutes depending on the size of your limb.  For Left Arm:  3. Pump across chest from left to right 8 times 4. Pump down the left side of trunk from armpit to groin 8 times 5.         Pump up the outside of left upper arm 8 times and inside of upper arm to outside                   8 x and then outside again 8 x 6.         Pump across chest from L to R 8 times and  7          Pump  down the left side of trunk   BUT add this date  12. Pump up the back of the forearm from wrist to elbow 8 times 13. Pump up the outside of left upper arm 8 times 14. Pump across chest from left to right 8 times   Scar mobs and massage to lumpectomy scar - 2 x day  5  X 3 0 sec  And axilla stretch for soft tissue and scar adhesion  - pt had some pain - but decrease when stop  Done scar mobs to one in axilla - pt very tender - and adhere - did xtractor 3 x and pt had no discomfort                OT Education - 11/15/17 1720    Education provided  Yes    Education Details  scar massage and self MLD review again     Person(s) Educated  Patient    Methods  Handout;Explanation;Demonstration    Comprehension  Verbalized understanding;Returned demonstration          OT Long Term Goals - 11/08/17 1456      OT LONG TERM GOAL #1   Title  Pt to be independent in doing self MLD and using compression to decrease circumference in L UE     Baseline  no compression and knowledge on HEP     Time  4    Period  Weeks    Status  New    Target Date  12/06/17      OT LONG TERM GOAL #2   Title  Pt to show decrease pain , tenderness  over scar and and pull in L arm to less than 3/10     Baseline  pain at rest 0-3 - but pain and tenderness over scar in L axilla 9/10     Time  3  Period  Weeks    Status  New    Target Date  11/29/17            Plan - 11/15/17 1721    Clinical Impression Statement  Pt circumference of L UE still same as last time - did not get her jovipak breat pad yet - did do some scar massage and self MLD - needed some review and change her scar mobs and massage - not to do it as much - can cause tenderness -still adhere in axilla but pain or pull not going down into elbow anymore -  pt to cont with self MLD - do not think she is moving so much fluid  with self MLD to increase her SOB but she  do have appt with cardiologits in 2 wks - and cont with scar mobs     Occupational performance deficits (Please refer to evaluation for details):  ADL's;Rest and Sleep;Leisure;Social Participation    Rehab Potential  Good    Current Impairments/barriers affecting progress:  2 episodes of cellulitis since Jan - Heart murmur and COPD     OT Frequency  1x / week    OT Duration  6 weeks    OT Treatment/Interventions  Self-care/ADL training;Scar mobilization;Therapeutic exercise;Manual lymph drainage;Patient/family education    Plan  assess progress with homeprogram and if she got her jovipak yet     Clinical Decision Making  Limited treatment options, no task modification necessary    OT Home Exercise Plan  see pt instruction     Consulted and Agree with Plan of Care  Patient       Patient will benefit from skilled therapeutic intervention in order to improve the following deficits and impairments:  Impaired flexibility, Increased edema, Decreased scar mobility, Pain, Impaired UE functional use  Visit Diagnosis: Lymphedema, not elsewhere classified    Problem List Patient Active Problem List   Diagnosis Date Noted  . Primary cancer of upper outer quadrant of left female breast ([place]) 04/17/2017  . Hyponatremia 11/25/2016  . Palpitations 11/25/2016  . Chronic GERD 11/25/2016  . Chest tightness or pressure 11/24/2016  . Dyspnea 11/24/2016  . Neck nodule 11/24/2016  . Substernal goiter 11/24/2016  . Chest pain, rule out acute myocardial infarction 11/23/2016  . Hypoxia 11/06/2015  . DDD (degenerative disc disease), lumbar 11/12/2014  . Lumbar canal stenosis 11/12/2014  . Degenerative arthritis of lumbar spine with cord compression 11/12/2014  . Acid reflux 12/19/2013  . HLD (hyperlipidemia) 12/19/2013  . BP (high blood pressure) 12/19/2013  . Apnea, sleep 12/19/2013  . Diabetes mellitus, type 2 ([place]) 05/15/2013  . Adrenal adenoma 10/17/2012   . Intrathoracic goiter 10/17/2012    Rosalyn Gess OTR/l,CLT 11/15/2017, 5:25 PM  [facility] [address] [place], [address] Phone: [phone]   Fax:  [phone]  Name: ROLAND PRINE MRN: [id number] Date of Birth: [birth year]

## 2017-11-24 ENCOUNTER — Ambulatory Visit: Payer: PPO | Admitting: Occupational Therapy

## 2017-11-24 DIAGNOSIS — Z4432 Encounter for fitting and adjustment of external left breast prosthesis: Secondary | ICD-10-CM | POA: Diagnosis not present

## 2017-11-24 DIAGNOSIS — J449 Chronic obstructive pulmonary disease, unspecified: Secondary | ICD-10-CM | POA: Diagnosis not present

## 2017-11-24 DIAGNOSIS — Z96659 Presence of unspecified artificial knee joint: Secondary | ICD-10-CM | POA: Diagnosis not present

## 2017-11-24 DIAGNOSIS — C50112 Malignant neoplasm of central portion of left female breast: Secondary | ICD-10-CM | POA: Diagnosis not present

## 2017-11-30 ENCOUNTER — Ambulatory Visit: Payer: PPO | Attending: Oncology | Admitting: Occupational Therapy

## 2017-11-30 DIAGNOSIS — I890 Lymphedema, not elsewhere classified: Secondary | ICD-10-CM | POA: Insufficient documentation

## 2017-11-30 NOTE — Therapy (Signed)
[facility] [address], [place], [address] Phone: [phone]   Fax:  [phone]  Occupational Therapy Treatment  Patient Details  Name: Kelsey Young MRN: [id number] Date of Birth: 10-07-1931 Referring Provider: Grayland Ormond   Encounter Date: 11/30/2017  OT End of Session - 11/30/17 1813    Visit Number  3    Number of Visits  6    Date for OT Re-Evaluation  12/20/17    OT Start Time  1345    OT Stop Time  1429    OT Time Calculation (min)  44 min    Activity Tolerance  Patient tolerated treatment well    Behavior During Therapy  [facility] for tasks assessed/performed       Past Medical History:  Diagnosis Date  . Cancer ([name]) 03/2017   left breast  . Complication of anesthesia    has been slow to wake up  . COPD (chronic obstructive pulmonary disease) ([place])   . Diabetes mellitus without complication ([place])   . Dyspnea   . Edema leg 03/2017   bilateral swelling  . Family history of adverse reaction to anesthesia    son had surgery and heart stopped during procedure  . GERD (gastroesophageal reflux disease)   . Heart murmur    no treatment  . Hemorrhoids 03/2017  . History of kidney stones    had eswl  . Hyperlipidemia   . Hypertension   . Hypothyroidism   . Lumbar stenosis   . Skin cancer 2016,2017,2018   face, back of neck, shoulder, leg, hand  . Sleep apnea    does not and will not use a cpap  . Thyroid disease     Past Surgical History:  Procedure Laterality Date  . ABDOMINAL HYSTERECTOMY  1982  . APPENDECTOMY  1940  . BREAST EXCISIONAL BIOPSY Left 04/30/2017   left breast lumpectomy +  . BREAST LUMPECTOMY WITH NEEDLE LOCALIZATION AND AXILLARY SENTINEL LYMPH NODE BX Left 04/30/2017   Procedure: LEFT BREAST LUMPECTOMY WITH NEEDLE LOCALIZATION AND LEFT AXILLARY SENTINEL LYMPH NODE BX;  Surgeon: Johnathan Hausen, MD;  Location: [facility] ORS;  Service: General;  Laterality: Left;  .  CHOLECYSTECTOMY    . EYE SURGERY Bilateral 2003   cataract extraction  . implants  2003   dental, not removable  . JOINT REPLACEMENT Bilateral [id number]   total hip replacements  . JOINT REPLACEMENT Right 2014   right total knee replacement  . SKIN CANCER EXCISION     left hand and right lower leg  . THYROIDECTOMY, PARTIAL  2003  . TONSILLECTOMY      There were no vitals filed for this visit.  Subjective Assessment - 11/30/17 1811    Subjective   My arm feels better and the pain - but you need to go over the massage again and wearing of this compression piece under my arm -they do have me down for survivorship in Aug     Patient Stated Goals  I do not want lymphedema in my arm or for it to get worse - and want to tenderness and pain better under and in my arm     Currently in Pain?  No/denies          LYMPHEDEMA/ONCOLOGY QUESTIONNAIRE - 11/30/17 1401      Left Upper Extremity Lymphedema   15 cm Proximal to Olecranon Process  42.2 cm    10 cm Proximal to Olecranon Process  37.5 cm  Olecranon Process  27.3 cm    15 cm Proximal to Ulnar Styloid Process  26 cm    10 cm Proximal to Ulnar Styloid Process  21.5 cm    Just Proximal to Ulnar Styloid Process  16.8 cm    Across Hand at [employer]  18.5 cm    At [place] of 2nd Digit  6.2 cm    At [facility] of Thumb  6.7 cm      assess use of jovipak unilateral mastectomy pad - pin in bra -and to fasten in front  Pt to attach maybe with velcro to donn easier - pt to wear as needed if feeling breast or under arm is full , tight , or swollen - or arm is swollen    review with pt short version of self MLD again  Manual Lymph Drainage  Do manual lymph drainage once each day to help decrease swelling. This should take you about 20-30 minutes depending on the size of your limb.  For Left Arm:  3. Pump across chest from left to right 8 times 4. Pump down the left side of trunk from armpit to groin 8 times 5.Pump up the  outside of left upper arm 8 times and inside of upper arm to outside 8 x and then outside again 8 x 6.Pump across chest from L to R 8 timesand  7 Pump down the left side of trunk   BUT add this date  12.       Pump up the back of the forearm from wrist to elbow 8 times 13.       Pump up the outside of left upper arm 8 times 14. Pump across chest from left to right 8 times   Scar mobs and massage to lumpectomy scar - 2 x day  5 X 3 0 sec  And axilla stretch for soft tissue and scar adhesion  - pt had some pain - but decrease when stop - tenderness improving  Done scar mobs to the scar  in axilla - less tenderness and less adhere             OT Education - 11/30/17 1813    Education provided  Yes    Education Details  wearing of jovipak breast pad and review scar massage and self MLD    Person(s) Educated  Patient    Methods  Explanation;Demonstration    Comprehension  Verbalized understanding;Returned demonstration          OT Long Term Goals - 11/30/17 1816      OT LONG TERM GOAL #1   Title  Pt to be independent in doing self MLD and using compression to decrease circumference in L UE     Baseline  wearing jovipak - ed on use this date and doing self MLD - upper arm decrease but elbow and forearm still increase on L     Time  4    Period  Weeks    Status  On-going    Target Date  12/06/17      OT LONG TERM GOAL #2   Title  Pt to show decrease pain , tenderness  over scar and and pull in L arm to less than 3/10     Baseline  pain at rest 0-3 - but pain and tenderness over scar in L axilla  improving and increase AROM with less pain     Time  2    Period  Weeks  Status  On-going    Target Date  12/06/17            Plan - 11/30/17 1814    Clinical Impression Statement  Pt's L UE circumference decrease from elbow to upper arm - elbow and forearm still increase - but review self MLD this date - and ed on wearing of  jovipak breast pad inside of bra or camisole - pt to cont with HEP for about month and will follow up with her     Occupational performance deficits (Please refer to evaluation for details):  ADL's;Rest and Sleep;Leisure;Social Participation    Rehab Potential  Good    Current Impairments/barriers affecting progress:  2 episodes of cellulitis since Jan - Heart murmur and COPD     OT Frequency  1x / week    OT Duration  4 weeks    OT Treatment/Interventions  Self-care/ADL training;Scar mobilization;Therapeutic exercise;Manual lymph drainage;Patient/family education    Plan  assess progress with homeprogram in month     Clinical Decision Making  Limited treatment options, no task modification necessary    OT Home Exercise Plan  see pt instruction     Consulted and Agree with Plan of Care  Patient       Patient will benefit from skilled therapeutic intervention in order to improve the following deficits and impairments:  Impaired flexibility, Increased edema, Decreased scar mobility, Pain, Impaired UE functional use  Visit Diagnosis: Lymphedema, not elsewhere classified    Problem List Patient Active Problem List   Diagnosis Date Noted  . Primary cancer of upper outer quadrant of left female breast ([place]) 04/17/2017  . Hyponatremia 11/25/2016  . Palpitations 11/25/2016  . Chronic GERD 11/25/2016  . Chest tightness or pressure 11/24/2016  . Dyspnea 11/24/2016  . Neck nodule 11/24/2016  . Substernal goiter 11/24/2016  . Chest pain, rule out acute myocardial infarction 11/23/2016  . Hypoxia 11/06/2015  . DDD (degenerative disc disease), lumbar 11/12/2014  . Lumbar canal stenosis 11/12/2014  . Degenerative arthritis of lumbar spine with cord compression 11/12/2014  . Acid reflux 12/19/2013  . HLD (hyperlipidemia) 12/19/2013  . BP (high blood pressure) 12/19/2013  . Apnea, sleep 12/19/2013  . Diabetes mellitus, type 2 ([place]) 05/15/2013  . Adrenal adenoma 10/17/2012  . Intrathoracic  goiter 10/17/2012    Rosalyn Gess OTR/L ;CLT 11/30/2017, 6:18 PM  [facility] [address], [place], [address] Phone: [phone]   Fax:  [phone]  Name: Kelsey Young MRN: [id number] Date of Birth: Dec 09, 1931

## 2017-11-30 NOTE — Patient Instructions (Signed)
Cont with the MLD And wearing jovipak breast pad under bra or camisole to decrease thoracic lymphedema  To decrease L UE circumference  and cont with scar massage

## 2017-12-01 DIAGNOSIS — G4733 Obstructive sleep apnea (adult) (pediatric): Secondary | ICD-10-CM | POA: Diagnosis not present

## 2017-12-01 DIAGNOSIS — E782 Mixed hyperlipidemia: Secondary | ICD-10-CM | POA: Diagnosis not present

## 2017-12-01 DIAGNOSIS — R000 Tachycardia, unspecified: Secondary | ICD-10-CM | POA: Diagnosis not present

## 2017-12-01 DIAGNOSIS — R0602 Shortness of breath: Secondary | ICD-10-CM | POA: Diagnosis not present

## 2017-12-01 DIAGNOSIS — K219 Gastro-esophageal reflux disease without esophagitis: Secondary | ICD-10-CM | POA: Diagnosis not present

## 2017-12-01 DIAGNOSIS — I10 Essential (primary) hypertension: Secondary | ICD-10-CM | POA: Diagnosis not present

## 2017-12-22 DIAGNOSIS — R0602 Shortness of breath: Secondary | ICD-10-CM | POA: Diagnosis not present

## 2017-12-23 ENCOUNTER — Encounter: Payer: Self-pay | Admitting: Internal Medicine

## 2017-12-23 DIAGNOSIS — E782 Mixed hyperlipidemia: Secondary | ICD-10-CM | POA: Diagnosis not present

## 2017-12-23 DIAGNOSIS — E039 Hypothyroidism, unspecified: Secondary | ICD-10-CM | POA: Diagnosis not present

## 2017-12-23 DIAGNOSIS — E119 Type 2 diabetes mellitus without complications: Secondary | ICD-10-CM | POA: Diagnosis not present

## 2017-12-23 DIAGNOSIS — N390 Urinary tract infection, site not specified: Secondary | ICD-10-CM | POA: Diagnosis not present

## 2017-12-23 DIAGNOSIS — I10 Essential (primary) hypertension: Secondary | ICD-10-CM | POA: Diagnosis not present

## 2017-12-25 DIAGNOSIS — J449 Chronic obstructive pulmonary disease, unspecified: Secondary | ICD-10-CM | POA: Diagnosis not present

## 2017-12-25 DIAGNOSIS — Z96659 Presence of unspecified artificial knee joint: Secondary | ICD-10-CM | POA: Diagnosis not present

## 2017-12-28 DIAGNOSIS — E039 Hypothyroidism, unspecified: Secondary | ICD-10-CM | POA: Diagnosis not present

## 2017-12-28 DIAGNOSIS — E119 Type 2 diabetes mellitus without complications: Secondary | ICD-10-CM | POA: Diagnosis not present

## 2017-12-28 DIAGNOSIS — I10 Essential (primary) hypertension: Secondary | ICD-10-CM | POA: Diagnosis not present

## 2017-12-28 DIAGNOSIS — N390 Urinary tract infection, site not specified: Secondary | ICD-10-CM | POA: Diagnosis not present

## 2018-01-03 DIAGNOSIS — B356 Tinea cruris: Secondary | ICD-10-CM | POA: Diagnosis not present

## 2018-01-03 DIAGNOSIS — M25551 Pain in right hip: Secondary | ICD-10-CM | POA: Diagnosis not present

## 2018-01-03 DIAGNOSIS — N390 Urinary tract infection, site not specified: Secondary | ICD-10-CM | POA: Diagnosis not present

## 2018-01-03 DIAGNOSIS — M25552 Pain in left hip: Secondary | ICD-10-CM | POA: Diagnosis not present

## 2018-01-03 DIAGNOSIS — S30814A Abrasion of vagina and vulva, initial encounter: Secondary | ICD-10-CM | POA: Diagnosis not present

## 2018-01-03 DIAGNOSIS — E118 Type 2 diabetes mellitus with unspecified complications: Secondary | ICD-10-CM | POA: Diagnosis not present

## 2018-01-03 DIAGNOSIS — E782 Mixed hyperlipidemia: Secondary | ICD-10-CM | POA: Diagnosis not present

## 2018-01-04 DIAGNOSIS — I350 Nonrheumatic aortic (valve) stenosis: Secondary | ICD-10-CM | POA: Diagnosis not present

## 2018-01-04 DIAGNOSIS — G4733 Obstructive sleep apnea (adult) (pediatric): Secondary | ICD-10-CM | POA: Diagnosis not present

## 2018-01-04 DIAGNOSIS — E782 Mixed hyperlipidemia: Secondary | ICD-10-CM | POA: Diagnosis not present

## 2018-01-04 DIAGNOSIS — I10 Essential (primary) hypertension: Secondary | ICD-10-CM | POA: Diagnosis not present

## 2018-01-05 DIAGNOSIS — E782 Mixed hyperlipidemia: Secondary | ICD-10-CM | POA: Diagnosis not present

## 2018-01-12 ENCOUNTER — Ambulatory Visit: Payer: PPO | Admitting: Radiation Oncology

## 2018-01-12 DIAGNOSIS — Z08 Encounter for follow-up examination after completed treatment for malignant neoplasm: Secondary | ICD-10-CM | POA: Diagnosis not present

## 2018-01-12 DIAGNOSIS — Z872 Personal history of diseases of the skin and subcutaneous tissue: Secondary | ICD-10-CM | POA: Diagnosis not present

## 2018-01-12 DIAGNOSIS — X32XXXA Exposure to sunlight, initial encounter: Secondary | ICD-10-CM | POA: Diagnosis not present

## 2018-01-12 DIAGNOSIS — L821 Other seborrheic keratosis: Secondary | ICD-10-CM | POA: Diagnosis not present

## 2018-01-12 DIAGNOSIS — Z85828 Personal history of other malignant neoplasm of skin: Secondary | ICD-10-CM | POA: Diagnosis not present

## 2018-01-12 DIAGNOSIS — D485 Neoplasm of uncertain behavior of skin: Secondary | ICD-10-CM | POA: Diagnosis not present

## 2018-01-12 DIAGNOSIS — L570 Actinic keratosis: Secondary | ICD-10-CM | POA: Diagnosis not present

## 2018-01-12 DIAGNOSIS — L118 Other specified acantholytic disorders: Secondary | ICD-10-CM | POA: Diagnosis not present

## 2018-01-18 ENCOUNTER — Other Ambulatory Visit: Payer: Self-pay

## 2018-01-18 ENCOUNTER — Encounter: Payer: Self-pay | Admitting: Radiation Oncology

## 2018-01-18 ENCOUNTER — Ambulatory Visit
Admission: RE | Admit: 2018-01-18 | Discharge: 2018-01-18 | Disposition: A | Discharge status: Home / Self Care | Payer: PPO | Source: Ambulatory Visit | Attending: Radiation Oncology | Admitting: Radiation Oncology

## 2018-01-18 VITALS — BP 175/65 | HR 77 | Temp 97.5°F | Resp 20 | Wt 201.4 lb

## 2018-01-18 DIAGNOSIS — M79622 Pain in left upper arm: Secondary | ICD-10-CM | POA: Insufficient documentation

## 2018-01-18 DIAGNOSIS — C50412 Malignant neoplasm of upper-outer quadrant of left female breast: Secondary | ICD-10-CM

## 2018-01-18 DIAGNOSIS — Z923 Personal history of irradiation: Secondary | ICD-10-CM | POA: Insufficient documentation

## 2018-01-18 DIAGNOSIS — Z170 Estrogen receptor positive status [ER+]: Secondary | ICD-10-CM | POA: Diagnosis not present

## 2018-01-18 DIAGNOSIS — C50912 Malignant neoplasm of unspecified site of left female breast: Secondary | ICD-10-CM | POA: Insufficient documentation

## 2018-01-18 NOTE — Progress Notes (Signed)
Radiation Oncology Follow up Note  Name: Kelsey Young   Date:   01/18/2018 MRN:  [id number] DOB: [birth year]    This 84 y.o. female presents to the clinic today for 6 month follow-up status post whole breast radiation to her left breast for stage I ERpositive PR negative invasive mammary carcinoma  REFERRING PROVIDER: Perrin Maltese, MD  HPI: patient is an 84 year old female now out 6 months having completed radiation therapy to her left breast for stage I ER positive PR negative invasive mammary carcinoma status post wide local excision. Seen today in routine follow-up she still complains of some left axillary pain. She says she's been treated for lymphedema of her left upper extremity. I see no discernible evidence of lymphedema. She had no axillary radiation and she had only 3 sentinel lymph nodes removed..she's currently on Femara tolerate that well without side effect. Has not yet had a follow-up mammogram.  COMPLICATIONS OF TREATMENT: none  FOLLOW UP COMPLIANCE: keeps appointments   PHYSICAL EXAM:  BP (!) 175/65   Pulse 77   Temp (!) 97.5 F (36.4 C)   Resp 20   Wt 201 lb 6.2 oz (91.3 kg)   BMI 40.68 kg/m  Lungs are clear to A&P cardiac examination essentially unremarkable with regular rate and rhythm. No dominant mass or nodularity is noted in either breast in 2 positions examined. Incision is well-healed. No axillary or supraclavicular adenopathy is appreciated. Cosmetic result is excellent.no evidence of lymphedema is noted in her left upper extremity she does have extremely flabby arms bilaterally. Well-developed well-nourished patient in NAD. HEENT reveals PERLA, EOMI, discs not visualized.  Oral cavity is clear. No oral mucosal lesions are identified. Neck is clear without evidence of cervical or supraclavicular adenopathy. Lungs are clear to A&P. Cardiac examination is essentially unremarkable with regular rate and rhythm without murmur rub or thrill. Abdomen is benign with no  organomegaly or masses noted. Motor sensory and DTR levels are equal and symmetric in the upper and lower extremities. Cranial nerves II through XII are grossly intact. Proprioception is intact. No peripheral adenopathy or edema is identified. No motor or sensory levels are noted. Crude visual fields are within normal range.  RADIOLOGY RESULTS: no current films for review  PLAN: present time patient is doing well with no evidence of disease. I question her treatment for lymphedema she just seems to have flabby arms. Statistically with only 3 lymph nodes removed and no radiation this would be highly unusual to have any lymphedema in her upper extremities. She will talk to her therapist about that. Otherwise I've asked to see her back in 6 months for follow-up and then will start once your follow-up visits. She is seeing medical oncology and I've asked them to arrange for follow-up mammograms  I would like to take this opportunity to thank you for allowing me to participate in the care of your patient.Noreene Filbert, MD

## 2018-02-10 DIAGNOSIS — B372 Candidiasis of skin and nail: Secondary | ICD-10-CM | POA: Diagnosis not present

## 2018-03-03 DIAGNOSIS — E119 Type 2 diabetes mellitus without complications: Secondary | ICD-10-CM | POA: Diagnosis not present

## 2018-03-22 DIAGNOSIS — M4316 Spondylolisthesis, lumbar region: Secondary | ICD-10-CM | POA: Diagnosis not present

## 2018-03-22 DIAGNOSIS — M48062 Spinal stenosis, lumbar region with neurogenic claudication: Secondary | ICD-10-CM | POA: Diagnosis not present

## 2018-03-22 DIAGNOSIS — M5442 Lumbago with sciatica, left side: Secondary | ICD-10-CM | POA: Diagnosis not present

## 2018-03-22 DIAGNOSIS — M5416 Radiculopathy, lumbar region: Secondary | ICD-10-CM | POA: Diagnosis not present

## 2018-03-22 DIAGNOSIS — G8929 Other chronic pain: Secondary | ICD-10-CM | POA: Diagnosis not present

## 2018-03-22 DIAGNOSIS — M5136 Other intervertebral disc degeneration, lumbar region: Secondary | ICD-10-CM | POA: Diagnosis not present

## 2018-03-22 DIAGNOSIS — M5441 Lumbago with sciatica, right side: Secondary | ICD-10-CM | POA: Diagnosis not present

## 2018-03-23 ENCOUNTER — Other Ambulatory Visit: Payer: Self-pay | Admitting: Orthopedic Surgery

## 2018-03-23 DIAGNOSIS — Z872 Personal history of diseases of the skin and subcutaneous tissue: Secondary | ICD-10-CM | POA: Diagnosis not present

## 2018-03-23 DIAGNOSIS — D0471 Carcinoma in situ of skin of right lower limb, including hip: Secondary | ICD-10-CM | POA: Diagnosis not present

## 2018-03-23 DIAGNOSIS — M5136 Other intervertebral disc degeneration, lumbar region: Secondary | ICD-10-CM

## 2018-03-23 DIAGNOSIS — Z85828 Personal history of other malignant neoplasm of skin: Secondary | ICD-10-CM | POA: Diagnosis not present

## 2018-03-23 DIAGNOSIS — G8929 Other chronic pain: Secondary | ICD-10-CM

## 2018-03-23 DIAGNOSIS — L821 Other seborrheic keratosis: Secondary | ICD-10-CM | POA: Diagnosis not present

## 2018-03-23 DIAGNOSIS — Z08 Encounter for follow-up examination after completed treatment for malignant neoplasm: Secondary | ICD-10-CM | POA: Diagnosis not present

## 2018-03-23 DIAGNOSIS — M5442 Lumbago with sciatica, left side: Principal | ICD-10-CM

## 2018-03-23 DIAGNOSIS — M5441 Lumbago with sciatica, right side: Principal | ICD-10-CM

## 2018-03-24 ENCOUNTER — Other Ambulatory Visit: Payer: Self-pay | Admitting: Orthopedic Surgery

## 2018-03-24 DIAGNOSIS — M5442 Lumbago with sciatica, left side: Principal | ICD-10-CM

## 2018-03-24 DIAGNOSIS — M5136 Other intervertebral disc degeneration, lumbar region: Secondary | ICD-10-CM

## 2018-03-24 DIAGNOSIS — G8929 Other chronic pain: Secondary | ICD-10-CM

## 2018-03-24 DIAGNOSIS — M5441 Lumbago with sciatica, right side: Principal | ICD-10-CM

## 2018-04-08 ENCOUNTER — Ambulatory Visit
Admission: RE | Admit: 2018-04-08 | Discharge: 2018-04-08 | Disposition: A | Discharge status: Home / Self Care | Payer: PPO | Source: Ambulatory Visit | Attending: Orthopedic Surgery | Admitting: Orthopedic Surgery

## 2018-04-08 DIAGNOSIS — E118 Type 2 diabetes mellitus with unspecified complications: Secondary | ICD-10-CM | POA: Diagnosis not present

## 2018-04-08 DIAGNOSIS — M5442 Lumbago with sciatica, left side: Secondary | ICD-10-CM | POA: Diagnosis not present

## 2018-04-08 DIAGNOSIS — M545 Low back pain: Secondary | ICD-10-CM | POA: Diagnosis not present

## 2018-04-08 DIAGNOSIS — M5126 Other intervertebral disc displacement, lumbar region: Secondary | ICD-10-CM | POA: Insufficient documentation

## 2018-04-08 DIAGNOSIS — G8929 Other chronic pain: Secondary | ICD-10-CM | POA: Diagnosis not present

## 2018-04-08 DIAGNOSIS — M5136 Other intervertebral disc degeneration, lumbar region: Secondary | ICD-10-CM

## 2018-04-08 DIAGNOSIS — M48061 Spinal stenosis, lumbar region without neurogenic claudication: Secondary | ICD-10-CM | POA: Diagnosis not present

## 2018-04-08 DIAGNOSIS — M5441 Lumbago with sciatica, right side: Secondary | ICD-10-CM | POA: Diagnosis not present

## 2018-04-08 DIAGNOSIS — E119 Type 2 diabetes mellitus without complications: Secondary | ICD-10-CM | POA: Diagnosis not present

## 2018-04-08 DIAGNOSIS — E782 Mixed hyperlipidemia: Secondary | ICD-10-CM | POA: Diagnosis not present

## 2018-04-08 DIAGNOSIS — Z7689 Persons encountering health services in other specified circumstances: Secondary | ICD-10-CM | POA: Diagnosis not present

## 2018-04-08 DIAGNOSIS — I10 Essential (primary) hypertension: Secondary | ICD-10-CM | POA: Diagnosis not present

## 2018-04-08 DIAGNOSIS — M4316 Spondylolisthesis, lumbar region: Secondary | ICD-10-CM | POA: Insufficient documentation

## 2018-04-10 NOTE — Progress Notes (Signed)
[place]  Telephone:([phone] Fax:(336) [phone]  ID: ELLIYAH LISZEWSKI OB: December 22, 1931  MR#: [id number]  OZD#:[id number]  Patient Care Team: Perrin Maltese, MD as PCP - General (Internal Medicine)  CHIEF COMPLAINT: Pathologic stage Ia ER positive, PR/HER-2 negative invasive carcinoma of the upper outer quadrant of the left breast.  INTERVAL HISTORY: Patient returns to clinic today for routine six-month follow-up.  She continues to have multiple medical complaints that are chronic and unchanged.  She complains of intermittent left breast tenderness at the site of her surgery.  She also has persistent nausea that has resolved since discontinuing letrozole approximately 1 week ago.  She has no neurologic complaints. She denies any recent fevers or illnesses. She has a good appetite and denies weight loss. She has no chest pain, but complains of increased shortness of breath.  She denies any further nausea.  She has no vomiting, constipation, or diarrhea.  She has no urinary complaints.  Patient offers no further specific complaints today.    REVIEW OF SYSTEMS:   Review of Systems  Constitutional: Positive for malaise/fatigue. Negative for fever and weight loss.  HENT: Negative.  Negative for sore throat.   Respiratory: Positive for shortness of breath. Negative for cough.   Cardiovascular: Negative.  Negative for chest pain and leg swelling.  Gastrointestinal: Positive for heartburn and nausea. Negative for abdominal pain, blood in stool, constipation, diarrhea, melena and vomiting.  Genitourinary: Negative.  Negative for dysuria.  Musculoskeletal: Positive for back pain.  Skin: Negative.  Negative for rash.  Neurological: Negative.  Negative for sensory change, focal weakness and weakness.  Psychiatric/Behavioral: Negative.  The patient is not nervous/anxious.     As per HPI. Otherwise, a complete review of systems is negative.  PAST MEDICAL HISTORY: Past Medical  History:  Diagnosis Date  . Cancer ([place]) 03/2017   left breast  . Complication of anesthesia    has been slow to wake up  . COPD (chronic obstructive pulmonary disease) ([place])   . Diabetes mellitus without complication ([place])   . Dyspnea   . Edema leg 03/2017   bilateral swelling  . Family history of adverse reaction to anesthesia    son had surgery and heart stopped during procedure  . GERD (gastroesophageal reflux disease)   . Heart murmur    no treatment  . Hemorrhoids 03/2017  . History of kidney stones    had eswl  . Hyperlipidemia   . Hypertension   . Hypothyroidism   . Lumbar stenosis   . Skin cancer 2016,2017,2018   face, back of neck, shoulder, leg, hand  . Sleep apnea    does not and will not use a cpap  . Thyroid disease     PAST SURGICAL HISTORY: Past Surgical History:  Procedure Laterality Date  . ABDOMINAL HYSTERECTOMY  1982  . APPENDECTOMY  1940  . BREAST EXCISIONAL BIOPSY Left 04/30/2017   left breast lumpectomy +  . BREAST LUMPECTOMY WITH NEEDLE LOCALIZATION AND AXILLARY SENTINEL LYMPH NODE BX Left 04/30/2017   Procedure: LEFT BREAST LUMPECTOMY WITH NEEDLE LOCALIZATION AND LEFT AXILLARY SENTINEL LYMPH NODE BX;  Surgeon: Johnathan Hausen, MD;  Location: [facility] ORS;  Service: General;  Laterality: Left;  . CHOLECYSTECTOMY    . EYE SURGERY Bilateral 2003   cataract extraction  . implants  2003   dental, not removable  . JOINT REPLACEMENT Bilateral [id number]   total hip replacements  . JOINT REPLACEMENT Right 2014   right total knee replacement  .  SKIN CANCER EXCISION     left hand and right lower leg  . THYROIDECTOMY, PARTIAL  2003  . TONSILLECTOMY      FAMILY HISTORY: Family History  Problem Relation Age of Onset  . Heart failure Mother   . Heart attack Father   . Diabetes Brother   . Heart disease Brother   . Melanoma Brother   . Cancer Maternal Uncle   . Diabetes Paternal Aunt   . Heart disease Paternal Aunt   . Heart disease Paternal  Grandmother   . Diabetes Maternal Aunt   . Heart attack Maternal Aunt   . Diabetes Maternal Aunt   . Diabetes Paternal Aunt   . Heart disease Paternal Aunt   . Breast cancer Neg Hx     ADVANCED DIRECTIVES (Y/N):  N  HEALTH MAINTENANCE: Social History   Tobacco Use  . Smoking status: Former Smoker    Packs/day: 1.50    Types: Cigarettes    Last attempt to quit: 06/29/1985    Years since quitting: 32.8  . Smokeless tobacco: Never Used  Substance Use Topics  . Alcohol use: Yes    Alcohol/week: 1.0 - 2.0 standard drinks    Types: 1 - 2 Standard drinks or equivalent per week    Comment: wine 3 times a week  . Drug use: No     Colonoscopy:  PAP:  Bone density:  Lipid panel:  Allergies  Allergen Reactions  . Contrast Media [Iodinated Diagnostic Agents] Hives, Shortness Of Breath and Other (See Comments)    Restless. Difficulty breathing. Topical betadine okay   . Sulfur Hives and Shortness Of Breath  . Codeine Nausea And Vomiting    With high doses, passes out  . Morphine Nausea And Vomiting    With high doses, passes out  . Oxycodone Nausea And Vomiting and Other (See Comments)    Dizzy and with high doses, passes out.  Marland Kitchen Penicillins Other (See Comments)    "Hardened lump" Has patient had a PCN reaction causing immediate rash, facial/tongue/throat swelling, SOB or lightheadedness with hypotension: No Has patient had a PCN reaction causing severe rash involving mucus membranes or skin necrosis: No Has patient had a PCN reaction that required hospitalization No Has patient had a PCN reaction occurring within the last 10 years: No If all of the above answers are "NO", then may proceed with Cephalosporin use.    . Sulfa Antibiotics Rash    Current Outpatient Medications  Medication Sig Dispense Refill  . albuterol (PROVENTIL HFA;VENTOLIN HFA) 108 (90 Base) MCG/ACT inhaler Inhale 2 puffs into the lungs every 4 (four) hours as needed for wheezing or shortness of breath.  1 Inhaler 10  . aspirin EC 81 MG EC tablet Take 1 tablet (81 mg total) by mouth daily. 30 tablet 3  . atorvastatin (LIPITOR) 20 MG tablet Take by mouth.    . cephALEXin (KEFLEX) 500 MG capsule Take 1 capsule (500 mg total) by mouth 4 (four) times daily. 25 capsule 0  . Coenzyme Q10 (CO Q 10 PO) Take 100 mg by mouth daily.     . Fluticasone-Salmeterol (ADVAIR DISKUS) 250-50 MCG/DOSE AEPB Inhale 1 puff into the lungs 2 (two) times daily. 60 each 1  . ipratropium-albuterol (DUONEB) 0.5-2.5 (3) MG/3ML SOLN Take 3 mLs by nebulization every 4 (four) hours. 360 mL 12  . isosorbide mononitrate (IMDUR) 30 MG 24 hr tablet Take 1 tablet (30 mg total) by mouth daily. 30 tablet 3  . levothyroxine (SYNTHROID) 50  MCG tablet Take 1 tablet (50 mcg total) by mouth daily. 30 tablet 6  . losartan (COZAAR) 50 MG tablet Take 50 mg by mouth at bedtime.     . metFORMIN (GLUCOPHAGE) 500 MG tablet Take 500 mg by mouth 2 (two) times daily with a meal.     . metoprolol succinate (TOPROL-XL) 50 MG 24 hr tablet Take 50 mg by mouth at bedtime. Take with or immediately following a meal.    . Multiple Vitamins-Minerals (PRESERVISION AREDS 2) CAPS Take 1 capsule by mouth 2 (two) times daily.     . Naphazoline HCl (CLEAR EYES OP) Place 2 drops into both eyes as needed (for dry eyes).    . naproxen sodium (ANAPROX) 220 MG tablet Take 220-440 mg by mouth 2 (two) times daily as needed (for pain).    Marland Kitchen omeprazole (PRILOSEC) 20 MG capsule Take 20 mg by mouth daily as needed (for heartburn).     . pravastatin (PRAVACHOL) 40 MG tablet     . simvastatin (ZOCOR) 20 MG tablet Take 20 mg by mouth at bedtime.    . triamterene-hydrochlorothiazide (MAXZIDE-25) 37.5-25 MG per tablet Take 0.5 tablets by mouth daily.    Marland Kitchen diltiazem (CARDIZEM CD) 300 MG 24 hr capsule Take 1 capsule (300 mg total) by mouth daily. (Patient not taking: Reported on 04/14/2018) 7 capsule 0  . letrozole (FEMARA) 2.5 MG tablet Take 1 tablet (2.5 mg total) by mouth daily.  (Patient not taking: Reported on 04/14/2018) 30 tablet 3   No current facility-administered medications for this visit.     OBJECTIVE: Vitals:   04/14/18 1042 04/14/18 1057  BP:  (!) 168/100  Pulse:  96  Resp: (!) 24   Temp:  (!) 97.4 F (36.3 C)     Body mass index is 40.46 kg/m.    ECOG FS:0 - Asymptomatic  General: Well-developed, well-nourished, no acute distress. Eyes: Pink conjunctiva, anicteric sclera. HEENT: Normocephalic, moist mucous membranes. Breast: Left breast and axilla without lumps or masses. Lungs: Clear to auscultation bilaterally. Heart: Regular rate and rhythm. No rubs, murmurs, or gallops. Abdomen: Soft, nontender, nondistended. No organomegaly noted, normoactive bowel sounds. Musculoskeletal: No edema, cyanosis, or clubbing. Neuro: Alert, answering all questions appropriately. Cranial nerves grossly intact. Skin: No rashes or petechiae noted. Psych: Normal affect.  LAB RESULTS:  Lab Results  Component Value Date   NA 139 04/22/2017   K 4.2 04/22/2017   CL 101 04/22/2017   CO2 27 04/22/2017   GLUCOSE 105 (H) 04/22/2017   BUN 16 04/22/2017   CREATININE 0.79 04/22/2017   CALCIUM 10.3 04/22/2017   PROT 7.3 11/23/2016   ALBUMIN 4.0 11/23/2016   AST 26 11/23/2016   ALT 17 11/23/2016   ALKPHOS 82 11/23/2016   BILITOT 0.6 11/23/2016   GFRNONAA >60 04/22/2017   GFRAA >60 04/22/2017    Lab Results  Component Value Date   WBC 9.2 04/22/2017   NEUTROABS 4.9 12/04/2013   HGB 14.5 04/22/2017   HCT 44.2 04/22/2017   MCV 91.3 04/22/2017   PLT 310 04/22/2017     STUDIES: Mr Lumbar Spine Wo Contrast  Result Date: 04/08/2018 CLINICAL DATA:  Fifteen years of low back pain and RIGHT hip pain. Progressive worsening. EXAM: MRI LUMBAR SPINE WITHOUT CONTRAST TECHNIQUE: Multiplanar, multisequence MR imaging of the lumbar spine was performed. No intravenous contrast was administered. COMPARISON:  10/22/2014. FINDINGS: Segmentation:  Standard Alignment: 6  mm anterolisthesis L4-5. 3 mm retrolisthesis T12-L1. 3 mm anterolisthesis L5-S1. Vertebrae:  No worrisome  osseous lesion. Conus medullaris and cauda equina: Conus extends to the L1-L2 level. Conus and cauda equina appear normal. Paraspinal and other soft tissues: Unremarkable. Disc levels: L1-L2: Disc desiccation. Fairly good preservation of disc height. Annular bulge. No impingement. L2-L3: Disc desiccation. Annular bulge. Facet arthropathy. No impingement. L3-L4: Mild loss of disc height and signal. Annular bulge. Shallow foraminal protrusion to the LEFT. Posterior element hypertrophy. No definite impingement. L4-L5: Disc space narrowing. 6 mm anterolisthesis, facet mediated. Uncovering of the disc with central protrusion. Posterior element hypertrophy. Severe stenosis. BILATERAL L5 and L4 neural impingement. L5-S1: 3 mm anterolisthesis, facet mediated. Preserved disc height. Annular bulge. Posterior element hypertrophy. No definite compressive subarticular zone or foraminal zone narrowing. IMPRESSION: The dominant abnormality is at L4-5, where 6 mm anterolisthesis, central protrusion, and element hypertrophy result in severe stenosis with BILATERAL L4 and L5 neural impingement. Mild progression compared with 2016. Electronically Signed   By: Staci Righter M.D.   On: 04/08/2018 14:58    ASSESSMENT: Pathologic stage Ia ER positive, PR/HER-2 negative invasive carcinoma of the upper outer quadrant of the left breast.  PLAN:    1.  Pathologic stage Ia ER positive, PR/HER-2 negative invasive carcinoma of the upper outer quadrant of the left breast: Given the small size of patient's tumor and her advanced age, it was decided upon that chemotherapy was not necessary and Oncotype DX was not ordered.  Patient completed adjuvant XRT.  Continue letrozole for a total of 5 years completing in January 2024.  Patient plans to restart letrozole, but states that she becomes persistently nauseous again she will likely  discontinue.  Will consider anastrozole if needed.  Patient will require mammogram in the next 1 to 2 weeks.  Return to clinic in 6 months for routine evaluation. 2.  Bone health: Baseline bone marrow density on August 12, 2017 reported T score of -0.9 which is considered normal.  Repeat in February 2021.   3.  Abdominal discomfort: Patient does not complain of this today.   4.  Nausea: Unclear if related to letrozole, but patient states symptoms resolved upon discontinuing treatment.  Restart letrozole as above  I spent a total of 30 minutes face-to-face with the patient of which greater than 50% of the visit was spent in counseling and coordination of care as detailed above.    Patient expressed understanding and was in agreement with this plan. She also understands that She can call clinic at any time with any questions, concerns, or complaints.   Cancer Staging Primary cancer of upper outer quadrant of left female breast [facility]) Staging form: Breast, AJCC 8th Edition - Clinical stage from 04/17/2017: Stage IA (cT1a, cN0, cM0, G2, ER: Positive, PR: Negative, HER2: Negative) - Signed by Lloyd Huger, MD on 04/17/2017 - Pathologic stage from 05/11/2017: Stage IA (pT1b, pN0, cM0, G2, ER: Positive, PR: Negative, HER2: Negative) - Signed by Lloyd Huger, MD on 05/11/2017   Lloyd Huger, MD   04/16/2018 8:02 AM

## 2018-04-11 DIAGNOSIS — E782 Mixed hyperlipidemia: Secondary | ICD-10-CM | POA: Diagnosis not present

## 2018-04-11 DIAGNOSIS — E119 Type 2 diabetes mellitus without complications: Secondary | ICD-10-CM | POA: Diagnosis not present

## 2018-04-11 DIAGNOSIS — K219 Gastro-esophageal reflux disease without esophagitis: Secondary | ICD-10-CM | POA: Diagnosis not present

## 2018-04-11 DIAGNOSIS — Z23 Encounter for immunization: Secondary | ICD-10-CM | POA: Diagnosis not present

## 2018-04-11 DIAGNOSIS — I10 Essential (primary) hypertension: Secondary | ICD-10-CM | POA: Diagnosis not present

## 2018-04-11 DIAGNOSIS — E039 Hypothyroidism, unspecified: Secondary | ICD-10-CM | POA: Diagnosis not present

## 2018-04-12 ENCOUNTER — Telehealth: Payer: Self-pay | Admitting: *Deleted

## 2018-04-12 NOTE — Telephone Encounter (Signed)
Call returned to patient advised of doctor response

## 2018-04-12 NOTE — Telephone Encounter (Signed)
I guess it's with me.  I can order the mammogram when she is here.

## 2018-04-12 NOTE — Telephone Encounter (Signed)
Patient called asking who her appointment is with on the 17th and is asking if she needs to have a mammogram first. Her last mammogram was Oct 2018. Please advise

## 2018-04-14 ENCOUNTER — Encounter: Payer: Self-pay | Admitting: Oncology

## 2018-04-14 ENCOUNTER — Other Ambulatory Visit: Payer: Self-pay

## 2018-04-14 ENCOUNTER — Inpatient Hospital Stay: Payer: PPO | Attending: Oncology | Admitting: Oncology

## 2018-04-14 VITALS — BP 168/100 | HR 96 | Temp 97.4°F | Resp 24 | Ht 59.0 in | Wt 200.3 lb

## 2018-04-14 DIAGNOSIS — C50412 Malignant neoplasm of upper-outer quadrant of left female breast: Secondary | ICD-10-CM | POA: Diagnosis not present

## 2018-04-14 DIAGNOSIS — R0602 Shortness of breath: Secondary | ICD-10-CM

## 2018-04-14 DIAGNOSIS — Z87891 Personal history of nicotine dependence: Secondary | ICD-10-CM

## 2018-04-14 NOTE — Progress Notes (Signed)
Patient ;here for follow up. She reports intermittent pain under her left breast with movement and touch. She has stopped taking her letrozole secondary to side effects. She has had a recent scan for her spinal stenosis.

## 2018-04-25 DIAGNOSIS — I10 Essential (primary) hypertension: Secondary | ICD-10-CM | POA: Diagnosis not present

## 2018-04-25 DIAGNOSIS — R002 Palpitations: Secondary | ICD-10-CM | POA: Diagnosis not present

## 2018-04-25 DIAGNOSIS — E782 Mixed hyperlipidemia: Secondary | ICD-10-CM | POA: Diagnosis not present

## 2018-04-25 DIAGNOSIS — E1121 Type 2 diabetes mellitus with diabetic nephropathy: Secondary | ICD-10-CM | POA: Diagnosis not present

## 2018-05-03 ENCOUNTER — Ambulatory Visit
Admission: RE | Admit: 2018-05-03 | Discharge: 2018-05-03 | Disposition: A | Discharge status: Home / Self Care | Payer: PPO | Source: Ambulatory Visit | Attending: Oncology | Admitting: Oncology

## 2018-05-03 DIAGNOSIS — C50412 Malignant neoplasm of upper-outer quadrant of left female breast: Secondary | ICD-10-CM

## 2018-05-03 DIAGNOSIS — R928 Other abnormal and inconclusive findings on diagnostic imaging of breast: Secondary | ICD-10-CM | POA: Diagnosis not present

## 2018-05-03 DIAGNOSIS — Z853 Personal history of malignant neoplasm of breast: Secondary | ICD-10-CM | POA: Diagnosis not present

## 2018-05-03 HISTORY — DX: Personal history of irradiation: Z92.3

## 2018-05-09 DIAGNOSIS — E1121 Type 2 diabetes mellitus with diabetic nephropathy: Secondary | ICD-10-CM | POA: Diagnosis not present

## 2018-05-09 DIAGNOSIS — I10 Essential (primary) hypertension: Secondary | ICD-10-CM | POA: Diagnosis not present

## 2018-05-09 DIAGNOSIS — N390 Urinary tract infection, site not specified: Secondary | ICD-10-CM | POA: Diagnosis not present

## 2018-05-09 DIAGNOSIS — E782 Mixed hyperlipidemia: Secondary | ICD-10-CM | POA: Diagnosis not present

## 2018-05-17 DIAGNOSIS — M5136 Other intervertebral disc degeneration, lumbar region: Secondary | ICD-10-CM | POA: Diagnosis not present

## 2018-05-17 DIAGNOSIS — M5416 Radiculopathy, lumbar region: Secondary | ICD-10-CM | POA: Diagnosis not present

## 2018-06-13 DIAGNOSIS — I509 Heart failure, unspecified: Secondary | ICD-10-CM | POA: Diagnosis not present

## 2018-06-13 DIAGNOSIS — E039 Hypothyroidism, unspecified: Secondary | ICD-10-CM | POA: Diagnosis not present

## 2018-06-13 DIAGNOSIS — E1129 Type 2 diabetes mellitus with other diabetic kidney complication: Secondary | ICD-10-CM | POA: Diagnosis not present

## 2018-06-13 DIAGNOSIS — I10 Essential (primary) hypertension: Secondary | ICD-10-CM | POA: Diagnosis not present

## 2018-06-13 DIAGNOSIS — E782 Mixed hyperlipidemia: Secondary | ICD-10-CM | POA: Diagnosis not present

## 2018-06-13 DIAGNOSIS — Z23 Encounter for immunization: Secondary | ICD-10-CM | POA: Diagnosis not present

## 2018-06-17 DIAGNOSIS — M5416 Radiculopathy, lumbar region: Secondary | ICD-10-CM | POA: Diagnosis not present

## 2018-06-17 DIAGNOSIS — M5136 Other intervertebral disc degeneration, lumbar region: Secondary | ICD-10-CM | POA: Diagnosis not present

## 2018-06-17 DIAGNOSIS — M48062 Spinal stenosis, lumbar region with neurogenic claudication: Secondary | ICD-10-CM | POA: Diagnosis not present

## 2018-07-14 DIAGNOSIS — E039 Hypothyroidism, unspecified: Secondary | ICD-10-CM | POA: Diagnosis not present

## 2018-07-14 DIAGNOSIS — E782 Mixed hyperlipidemia: Secondary | ICD-10-CM | POA: Diagnosis not present

## 2018-07-14 DIAGNOSIS — E1129 Type 2 diabetes mellitus with other diabetic kidney complication: Secondary | ICD-10-CM | POA: Diagnosis not present

## 2018-07-14 DIAGNOSIS — I509 Heart failure, unspecified: Secondary | ICD-10-CM | POA: Diagnosis not present

## 2018-07-14 DIAGNOSIS — I10 Essential (primary) hypertension: Secondary | ICD-10-CM | POA: Diagnosis not present

## 2018-07-18 DIAGNOSIS — E782 Mixed hyperlipidemia: Secondary | ICD-10-CM | POA: Diagnosis not present

## 2018-07-18 DIAGNOSIS — I2510 Atherosclerotic heart disease of native coronary artery without angina pectoris: Secondary | ICD-10-CM | POA: Diagnosis not present

## 2018-07-18 DIAGNOSIS — E119 Type 2 diabetes mellitus without complications: Secondary | ICD-10-CM | POA: Diagnosis not present

## 2018-07-18 DIAGNOSIS — I10 Essential (primary) hypertension: Secondary | ICD-10-CM | POA: Diagnosis not present

## 2018-07-22 DIAGNOSIS — G4733 Obstructive sleep apnea (adult) (pediatric): Secondary | ICD-10-CM | POA: Diagnosis not present

## 2018-07-22 DIAGNOSIS — R002 Palpitations: Secondary | ICD-10-CM | POA: Diagnosis not present

## 2018-07-22 DIAGNOSIS — I350 Nonrheumatic aortic (valve) stenosis: Secondary | ICD-10-CM | POA: Diagnosis not present

## 2018-07-22 DIAGNOSIS — I10 Essential (primary) hypertension: Secondary | ICD-10-CM | POA: Diagnosis not present

## 2018-07-25 DIAGNOSIS — M48062 Spinal stenosis, lumbar region with neurogenic claudication: Secondary | ICD-10-CM | POA: Diagnosis not present

## 2018-07-25 DIAGNOSIS — M5416 Radiculopathy, lumbar region: Secondary | ICD-10-CM | POA: Diagnosis not present

## 2018-07-25 DIAGNOSIS — M5136 Other intervertebral disc degeneration, lumbar region: Secondary | ICD-10-CM | POA: Diagnosis not present

## 2018-07-25 DIAGNOSIS — M4726 Other spondylosis with radiculopathy, lumbar region: Secondary | ICD-10-CM | POA: Diagnosis not present

## 2018-08-05 DIAGNOSIS — K219 Gastro-esophageal reflux disease without esophagitis: Secondary | ICD-10-CM | POA: Diagnosis not present

## 2018-08-05 DIAGNOSIS — E010 Iodine-deficiency related diffuse (endemic) goiter: Secondary | ICD-10-CM | POA: Diagnosis not present

## 2018-08-05 DIAGNOSIS — G4733 Obstructive sleep apnea (adult) (pediatric): Secondary | ICD-10-CM | POA: Diagnosis not present

## 2018-08-05 DIAGNOSIS — E782 Mixed hyperlipidemia: Secondary | ICD-10-CM | POA: Diagnosis not present

## 2018-08-05 DIAGNOSIS — I350 Nonrheumatic aortic (valve) stenosis: Secondary | ICD-10-CM | POA: Diagnosis not present

## 2018-08-05 DIAGNOSIS — I10 Essential (primary) hypertension: Secondary | ICD-10-CM | POA: Diagnosis not present

## 2018-08-24 DIAGNOSIS — M47816 Spondylosis without myelopathy or radiculopathy, lumbar region: Secondary | ICD-10-CM | POA: Diagnosis not present

## 2018-08-30 DIAGNOSIS — M47816 Spondylosis without myelopathy or radiculopathy, lumbar region: Secondary | ICD-10-CM | POA: Diagnosis not present

## 2018-08-30 DIAGNOSIS — M5136 Other intervertebral disc degeneration, lumbar region: Secondary | ICD-10-CM | POA: Diagnosis not present

## 2018-08-30 DIAGNOSIS — M5416 Radiculopathy, lumbar region: Secondary | ICD-10-CM | POA: Diagnosis not present

## 2018-08-30 DIAGNOSIS — M48062 Spinal stenosis, lumbar region with neurogenic claudication: Secondary | ICD-10-CM | POA: Diagnosis not present

## 2018-09-21 DIAGNOSIS — M5136 Other intervertebral disc degeneration, lumbar region: Secondary | ICD-10-CM | POA: Diagnosis not present

## 2018-09-23 DIAGNOSIS — L309 Dermatitis, unspecified: Secondary | ICD-10-CM | POA: Diagnosis not present

## 2018-09-23 DIAGNOSIS — D485 Neoplasm of uncertain behavior of skin: Secondary | ICD-10-CM | POA: Diagnosis not present

## 2018-10-17 ENCOUNTER — Inpatient Hospital Stay: Payer: PPO | Attending: Oncology | Admitting: Oncology

## 2018-10-17 ENCOUNTER — Other Ambulatory Visit: Payer: Self-pay

## 2018-10-17 DIAGNOSIS — E119 Type 2 diabetes mellitus without complications: Secondary | ICD-10-CM

## 2018-10-17 DIAGNOSIS — C50412 Malignant neoplasm of upper-outer quadrant of left female breast: Secondary | ICD-10-CM

## 2018-10-17 DIAGNOSIS — E039 Hypothyroidism, unspecified: Secondary | ICD-10-CM | POA: Diagnosis not present

## 2018-10-17 DIAGNOSIS — Z170 Estrogen receptor positive status [ER+]: Secondary | ICD-10-CM

## 2018-10-17 DIAGNOSIS — I10 Essential (primary) hypertension: Secondary | ICD-10-CM

## 2018-10-17 DIAGNOSIS — Z79811 Long term (current) use of aromatase inhibitors: Secondary | ICD-10-CM | POA: Diagnosis not present

## 2018-10-17 DIAGNOSIS — Z79899 Other long term (current) drug therapy: Secondary | ICD-10-CM | POA: Diagnosis not present

## 2018-10-17 DIAGNOSIS — Z7982 Long term (current) use of aspirin: Secondary | ICD-10-CM | POA: Diagnosis not present

## 2018-10-17 DIAGNOSIS — Z87891 Personal history of nicotine dependence: Secondary | ICD-10-CM

## 2018-10-17 MED ORDER — ANASTROZOLE 1 MG PO TABS: 1.0000 mg | ORAL_TABLET | Freq: Every day | ORAL | 3 refills | Status: DC

## 2018-10-17 NOTE — Progress Notes (Signed)
Telephone visit today for follow up regarding breast cancer. Patient reports she has not taken letrozole since her last clinic visit, all symptoms have resolved. Patient reports tenderness to left breast and area under left breast and under arm, shoulder.

## 2018-10-17 NOTE — Progress Notes (Signed)
[place]  [phone] [phone]  ID: Kelsey Young OB: 1931-11-26  MR#: [id number]  INO#:[id number]  Patient Care Team: Perrin Maltese, MD as PCP - General (Internal Medicine)  I connected with Kelsey Young on 10/17/18 at 10:45 AM EDT by telephone visit and verified that I am speaking with the correct person using two identifiers.   I discussed the limitations, risks, security and privacy concerns of performing an evaluation and management service by telemedicine and the availability of in-person appointments. I also discussed with the patient that there may be a patient responsible charge related to this service. The patient expressed understanding and agreed to proceed.   Other persons participating in the visit and their role in the encounter: Patient, nursing.  Patient's location: Home Provider's location: Clinic  CHIEF COMPLAINT: Pathologic stage Ia ER positive, PR/HER-2 negative invasive carcinoma of the upper outer quadrant of the left breast.  INTERVAL HISTORY: Patient agreed to evaluation for her routine 28-monthfollow-up by telephone.  She continues to have multiple medical problems that are chronic and unchanged.  She continues to have intermittent left breast tenderness.  She discontinued letrozole 6 months ago and has not been on treatment since.  She has no neurologic complaints. She denies any recent fevers or illnesses. She has a good appetite and denies weight loss. She has no chest pain, but has chronic shortness of breath.  She denies any cough or hemoptysis.  She has no nausea, vomiting, constipation, or diarrhea.  She has no urinary complaints.  Patient offers no further specific complaints today.   REVIEW OF SYSTEMS:   Review of Systems  Constitutional: Positive for malaise/fatigue. Negative for fever and weight loss.  HENT: Negative.  Negative for sore throat.   Respiratory: Positive for shortness of breath. Negative for  cough.   Cardiovascular: Negative.  Negative for chest pain and leg swelling.  Gastrointestinal: Negative.  Negative for abdominal pain, blood in stool, constipation, diarrhea, heartburn, melena, nausea and vomiting.  Genitourinary: Negative.  Negative for dysuria.  Musculoskeletal: Positive for back pain.  Skin: Negative.  Negative for rash.  Neurological: Positive for weakness. Negative for dizziness, sensory change, focal weakness and headaches.  Psychiatric/Behavioral: Negative.  The patient is not nervous/anxious.     As per HPI. Otherwise, a complete review of systems is negative.  PAST MEDICAL HISTORY: Past Medical History:  Diagnosis Date  . Cancer ([facility] 03/2017   left breast  . Complication of anesthesia    has been slow to wake up  . COPD (chronic obstructive pulmonary disease) ([place]   . Diabetes mellitus without complication ([facility]   . Dyspnea   . Edema leg 03/2017   bilateral swelling  . Family history of adverse reaction to anesthesia    son had surgery and heart stopped during procedure  . GERD (gastroesophageal reflux disease)   . Heart murmur    no treatment  . Hemorrhoids 03/2017  . History of kidney stones    had eswl  . Hyperlipidemia   . Hypertension   . Hypothyroidism   . Lumbar stenosis   . Personal history of radiation therapy   . Skin cancer 2016,2017,2018   face, back of neck, shoulder, leg, hand  . Sleep apnea    does not and will not use a cpap  . Thyroid disease     PAST SURGICAL HISTORY: Past Surgical History:  Procedure Laterality Date  . ABDOMINAL HYSTERECTOMY  1982  . APPENDECTOMY  1940  .  BREAST BIOPSY Left 04/09/2017   [facility]  . BREAST LUMPECTOMY Left 04/30/2017   [facility], clear margins, negative LN  . BREAST LUMPECTOMY WITH NEEDLE LOCALIZATION AND AXILLARY SENTINEL LYMPH NODE BX Left 04/30/2017   Procedure: LEFT BREAST LUMPECTOMY WITH NEEDLE LOCALIZATION AND LEFT AXILLARY SENTINEL LYMPH NODE BX;  Surgeon: Johnathan Hausen, MD;  Location:  [facility] ORS;  Service: General;  Laterality: Left;  . CHOLECYSTECTOMY    . EYE SURGERY Bilateral 2003   cataract extraction  . implants  2003   dental, not removable  . JOINT REPLACEMENT Bilateral [id number]   total hip replacements  . JOINT REPLACEMENT Right 2014   right total knee replacement  . SKIN CANCER EXCISION     left hand and right lower leg  . THYROIDECTOMY, PARTIAL  2003  . TONSILLECTOMY      FAMILY HISTORY: Family History  Problem Relation Age of Onset  . Heart failure Mother   . Heart attack Father   . Diabetes Brother   . Heart disease Brother   . Melanoma Brother   . Cancer Maternal Uncle   . Diabetes Paternal Aunt   . Heart disease Paternal Aunt   . Heart disease Paternal Grandmother   . Diabetes Maternal Aunt   . Heart attack Maternal Aunt   . Diabetes Maternal Aunt   . Diabetes Paternal Aunt   . Heart disease Paternal Aunt   . Breast cancer Neg Hx     ADVANCED DIRECTIVES (Y/N):  N  HEALTH MAINTENANCE: Social History   Tobacco Use  . Smoking status: Former Smoker    Packs/day: 1.50    Types: Cigarettes    Last attempt to quit: 06/29/1985    Years since quitting: 33.3  . Smokeless tobacco: Never Used  Substance Use Topics  . Alcohol use: Yes    Alcohol/week: 1.0 - 2.0 standard drinks    Types: 1 - 2 Standard drinks or equivalent per week    Comment: wine 3 times a week  . Drug use: No     Colonoscopy:  PAP:  Bone density:  Lipid panel:  Allergies  Allergen Reactions  . Contrast Media [Iodinated Diagnostic Agents] Hives, Shortness Of Breath and Other (See Comments)    Restless. Difficulty breathing. Topical betadine okay   . Sulfur Hives and Shortness Of Breath  . Codeine Nausea And Vomiting    With high doses, passes out  . Morphine Nausea And Vomiting    With high doses, passes out  . Oxycodone Nausea And Vomiting and Other (See Comments)    Dizzy and with high doses, passes out.  Marland Kitchen Penicillins Other (See Comments)    "Hardened  lump" Has patient had a PCN reaction causing immediate rash, facial/tongue/throat swelling, SOB or lightheadedness with hypotension: No Has patient had a PCN reaction causing severe rash involving mucus membranes or skin necrosis: No Has patient had a PCN reaction that required hospitalization No Has patient had a PCN reaction occurring within the last 10 years: No If all of the above answers are "NO", then may proceed with Cephalosporin use.    . Sulfa Antibiotics Rash    Current Outpatient Medications  Medication Sig Dispense Refill  . albuterol (PROVENTIL HFA;VENTOLIN HFA) 108 (90 Base) MCG/ACT inhaler Inhale 2 puffs into the lungs every 4 (four) hours as needed for wheezing or shortness of breath. 1 Inhaler 10  . aspirin EC 81 MG EC tablet Take 1 tablet (81 mg total) by mouth daily. 30 tablet 3  . atorvastatin (  LIPITOR) 20 MG tablet Take by mouth.    . Coenzyme Q10 (CO Q 10 PO) Take 100 mg by mouth daily.     Marland Kitchen diltiazem (CARDIZEM CD) 300 MG 24 hr capsule Take 1 capsule (300 mg total) by mouth daily. 7 capsule 0  . losartan (COZAAR) 50 MG tablet Take 50 mg by mouth at bedtime.     . metFORMIN (GLUCOPHAGE) 500 MG tablet Take 500 mg by mouth 2 (two) times daily with a meal.     . Multiple Vitamins-Minerals (PRESERVISION AREDS 2) CAPS Take 1 capsule by mouth 2 (two) times daily.     . naproxen sodium (ANAPROX) 220 MG tablet Take 220-440 mg by mouth 2 (two) times daily as needed (for pain).    Marland Kitchen omeprazole (PRILOSEC) 20 MG capsule Take 20 mg by mouth daily as needed (for heartburn).     . triamterene-hydrochlorothiazide (MAXZIDE-25) 37.5-25 MG per tablet Take 0.5 tablets by mouth daily.    Marland Kitchen anastrozole (ARIMIDEX) 1 MG tablet Take 1 tablet (1 mg total) by mouth daily. 90 tablet 3  . levothyroxine (SYNTHROID) 50 MCG tablet Take 1 tablet (50 mcg total) by mouth daily. 30 tablet 6   No current facility-administered medications for this visit.     OBJECTIVE: There were no vitals filed for  this visit.   There is no height or weight on file to calculate BMI.    ECOG FS:0 - Asymptomatic   LAB RESULTS:  Lab Results  Component Value Date   NA 139 04/22/2017   K 4.2 04/22/2017   CL 101 04/22/2017   CO2 27 04/22/2017   GLUCOSE 105 (H) 04/22/2017   BUN 16 04/22/2017   CREATININE 0.79 04/22/2017   CALCIUM 10.3 04/22/2017   PROT 7.3 11/23/2016   ALBUMIN 4.0 11/23/2016   AST 26 11/23/2016   ALT 17 11/23/2016   ALKPHOS 82 11/23/2016   BILITOT 0.6 11/23/2016   GFRNONAA >60 04/22/2017   GFRAA >60 04/22/2017    Lab Results  Component Value Date   WBC 9.2 04/22/2017   NEUTROABS 4.9 12/04/2013   HGB 14.5 04/22/2017   HCT 44.2 04/22/2017   MCV 91.3 04/22/2017   PLT 310 04/22/2017     STUDIES: No results found.  ASSESSMENT: Pathologic stage Ia ER positive, PR/HER-2 negative invasive carcinoma of the upper outer quadrant of the left breast.  PLAN:    1.  Pathologic stage Ia ER positive, PR/HER-2 negative invasive carcinoma of the upper outer quadrant of the left breast: Given the small size of patient's tumor and her advanced age, it was decided upon that chemotherapy was not necessary and Oncotype DX was not ordered.  Patient completed adjuvant XRT.  She discontinued letrozole secondary to nausea approximately 6 months ago, but has now agreed to try anastrozole.  If patient continues with treatment, she will complete a total of 5 years of therapy in July 2024.  Her most recent mammogram on May 03, 2018 was reported as BI-RADS 2.  Repeat in November 2020.  Return to clinic in 6 months for routine evaluation.   2.  Bone health: Baseline bone marrow density on August 12, 2017 reported T score of -0.9 which is considered normal.  Repeat in February 2021.   3.  Nausea: Resolved since discontinuing letrozole.  Anastrozole as above. 4.  Breast pain: Chronic and unchanged.  Mammogram results as above.  I provided 25 minutes of non face-to-face telephone visit time during  this encounter, and > 50% was  spent counseling as documented under my assessment & plan.   Patient expressed understanding and was in agreement with this plan. She also understands that She can call clinic at any time with any questions, concerns, or complaints.   Cancer Staging Primary cancer of upper outer quadrant of left female breast [facility]) Staging form: Breast, AJCC 8th Edition - Clinical stage from 04/17/2017: Stage IA (cT1a, cN0, cM0, G2, ER: Positive, PR: Negative, HER2: Negative) - Signed by Lloyd Huger, MD on 04/17/2017 - Pathologic stage from 05/11/2017: Stage IA (pT1b, pN0, cM0, G2, ER: Positive, PR: Negative, HER2: Negative) - Signed by Lloyd Huger, MD on 05/11/2017   Lloyd Huger, MD   10/17/2018 2:11 PM

## 2018-10-27 DIAGNOSIS — D485 Neoplasm of uncertain behavior of skin: Secondary | ICD-10-CM | POA: Diagnosis not present

## 2018-10-27 DIAGNOSIS — C44629 Squamous cell carcinoma of skin of left upper limb, including shoulder: Secondary | ICD-10-CM | POA: Diagnosis not present

## 2018-11-01 DIAGNOSIS — I10 Essential (primary) hypertension: Secondary | ICD-10-CM | POA: Diagnosis not present

## 2018-11-02 DIAGNOSIS — C44629 Squamous cell carcinoma of skin of left upper limb, including shoulder: Secondary | ICD-10-CM | POA: Diagnosis not present

## 2018-12-01 DIAGNOSIS — I350 Nonrheumatic aortic (valve) stenosis: Secondary | ICD-10-CM | POA: Diagnosis not present

## 2019-01-18 ENCOUNTER — Other Ambulatory Visit: Payer: Self-pay

## 2019-01-18 ENCOUNTER — Encounter: Payer: Self-pay | Admitting: Radiation Oncology

## 2019-01-18 ENCOUNTER — Ambulatory Visit
Admission: RE | Admit: 2019-01-18 | Discharge: 2019-01-18 | Disposition: A | Discharge status: Home / Self Care | Payer: PPO | Source: Ambulatory Visit | Attending: Radiation Oncology | Admitting: Radiation Oncology

## 2019-01-18 VITALS — BP 159/75 | HR 86 | Temp 98.2°F | Resp 20 | Wt 193.8 lb

## 2019-01-18 DIAGNOSIS — C50412 Malignant neoplasm of upper-outer quadrant of left female breast: Secondary | ICD-10-CM

## 2019-01-18 DIAGNOSIS — M79622 Pain in left upper arm: Secondary | ICD-10-CM | POA: Diagnosis not present

## 2019-01-18 DIAGNOSIS — Z923 Personal history of irradiation: Secondary | ICD-10-CM | POA: Diagnosis not present

## 2019-01-18 DIAGNOSIS — N644 Mastodynia: Secondary | ICD-10-CM | POA: Diagnosis not present

## 2019-01-18 DIAGNOSIS — M25512 Pain in left shoulder: Secondary | ICD-10-CM | POA: Diagnosis not present

## 2019-01-18 DIAGNOSIS — Z170 Estrogen receptor positive status [ER+]: Secondary | ICD-10-CM | POA: Insufficient documentation

## 2019-01-18 DIAGNOSIS — R54 Age-related physical debility: Secondary | ICD-10-CM | POA: Insufficient documentation

## 2019-01-18 DIAGNOSIS — Z79811 Long term (current) use of aromatase inhibitors: Secondary | ICD-10-CM | POA: Diagnosis not present

## 2019-01-18 NOTE — Progress Notes (Signed)
Radiation Oncology Follow up Note  Name: Kelsey Young   Date:   01/18/2019 MRN:  [id number] DOB: 12-18-1931    This 85 y.o. female presents to the clinic today for 36-month follow-up status post whole breast radiation to her left breast for stage I ER PR positive invasive mammary carcinoma.  REFERRING PROVIDER: Perrin Maltese, MD  HPI: Patient is an 85 year old female now about 18 months having completed whole breast radiation to her left breast for stage I ER positive PR negative invasive mammary carcinoma status post wide local excision.  Seen today in routine follow-up she is doing fairly well she has multiple somatic complaints including left shoulder pain left axillary pain left breast pain all consistent with known scarring from prior surgery and radiation.  She specifically denies cough or any other areas of bone pain..  She had a mammogram back in November which was BI-RADS 2 benign which I have reviewed.  I also have reviewed her MRI scan from October showing L4-5 neural impingement secondary to central protrusion of her disc.  She is currently on arimadex.  COMPLICATIONS OF TREATMENT: none  FOLLOW UP COMPLIANCE: keeps appointments   PHYSICAL EXAM:  BP (!) 159/75   Pulse 86   Temp 98.2 F (36.8 C)   Resp 20   Wt 193 lb 12.6 oz (87.9 kg)   BMI 39.14 kg/m  Lungs are clear to A&P cardiac examination essentially unremarkable with regular rate and rhythm. No dominant mass or nodularity is noted in either breast in 2 positions examined. Incision is well-healed. No axillary or supraclavicular adenopathy is appreciated. Cosmetic result is excellent.  Well-developed well-nourished patient in NAD. HEENT reveals PERLA, EOMI, discs not visualized.  Oral cavity is clear. No oral mucosal lesions are identified. Neck is clear without evidence of cervical or supraclavicular adenopathy. Lungs are clear to A&P. Cardiac examination is essentially unremarkable with regular rate and rhythm without  murmur rub or thrill. Abdomen is benign with no organomegaly or masses noted. Motor sensory and DTR levels are equal and symmetric in the upper and lower extremities. Cranial nerves II through XII are grossly intact. Proprioception is intact. No peripheral adenopathy or edema is identified. No motor or sensory levels are noted. Crude visual fields are within normal range.  RADIOLOGY RESULTS: Mammograms and MRI of lumbar spine reviewed compatible with above-stated findings  PLAN: Present time patient is doing well with no evidence of disease.  I attribute most of her complaints to scarring from previous surgery and radiation of the left breast.  I have asked her to exercise her left upper extremity is much as possible.  She is quite frail and immobile at this time.  I have asked to see her back in 1 year for follow-up.  Patient knows to call with any concerns.  I would like to take this opportunity to thank you for allowing me to participate in the care of your patient.Noreene Filbert, MD

## 2019-02-09 DIAGNOSIS — I10 Essential (primary) hypertension: Secondary | ICD-10-CM | POA: Diagnosis not present

## 2019-03-09 DIAGNOSIS — L820 Inflamed seborrheic keratosis: Secondary | ICD-10-CM | POA: Diagnosis not present

## 2019-03-09 DIAGNOSIS — D225 Melanocytic nevi of trunk: Secondary | ICD-10-CM | POA: Diagnosis not present

## 2019-03-09 DIAGNOSIS — D2272 Melanocytic nevi of left lower limb, including hip: Secondary | ICD-10-CM | POA: Diagnosis not present

## 2019-03-09 DIAGNOSIS — D2271 Melanocytic nevi of right lower limb, including hip: Secondary | ICD-10-CM | POA: Diagnosis not present

## 2019-03-09 DIAGNOSIS — L309 Dermatitis, unspecified: Secondary | ICD-10-CM | POA: Diagnosis not present

## 2019-03-09 DIAGNOSIS — L821 Other seborrheic keratosis: Secondary | ICD-10-CM | POA: Diagnosis not present

## 2019-03-09 DIAGNOSIS — D2261 Melanocytic nevi of right upper limb, including shoulder: Secondary | ICD-10-CM | POA: Diagnosis not present

## 2019-03-09 DIAGNOSIS — D0471 Carcinoma in situ of skin of right lower limb, including hip: Secondary | ICD-10-CM | POA: Diagnosis not present

## 2019-03-09 DIAGNOSIS — L570 Actinic keratosis: Secondary | ICD-10-CM | POA: Diagnosis not present

## 2019-03-09 DIAGNOSIS — D2262 Melanocytic nevi of left upper limb, including shoulder: Secondary | ICD-10-CM | POA: Diagnosis not present

## 2019-03-09 DIAGNOSIS — L538 Other specified erythematous conditions: Secondary | ICD-10-CM | POA: Diagnosis not present

## 2019-04-11 ENCOUNTER — Telehealth: Payer: Self-pay | Admitting: *Deleted

## 2019-04-11 NOTE — Telephone Encounter (Signed)
When she sees [place] on 04/18/2019 he will place orders for mammo to be completed in November.

## 2019-04-11 NOTE — Telephone Encounter (Signed)
I returned call to patient and got her voice mail, left message that it can be scheduled when she sees Dr Grayland Ormond 10/20

## 2019-04-11 NOTE — Telephone Encounter (Signed)
Patient called stating that she needs to have a mammogram scheduled. He rlast one was 04/30/18

## 2019-04-12 ENCOUNTER — Telehealth: Payer: Self-pay | Admitting: *Deleted

## 2019-04-12 DIAGNOSIS — C50412 Malignant neoplasm of upper-outer quadrant of left female breast: Secondary | ICD-10-CM

## 2019-04-12 NOTE — Telephone Encounter (Signed)
That's fine

## 2019-04-12 NOTE — Telephone Encounter (Signed)
Patient called and would prefer to have mammogram before seeing Dr Grayland Ormond even if that means changing her follow up appointment to November. Please order mammogram and schedule it and then see doctor after mammogram results are available to him.

## 2019-04-13 DIAGNOSIS — L244 Irritant contact dermatitis due to drugs in contact with skin: Secondary | ICD-10-CM | POA: Diagnosis not present

## 2019-04-13 DIAGNOSIS — L308 Other specified dermatitis: Secondary | ICD-10-CM | POA: Diagnosis not present

## 2019-04-18 ENCOUNTER — Ambulatory Visit: Payer: PPO | Admitting: Oncology

## 2019-05-05 ENCOUNTER — Other Ambulatory Visit: Payer: Self-pay

## 2019-05-05 ENCOUNTER — Ambulatory Visit
Admission: RE | Admit: 2019-05-05 | Discharge: 2019-05-05 | Disposition: A | Discharge status: Home / Self Care | Payer: PPO | Source: Ambulatory Visit | Attending: Oncology | Admitting: Oncology

## 2019-05-05 DIAGNOSIS — R928 Other abnormal and inconclusive findings on diagnostic imaging of breast: Secondary | ICD-10-CM | POA: Diagnosis not present

## 2019-05-05 DIAGNOSIS — C50412 Malignant neoplasm of upper-outer quadrant of left female breast: Secondary | ICD-10-CM

## 2019-05-05 NOTE — Progress Notes (Signed)
Patient pre screened for office appointment, no questions or concerns today. 

## 2019-05-05 NOTE — Progress Notes (Signed)
Kelsey Young  [phone] [phone]) [phone]  ID: CARIDAD SILVEIRA OB: 02/21/1932  MR#: [id number]  [id number]#:[id number]  Patient Care Team: Perrin Maltese, MD as PCP - General (Internal Medicine)  CHIEF COMPLAINT: Pathologic stage Ia ER positive, PR/HER-2 negative invasive carcinoma of the upper outer quadrant of the left breast.  INTERVAL HISTORY: Patient returns to clinic today for routine 28-monthevaluation.  She is tolerating anastrozole well without significant side effects.  She has occasional breast tenderness and hot flashes, but these do not affect her day-to-day activity.  She continues to have multiple other medical complaints that are chronic and unchanged. She has no neurologic complaints. She denies any recent fevers or illnesses. She has a good appetite and denies weight loss. She has no chest pain, but has chronic shortness of breath.  She denies any cough or hemoptysis.  She has no nausea, vomiting, constipation, or diarrhea.  She has no urinary complaints.  Patient offers no further specific complaints today.  REVIEW OF SYSTEMS:   Review of Systems  Constitutional: Positive for malaise/fatigue. Negative for fever and weight loss.  HENT: Negative.  Negative for sore throat.   Respiratory: Positive for shortness of breath. Negative for cough.   Cardiovascular: Negative.  Negative for chest pain and leg swelling.  Gastrointestinal: Negative.  Negative for abdominal pain, blood in stool, constipation, diarrhea, heartburn, melena, nausea and vomiting.  Genitourinary: Negative.  Negative for dysuria.  Musculoskeletal: Positive for back pain.  Skin: Negative.  Negative for rash.  Neurological: Positive for sensory change and weakness. Negative for dizziness, focal weakness and headaches.  Psychiatric/Behavioral: Negative.  The patient is not nervous/anxious.     As per HPI. Otherwise, a complete review of systems is negative.  PAST MEDICAL  HISTORY: Past Medical History:  Diagnosis Date   [place]   left breast   Complication of anesthesia    has been slow to wake up   COPD [place] obstructive [place] disease) [place]    Diabetes mellitus without complication ([place]    Dyspnea    Edema leg 03/2017   bilateral swelling   Family history of adverse reaction to anesthesia    son had surgery and heart stopped during procedure   GERD (gastroesophageal reflux disease)    Heart murmur    no treatment   Hemorrhoids 03/2017   History of kidney stones    had eswl   Hyperlipidemia    Hypertension    Hypothyroidism    Lumbar stenosis    Personal history of radiation therapy 2018   LEFT lumpectomy   Skin cancer 2016,2017,2018   face, back of neck, shoulder, leg, hand   Sleep apnea    does not and will not use a cpap   Thyroid disease     PAST SURGICAL HISTORY: Past Surgical History:  Procedure Laterality Date   ABDOMINAL HYSTERECTOMY  1982   APPENDECTOMY  1940   [facility]  [place] [place] [place]   BREAST LUMPECTOMY WITH NEEDLE LOCALIZATION AND AXILLARY SENTINEL LYMPH NODE BX Left 04/30/2017   Procedure: LEFT BREAST LUMPECTOMY WITH NEEDLE LOCALIZATION AND LEFT AXILLARY SENTINEL LYMPH NODE BX;  Surgeon: MJohnathan Hausen MD;  Location: [facility] ORS;  Service: General;  Laterality: Left;   CHOLECYSTECTOMY     EYE SURGERY Bilateral 2003   cataract extraction   implants  2003   dental, not removable   JOINT REPLACEMENT Bilateral 2003,2012  total hip replacements   JOINT REPLACEMENT Right 2014   right total knee replacement   SKIN CANCER EXCISION     left hand and right lower leg   THYROIDECTOMY, PARTIAL  2003   TONSILLECTOMY      FAMILY HISTORY: Family History  Problem Relation Age of Onset   Heart failure Mother    Heart attack Father    Diabetes Brother    Heart disease Brother    Melanoma  Brother    Cancer Maternal Uncle    Diabetes Paternal Aunt    Heart disease Paternal Aunt    Heart disease Paternal Grandmother    Diabetes Maternal Aunt    Heart attack Maternal Aunt    Diabetes Maternal Aunt    Diabetes Paternal Aunt    Heart disease Paternal Aunt    Breast cancer Neg Hx     ADVANCED DIRECTIVES (Y/N):  N  HEALTH MAINTENANCE: Social History   Tobacco Use   Smoking status: Former Smoker    Packs/day: 1.50    Types: Cigarettes    Quit date: 06/29/1985    Years since quitting: 33.8   Smokeless tobacco: Never Used  Substance Use Topics   Alcohol use: Yes    Alcohol/week: 1.0 - 2.0 standard drinks    Types: 1 - 2 Standard drinks or equivalent per week    Comment: wine 3 times a week   Drug use: No     Colonoscopy:  PAP:  Bone density:  Lipid panel:  Allergies  Allergen Reactions   Contrast Media [Iodinated Diagnostic Agents] Hives, Shortness Of Breath and Other (See Comments)    Restless. Difficulty breathing. Topical betadine okay    Sulfur Hives and Shortness Of Breath   Codeine Nausea And Vomiting    With high doses, passes out   Morphine Nausea And Vomiting    With high doses, passes out   Oxycodone Nausea And Vomiting and Other (See Comments)    Dizzy and with high doses, passes out.   Penicillins Other (See Comments)    "Hardened lump" Has patient had a PCN reaction causing immediate rash, facial/tongue/throat swelling, SOB or lightheadedness with hypotension: No Has patient had a PCN reaction causing severe rash involving mucus membranes or skin necrosis: No Has patient had a PCN reaction that required hospitalization No Has patient had a PCN reaction occurring within the last 10 years: No If all of the above answers are "NO", then may proceed with Cephalosporin use.     Sulfa Antibiotics Rash    Current Outpatient Medications  Medication Sig Dispense Refill   albuterol (PROVENTIL HFA;VENTOLIN HFA) 108 (90 Base)  MCG/ACT inhaler Inhale 2 puffs into the lungs every 4 (four) hours as needed for wheezing or shortness of breath. 1 Inhaler 10   anastrozole (ARIMIDEX) 1 MG tablet Take 1 tablet (1 mg total) by mouth daily. 90 tablet 3   aspirin EC 81 MG EC tablet Take 1 tablet (81 mg total) by mouth daily. 30 tablet 3   atorvastatin (LIPITOR) 20 MG tablet Take by mouth.     Coenzyme Q10 (CO Q 10 PO) Take 100 mg by mouth daily.      diltiazem (CARDIZEM CD) 300 MG 24 hr capsule Take 1 capsule (300 mg total) by mouth daily. 7 capsule 0   levothyroxine (SYNTHROID) 50 MCG tablet Take 1 tablet (50 mcg total) by mouth daily. 30 tablet 6   losartan (COZAAR) 50 MG tablet Take 50 mg by mouth at bedtime.  metFORMIN (GLUCOPHAGE) 500 MG tablet Take 500 mg by mouth 2 (two) times daily with a meal.      Multiple Vitamins-Minerals (PRESERVISION AREDS 2) CAPS Take 1 capsule by mouth 2 (two) times daily.      naproxen sodium (ANAPROX) 220 MG tablet Take 220-440 mg by mouth 2 (two) times daily as needed (for pain).     omeprazole (PRILOSEC) 20 MG capsule Take 20 mg by mouth daily as needed (for heartburn).      triamterene-hydrochlorothiazide (MAXZIDE-25) 37.5-25 MG per tablet Take 0.5 tablets by mouth daily.     No current facility-administered medications for this visit.     OBJECTIVE: Vitals:   05/08/19 1103  BP: (!) 148/59  Pulse: 84  Resp: 18  Temp: 98 F (36.7 C)  SpO2: 94%     Body mass index is 38.84 kg/m.    ECOG FS:0 - Asymptomatic   General: Well-developed, well-nourished, no acute distress. Eyes: Pink conjunctiva, anicteric sclera. HEENT: Normocephalic, moist mucous membranes. Breast: Left breast and axilla mild tenderness to palpation without lumps or masses. Lungs: Clear to auscultation bilaterally. Heart: Regular rate and rhythm. No rubs, murmurs, or gallops. Abdomen: Soft, nontender, nondistended. No organomegaly noted, normoactive bowel sounds. Musculoskeletal: No edema, cyanosis,  or clubbing. Neuro: Alert, answering all questions appropriately. Cranial nerves grossly intact. Skin: No rashes or petechiae noted. Psych: Normal affect.  LAB RESULTS:  Lab Results  Component Value Date   NA 139 04/22/2017   K 4.2 04/22/2017   CL 101 04/22/2017   CO2 27 04/22/2017   GLUCOSE 105 (H) 04/22/2017   BUN 16 04/22/2017   CREATININE 0.79 04/22/2017   CALCIUM 10.3 04/22/2017   PROT 7.3 11/23/2016   ALBUMIN 4.0 11/23/2016   AST 26 11/23/2016   ALT 17 11/23/2016   ALKPHOS 82 11/23/2016   BILITOT 0.6 11/23/2016   GFRNONAA >60 04/22/2017   GFRAA >60 04/22/2017    Lab Results  Component Value Date   WBC 9.2 04/22/2017   NEUTROABS 4.9 12/04/2013   HGB 14.5 04/22/2017   HCT 44.2 04/22/2017   MCV 91.3 04/22/2017   PLT 310 04/22/2017     STUDIES: Mm Diag Breast Tomo Bilateral  Result Date: 05/05/2019 CLINICAL DATA:  85 year old patient with history of left breast cancer diagnosed in 2018. Presents for routine annual examination. She has chronic intermittent discomfort at the lumpectomy site. EXAM: DIGITAL DIAGNOSTIC BILATERAL MAMMOGRAM WITH CAD AND TOMO COMPARISON:  Previous exam(s). ACR Breast Density Category b: There are scattered areas of fibroglandular density. FINDINGS: Lumpectomy changes in the outer left breast. No mass, nonsurgical distortion, or suspicious microcalcification is identified in either breast to suggest malignancy. Mammographic images were processed with CAD. IMPRESSION: No evidence of malignancy in either breast. Lumpectomy changes on the left. RECOMMENDATION: Diagnostic mammogram is suggested in 1 year. (Code:[id number]-B-01Y) I have discussed the findings and recommendations with the patient. If applicable, a reminder letter will be sent to the patient regarding the next appointment. BI-RADS CATEGORY  2: Benign. Electronically Signed   By: Curlene Dolphin M.D.   On: 05/05/2019 10:47    ASSESSMENT: Pathologic stage Ia ER positive, PR/HER-2 negative invasive  carcinoma of the upper outer quadrant of the left breast.  PLAN:    1.  Pathologic stage Ia ER positive, PR/HER-2 negative invasive carcinoma of the upper outer quadrant of the left breast: Given the small size of patient's tumor and her advanced age, it was decided upon that chemotherapy was not necessary and Oncotype DX was  not ordered.  Patient completed adjuvant XRT.  Patient discontinued letrozole secondary to side effects, but now is tolerating anastrozole well.  She will complete a total of 5 years of treatment in July 2024.  Her most recent mammogram on May 05, 2019 was reported as BI-RADS 2.  Repeat in November 2021.  Return to clinic in 6 months for routine evaluation.  2.  Bone health: Baseline bone marrow density on August 12, 2017 reported T score of -0.9 which is considered normal.  Repeat in February 2021.   3.  Breast tenderness: Chronic and unchanged.  No evidence of recurrent disease. 4.  Hot flashes: Likely secondary to anastrozole.  Continue treatment as above.   Patient expressed understanding and was in agreement with this plan. She also understands that She can call clinic at any time with any questions, concerns, or complaints.   Cancer Staging Primary cancer of upper outer quadrant of left female breast [facility]) Staging form: Breast, AJCC 8th Edition - Clinical stage from 04/17/2017: Stage IA (cT1a, cN0, cM0, G2, ER: Positive, PR: Negative, HER2: Negative) - Signed by Lloyd Huger, MD on 04/17/2017 - Pathologic stage from 05/11/2017: Stage IA (pT1b, pN0, cM0, G2, ER: Positive, PR: Negative, HER2: Negative) - Signed by Lloyd Huger, MD on 05/11/2017   Lloyd Huger, MD   05/08/2019 1:04 PM

## 2019-05-08 ENCOUNTER — Other Ambulatory Visit: Payer: Self-pay

## 2019-05-08 ENCOUNTER — Inpatient Hospital Stay: Payer: PPO | Attending: Oncology | Admitting: Oncology

## 2019-05-08 VITALS — BP 148/59 | HR 84 | Temp 98.0°F | Resp 18 | Wt 192.3 lb

## 2019-05-08 DIAGNOSIS — Z87891 Personal history of nicotine dependence: Secondary | ICD-10-CM | POA: Diagnosis not present

## 2019-05-08 DIAGNOSIS — R0602 Shortness of breath: Secondary | ICD-10-CM | POA: Diagnosis not present

## 2019-05-08 DIAGNOSIS — N644 Mastodynia: Secondary | ICD-10-CM | POA: Diagnosis not present

## 2019-05-08 DIAGNOSIS — C50412 Malignant neoplasm of upper-outer quadrant of left female breast: Secondary | ICD-10-CM | POA: Insufficient documentation

## 2019-05-08 DIAGNOSIS — Z170 Estrogen receptor positive status [ER+]: Secondary | ICD-10-CM | POA: Diagnosis not present

## 2019-05-08 DIAGNOSIS — N951 Menopausal and female climacteric states: Secondary | ICD-10-CM | POA: Diagnosis not present

## 2019-05-24 DIAGNOSIS — H353131 Nonexudative age-related macular degeneration, bilateral, early dry stage: Secondary | ICD-10-CM | POA: Diagnosis not present

## 2019-05-24 DIAGNOSIS — E119 Type 2 diabetes mellitus without complications: Secondary | ICD-10-CM | POA: Diagnosis not present

## 2019-06-01 DIAGNOSIS — N390 Urinary tract infection, site not specified: Secondary | ICD-10-CM | POA: Diagnosis not present

## 2019-06-01 DIAGNOSIS — E559 Vitamin D deficiency, unspecified: Secondary | ICD-10-CM | POA: Diagnosis not present

## 2019-06-01 DIAGNOSIS — E1169 Type 2 diabetes mellitus with other specified complication: Secondary | ICD-10-CM | POA: Diagnosis not present

## 2019-06-01 DIAGNOSIS — R5383 Other fatigue: Secondary | ICD-10-CM | POA: Diagnosis not present

## 2019-06-01 DIAGNOSIS — R799 Abnormal finding of blood chemistry, unspecified: Secondary | ICD-10-CM | POA: Diagnosis not present

## 2019-06-01 DIAGNOSIS — D519 Vitamin B12 deficiency anemia, unspecified: Secondary | ICD-10-CM | POA: Diagnosis not present

## 2019-06-01 DIAGNOSIS — E782 Mixed hyperlipidemia: Secondary | ICD-10-CM | POA: Diagnosis not present

## 2019-06-01 DIAGNOSIS — I10 Essential (primary) hypertension: Secondary | ICD-10-CM | POA: Diagnosis not present

## 2019-06-01 DIAGNOSIS — L309 Dermatitis, unspecified: Secondary | ICD-10-CM | POA: Diagnosis not present

## 2019-06-01 DIAGNOSIS — E6609 Other obesity due to excess calories: Secondary | ICD-10-CM | POA: Diagnosis not present

## 2019-06-01 DIAGNOSIS — F411 Generalized anxiety disorder: Secondary | ICD-10-CM | POA: Diagnosis not present

## 2019-06-01 DIAGNOSIS — E1129 Type 2 diabetes mellitus with other diabetic kidney complication: Secondary | ICD-10-CM | POA: Diagnosis not present

## 2019-06-01 DIAGNOSIS — E039 Hypothyroidism, unspecified: Secondary | ICD-10-CM | POA: Diagnosis not present

## 2019-06-02 DIAGNOSIS — N390 Urinary tract infection, site not specified: Secondary | ICD-10-CM | POA: Diagnosis not present

## 2019-06-27 DIAGNOSIS — L578 Other skin changes due to chronic exposure to nonionizing radiation: Secondary | ICD-10-CM | POA: Diagnosis not present

## 2019-06-27 DIAGNOSIS — B354 Tinea corporis: Secondary | ICD-10-CM | POA: Diagnosis not present

## 2019-08-14 ENCOUNTER — Other Ambulatory Visit: Payer: Self-pay | Admitting: Oncology

## 2019-08-14 ENCOUNTER — Ambulatory Visit
Admission: RE | Admit: 2019-08-14 | Discharge: 2019-08-14 | Disposition: A | Discharge status: Home / Self Care | Payer: PPO | Source: Ambulatory Visit | Attending: Oncology | Admitting: Oncology

## 2019-08-14 DIAGNOSIS — C50412 Malignant neoplasm of upper-outer quadrant of left female breast: Secondary | ICD-10-CM

## 2019-08-14 DIAGNOSIS — M47816 Spondylosis without myelopathy or radiculopathy, lumbar region: Secondary | ICD-10-CM | POA: Diagnosis not present

## 2019-08-14 DIAGNOSIS — M419 Scoliosis, unspecified: Secondary | ICD-10-CM | POA: Diagnosis not present

## 2019-08-14 DIAGNOSIS — Z780 Asymptomatic menopausal state: Secondary | ICD-10-CM | POA: Insufficient documentation

## 2019-08-18 DIAGNOSIS — C44622 Squamous cell carcinoma of skin of right upper limb, including shoulder: Secondary | ICD-10-CM | POA: Diagnosis not present

## 2019-08-18 DIAGNOSIS — L570 Actinic keratosis: Secondary | ICD-10-CM | POA: Diagnosis not present

## 2019-08-18 DIAGNOSIS — D485 Neoplasm of uncertain behavior of skin: Secondary | ICD-10-CM | POA: Diagnosis not present

## 2019-08-18 DIAGNOSIS — X32XXXA Exposure to sunlight, initial encounter: Secondary | ICD-10-CM | POA: Diagnosis not present

## 2019-08-23 DIAGNOSIS — R531 Weakness: Secondary | ICD-10-CM | POA: Diagnosis not present

## 2019-08-23 DIAGNOSIS — I10 Essential (primary) hypertension: Secondary | ICD-10-CM | POA: Diagnosis not present

## 2019-08-23 DIAGNOSIS — G4733 Obstructive sleep apnea (adult) (pediatric): Secondary | ICD-10-CM | POA: Diagnosis not present

## 2019-08-23 DIAGNOSIS — E782 Mixed hyperlipidemia: Secondary | ICD-10-CM | POA: Diagnosis not present

## 2019-08-23 DIAGNOSIS — R110 Nausea: Secondary | ICD-10-CM | POA: Insufficient documentation

## 2019-08-23 DIAGNOSIS — E039 Hypothyroidism, unspecified: Secondary | ICD-10-CM | POA: Diagnosis not present

## 2019-08-23 DIAGNOSIS — I350 Nonrheumatic aortic (valve) stenosis: Secondary | ICD-10-CM | POA: Diagnosis not present

## 2019-08-23 HISTORY — DX: Nausea: R11.0

## 2019-08-31 DIAGNOSIS — F5222 Female sexual arousal disorder: Secondary | ICD-10-CM | POA: Diagnosis not present

## 2019-08-31 DIAGNOSIS — C44622 Squamous cell carcinoma of skin of right upper limb, including shoulder: Secondary | ICD-10-CM | POA: Diagnosis not present

## 2019-08-31 DIAGNOSIS — E1169 Type 2 diabetes mellitus with other specified complication: Secondary | ICD-10-CM | POA: Diagnosis not present

## 2019-08-31 DIAGNOSIS — C44222 Squamous cell carcinoma of skin of right ear and external auricular canal: Secondary | ICD-10-CM | POA: Diagnosis not present

## 2019-08-31 DIAGNOSIS — E1121 Type 2 diabetes mellitus with diabetic nephropathy: Secondary | ICD-10-CM | POA: Diagnosis not present

## 2019-08-31 DIAGNOSIS — R5383 Other fatigue: Secondary | ICD-10-CM | POA: Diagnosis not present

## 2019-08-31 DIAGNOSIS — E1142 Type 2 diabetes mellitus with diabetic polyneuropathy: Secondary | ICD-10-CM | POA: Diagnosis not present

## 2019-08-31 DIAGNOSIS — I10 Essential (primary) hypertension: Secondary | ICD-10-CM | POA: Diagnosis not present

## 2019-08-31 DIAGNOSIS — R61 Generalized hyperhidrosis: Secondary | ICD-10-CM | POA: Diagnosis not present

## 2019-08-31 DIAGNOSIS — Z20822 Contact with and (suspected) exposure to covid-19: Secondary | ICD-10-CM | POA: Diagnosis not present

## 2019-08-31 DIAGNOSIS — R0602 Shortness of breath: Secondary | ICD-10-CM | POA: Diagnosis not present

## 2019-08-31 DIAGNOSIS — E782 Mixed hyperlipidemia: Secondary | ICD-10-CM | POA: Diagnosis not present

## 2019-08-31 DIAGNOSIS — E559 Vitamin D deficiency, unspecified: Secondary | ICD-10-CM | POA: Diagnosis not present

## 2019-08-31 DIAGNOSIS — J449 Chronic obstructive pulmonary disease, unspecified: Secondary | ICD-10-CM | POA: Diagnosis not present

## 2019-08-31 DIAGNOSIS — E039 Hypothyroidism, unspecified: Secondary | ICD-10-CM | POA: Diagnosis not present

## 2019-09-11 ENCOUNTER — Telehealth: Payer: Self-pay | Admitting: *Deleted

## 2019-09-11 NOTE — Telephone Encounter (Signed)
Patient called asking for results of BMD and to ask if a copy was sent to her PCP Dr Humphrey Rolls.  EXAM: DUAL X-RAY ABSORPTIOMETRY (DXA) FOR BONE MINERAL DENSITY  IMPRESSION: Your patient Kelsey Young completed a BMD test on 08/14/2019 using the [place] (software version: 14.10) manufactured by [employer]. The following summarizes the results of our evaluation. Technologist: SCE PATIENT BIOGRAPHICAL: Name: Kelsey Young, Kelsey Young Patient ID: [id number] Birth Date: 1932-04-22 Height: 58.5 in. Gender: Female Exam Date: 08/14/2019 Weight: 190.5 lbs. Indications: Advanced Age, Bilateral Hip Replacements, Breast CA, Caucasian, Diabetic, Early Menopause, High Risk Meds, History of Fracture (Adult), History of Radiation, Hypothyroid, Hysterectomy, left knee replacement, Postmenopausal Fractures: Right elbow Treatments: Advair Inhaler, Albuterol, Anastrozole, Levothyroxine, Metformin, Omeprazole DENSITOMETRY RESULTS: Site         Region     Measured Date Measured Age WHO Classification Young Adult T-score BMD         %Change vs. Previous Significant Change (*) Left Forearm Radius 33% 08[age]14/2019 85.4 Normal -1.0 0.786 g/cm2 -1.5% - Left Forearm Radius 33% 08[age]12/2017 83.4 Normal -0.9 0.798 g/cm2 - - ASSESSMENT:  The BMD measured at Forearm Radius 33% is 0.786 g/cm2 with a T-score of -1.0. This patient is considered normal according to [facility]) criteria. The scan quality is good. Lumbar spine was not utilized due to advanced degenerative changes/scoliosis. Bilateral femurs were excluded due to surgical hardware. World [facility]) criteria for post-menopausal, Caucasian Women: Normal:       T-score at or above -1 SD Osteopenia:   T-score between -1 and -2.5 SD Osteoporosis: T-score at or below -2.5 SD  RECOMMENDATIONS:  1. All patients should optimize calcium and vitamin D intake. 2. Consider FDA-approved medical therapies in  postmenopausal women and men aged 31 years and older, based on the following: a. A hip or vertebral(clinical or morphometric) fracture b. T-score < -2.5 at the femoral neck or spine after appropriate evaluation to exclude secondary causes c. Low bone mass (T-score between -1.0 and -2.5 at the femoral neck or spine) and a 10-year probability of a hip fracture > 3% or a 10-year probability of a major osteoporosis-related fracture > 20% based on the [place]-adapted WHO algorithm 3. Clinician judgment and/or patient preferences may indicate treatment for people with 10-year fracture probabilities above or below these levels  FOLLOW-UP:  People with diagnosed cases of osteoporosis or at high risk for fracture should have regular bone mineral density tests. For patients eligible for Medicare, routine testing is allowed once every 2 years. The testing frequency can be increased to one year for patients who have rapidly progressing disease, those who are receiving or discontinuing medical therapy to restore bone mass, or have additional risk factors.  I have reviewed this report, and agree with the above findings.  [facility]   Electronically Signed   By: Lowella Grip III M.D.   On: 08/14/2019 11:39

## 2019-09-11 NOTE — Telephone Encounter (Signed)
Results normal and ok to forward

## 2019-09-11 NOTE — Telephone Encounter (Signed)
Done, Thanks.

## 2019-09-11 NOTE — Telephone Encounter (Signed)
Tillie Rung canyou please facx these results to Dr Humphrey Rolls since I do not have a fax available to me

## 2019-09-13 ENCOUNTER — Other Ambulatory Visit: Payer: Self-pay | Admitting: Oncology

## 2019-10-10 DIAGNOSIS — K2100 Gastro-esophageal reflux disease with esophagitis, without bleeding: Secondary | ICD-10-CM | POA: Diagnosis not present

## 2019-10-10 DIAGNOSIS — E782 Mixed hyperlipidemia: Secondary | ICD-10-CM | POA: Diagnosis not present

## 2019-10-10 DIAGNOSIS — I10 Essential (primary) hypertension: Secondary | ICD-10-CM | POA: Diagnosis not present

## 2019-10-10 DIAGNOSIS — E1169 Type 2 diabetes mellitus with other specified complication: Secondary | ICD-10-CM | POA: Diagnosis not present

## 2019-11-03 ENCOUNTER — Encounter: Payer: Self-pay | Admitting: Oncology

## 2019-11-03 NOTE — Progress Notes (Signed)
[place]  [phone] [phone]  ID: Brand Males OB: 1932-04-15  MR#: [id number]  FKC#:[id number]  Patient Care Team: Perrin Maltese, MD as PCP - General (Internal Medicine) Lloyd Huger, MD as Consulting Physician (Oncology)  CHIEF COMPLAINT: Pathologic stage Ia ER positive, PR/HER-2 negative invasive carcinoma of the upper outer quadrant of the left breast.  INTERVAL HISTORY: Patient returns to clinic today for routine 68-monthevaluation.  She has occasional hot flashes, but otherwise is tolerating anastrozole well.  She continues to have tenderness and occasional pain at the site of her previous surgery. She has no neurologic complaints. She denies any recent fevers or illnesses. She has a good appetite and denies weight loss. She has no chest pain, but has chronic shortness of breath.  She denies any cough or hemoptysis.  She has no nausea, vomiting, constipation, or diarrhea.  She has no urinary complaints.  Patient offers no further specific complaints today.  REVIEW OF SYSTEMS:   Review of Systems  Constitutional: Positive for malaise/fatigue. Negative for fever and weight loss.  HENT: Negative.  Negative for sore throat.   Respiratory: Negative for cough and shortness of breath.   Cardiovascular: Negative.  Negative for chest pain and leg swelling.  Gastrointestinal: Negative.  Negative for abdominal pain, blood in stool, constipation, diarrhea, heartburn, melena, nausea and vomiting.  Genitourinary: Negative.  Negative for dysuria.  Musculoskeletal: Positive for back pain.  Skin: Negative.  Negative for rash.  Neurological: Positive for sensory change and weakness. Negative for dizziness, focal weakness and headaches.  Psychiatric/Behavioral: Negative.  The patient is not nervous/anxious.     As per HPI. Otherwise, a complete review of systems is negative.  PAST MEDICAL HISTORY: Past Medical History:  Diagnosis Date  .  [place] 03[place]   left breast  . Complication of anesthesia    has been slow to wake up  . COPD (chronic obstructive pulmonary disease) ([place]   . Diabetes mellitus without complication ([place]   . Dyspnea   . Edema leg 03/2017   bilateral swelling  . Family history of adverse reaction to anesthesia    son had surgery and heart stopped during procedure  . GERD (gastroesophageal reflux disease)   . Heart murmur    no treatment  . Hemorrhoids 03/2017  . History of kidney stones    had eswl  . Hyperlipidemia   . Hypertension   . Hypothyroidism   . Lumbar stenosis   . Personal history of radiation therapy 2018   LEFT lumpectomy  . Skin cancer 2016,2017,2018   face, back of neck, shoulder, leg, hand  . Sleep apnea    does not and will not use a cpap  . Thyroid disease     PAST SURGICAL HISTORY: Past Surgical History:  Procedure Laterality Date  . ABDOMINAL HYSTERECTOMY  1982  . APPENDECTOMY  1940  . BREAST BIOPSY Left 04/09/2017   [facility]  . BREAST LUMPECTOMY Left 04/30/2017   [facility], clear margins, negative LN  . BREAST LUMPECTOMY WITH NEEDLE LOCALIZATION AND AXILLARY SENTINEL LYMPH NODE BX Left 04/30/2017   Procedure: LEFT BREAST LUMPECTOMY WITH NEEDLE LOCALIZATION AND LEFT AXILLARY SENTINEL LYMPH NODE BX;  Surgeon: MJohnathan Hausen MD;  Location: [facility] ORS;  Service: General;  Laterality: Left;  . CHOLECYSTECTOMY    . EYE SURGERY Bilateral 2003   cataract extraction  . implants  2003   dental, not removable  . JOINT REPLACEMENT Bilateral [id number]  total hip replacements  .  JOINT REPLACEMENT Right 2014   right total knee replacement  . SKIN CANCER EXCISION     left hand and right lower leg  . THYROIDECTOMY, PARTIAL  2003  . TONSILLECTOMY      FAMILY HISTORY: Family History  Problem Relation Age of Onset  . Heart failure Mother   . Heart attack Father   . Diabetes Brother   . Heart disease Brother   . Melanoma Brother   . Cancer Maternal Uncle   . Diabetes  Paternal Aunt   . Heart disease Paternal Aunt   . Heart disease Paternal Grandmother   . Diabetes Maternal Aunt   . Heart attack Maternal Aunt   . Diabetes Maternal Aunt   . Diabetes Paternal Aunt   . Heart disease Paternal Aunt   . Breast cancer Neg Hx     ADVANCED DIRECTIVES (Y/N):  N  HEALTH MAINTENANCE: Social History   Tobacco Use  . Smoking status: Former Smoker    Packs/day: 1.50    Types: Cigarettes    Quit date: 06/29/1985    Years since quitting: 34.3  . Smokeless tobacco: Never Used  Substance Use Topics  . Alcohol use: Yes    Alcohol/week: 1.0 - 2.0 standard drinks    Types: 1 - 2 Standard drinks or equivalent per week    Comment: wine 3 times a week  . Drug use: No     Colonoscopy:  PAP:  Bone density:  Lipid panel:  Allergies  Allergen Reactions  . Contrast Media [Iodinated Diagnostic Agents] Hives, Shortness Of Breath and Other (See Comments)    Restless. Difficulty breathing. Topical betadine okay   . Sulfur Hives and Shortness Of Breath  . Codeine Nausea And Vomiting    With high doses, passes out  . Morphine Nausea And Vomiting    With high doses, passes out  . Oxycodone Nausea And Vomiting and Other (See Comments)    Dizzy and with high doses, passes out.  Marland Kitchen Penicillins Other (See Comments)    "Hardened lump" Has patient had a PCN reaction causing immediate rash, facial/tongue/throat swelling, SOB or lightheadedness with hypotension: No Has patient had a PCN reaction causing severe rash involving mucus membranes or skin necrosis: No Has patient had a PCN reaction that required hospitalization No Has patient had a PCN reaction occurring within the last 10 years: No If all of the above answers are "NO", then may proceed with Cephalosporin use.    . Sulfa Antibiotics Rash    Current Outpatient Medications  Medication Sig Dispense Refill  . albuterol (PROVENTIL HFA;VENTOLIN HFA) 108 (90 Base) MCG/ACT inhaler Inhale 2 puffs into the lungs  every 4 (four) hours as needed for wheezing or shortness of breath. 1 Inhaler 10  . anastrozole (ARIMIDEX) 1 MG tablet Take 1 tablet by mouth daily 90 tablet 3  . aspirin EC 81 MG EC tablet Take 1 tablet (81 mg total) by mouth daily. 30 tablet 3  . atorvastatin (LIPITOR) 20 MG tablet Take by mouth.    . Coenzyme Q10 (CO Q 10 PO) Take 100 mg by mouth daily.     Marland Kitchen diltiazem (CARDIZEM CD) 300 MG 24 hr capsule Take 1 capsule (300 mg total) by mouth daily. 7 capsule 0  . levothyroxine (SYNTHROID) 50 MCG tablet Take 1 tablet (50 mcg total) by mouth daily. 30 tablet 6  . losartan (COZAAR) 50 MG tablet Take 50 mg by mouth at bedtime.     . metFORMIN (GLUCOPHAGE) 500 MG  tablet Take 500 mg by mouth 2 (two) times daily with a meal.     . Multiple Vitamins-Minerals (PRESERVISION AREDS 2) CAPS Take 1 capsule by mouth 2 (two) times daily.     . naproxen sodium (ANAPROX) 220 MG tablet Take 220-440 mg by mouth 2 (two) times daily as needed (for pain).    Marland Kitchen omeprazole (PRILOSEC) 20 MG capsule Take 20 mg by mouth daily as needed (for heartburn).     . triamterene-hydrochlorothiazide (MAXZIDE-25) 37.5-25 MG per tablet Take 0.5 tablets by mouth daily.     No current facility-administered medications for this visit.    OBJECTIVE: Vitals:   11/06/19 1105  BP: 131/63  Pulse: 89  Resp: 20  Temp: (!) 97.1 F (36.2 C)  SpO2: 97%     Body mass index is 38.5 kg/m.    ECOG FS:0 - Asymptomatic   General: Well-developed, well-nourished, no acute distress. Eyes: Pink conjunctiva, anicteric sclera. HEENT: Normocephalic, moist mucous membranes. Breast: Right breast without evidence of recurrence. Lungs: No audible wheezing or coughing. Heart: Regular rate and rhythm. Abdomen: Soft, nontender, no obvious distention. Musculoskeletal: No edema, cyanosis, or clubbing. Neuro: Alert, answering all questions appropriately. Cranial nerves grossly intact. Skin: No rashes or petechiae noted. Psych: Normal affect.  LAB  RESULTS:  Lab Results  Component Value Date   NA 139 04/22/2017   K 4.2 04/22/2017   CL 101 04/22/2017   CO2 27 04/22/2017   GLUCOSE 105 (H) 04/22/2017   BUN 16 04/22/2017   CREATININE 0.79 04/22/2017   CALCIUM 10.3 04/22/2017   PROT 7.3 11/23/2016   ALBUMIN 4.0 11/23/2016   AST 26 11/23/2016   ALT 17 11/23/2016   ALKPHOS 82 11/23/2016   BILITOT 0.6 11/23/2016   GFRNONAA >60 04/22/2017   GFRAA >60 04/22/2017    Lab Results  Component Value Date   WBC 9.2 04/22/2017   NEUTROABS 4.9 12/04/2013   HGB 14.5 04/22/2017   HCT 44.2 04/22/2017   MCV 91.3 04/22/2017   PLT 310 04/22/2017     STUDIES: No results found.  ASSESSMENT: Pathologic stage Ia ER positive, PR/HER-2 negative invasive carcinoma of the upper outer quadrant of the left breast.  PLAN:    1.  Pathologic stage Ia ER positive, PR/HER-2 negative invasive carcinoma of the upper outer quadrant of the left breast: Given the small size of patient's tumor and her advanced age, it was decided upon that chemotherapy was not necessary and Oncotype DX was not ordered.  Patient completed adjuvant XRT.  Patient discontinued letrozole secondary to side effects, but now is tolerating anastrozole well.  Continue anastrozole for a total of 5 years completing treatment in July 2024.  Her most recent mammogram on May 05, 2019 was reported as BI-RADS 2.  Repeat in November 2021.  Return to clinic in 6 months for routine evaluation.   2.  Bone health: Repeat bone mineral density on August 14, 2019 revealed a T score of -1.0 which is unchanged from previous.  Repeat in February 2022.   3.  Breast tenderness: Chronic and unchanged.  No evidence of recurrent disease. 4.  Hot flashes: Likely secondary to anastrozole.  Continue treatment as above.   Patient expressed understanding and was in agreement with this plan. She also understands that She can call clinic at any time with any questions, concerns, or complaints.   Cancer  Staging Primary cancer of upper outer quadrant of left female breast [facility]) Staging form: Breast, AJCC 8th Edition - Clinical stage  from 04/17/2017: Stage IA (cT1a, cN0, cM0, G2, ER: Positive, PR: Negative, HER2: Negative) - Signed by Lloyd Huger, MD on 04/17/2017 - Pathologic stage from 05/11/2017: Stage IA (pT1b, pN0, cM0, G2, ER: Positive, PR: Negative, HER2: Negative) - Signed by Lloyd Huger, MD on 05/11/2017   Lloyd Huger, MD   11/06/2019 1:53 PM

## 2019-11-03 NOTE — Progress Notes (Signed)
Patient states she is having some pain in her left  breast area. Patient states under her left axilla area has been very sore since she fell in march. She believes pain may be attributed to her fall. She would like to discuss further with provider.

## 2019-11-06 ENCOUNTER — Encounter: Payer: Self-pay | Admitting: Oncology

## 2019-11-06 ENCOUNTER — Other Ambulatory Visit: Payer: Self-pay

## 2019-11-06 ENCOUNTER — Inpatient Hospital Stay: Payer: PPO | Attending: Oncology | Admitting: Oncology

## 2019-11-06 VITALS — BP 131/63 | HR 89 | Temp 97.1°F | Resp 20 | Wt 190.6 lb

## 2019-11-06 DIAGNOSIS — C50412 Malignant neoplasm of upper-outer quadrant of left female breast: Secondary | ICD-10-CM | POA: Insufficient documentation

## 2019-11-06 DIAGNOSIS — N951 Menopausal and female climacteric states: Secondary | ICD-10-CM | POA: Insufficient documentation

## 2019-11-06 DIAGNOSIS — N644 Mastodynia: Secondary | ICD-10-CM | POA: Insufficient documentation

## 2019-11-06 DIAGNOSIS — R0602 Shortness of breath: Secondary | ICD-10-CM | POA: Insufficient documentation

## 2019-12-01 DIAGNOSIS — R5383 Other fatigue: Secondary | ICD-10-CM | POA: Diagnosis not present

## 2019-12-01 DIAGNOSIS — K2100 Gastro-esophageal reflux disease with esophagitis, without bleeding: Secondary | ICD-10-CM | POA: Diagnosis not present

## 2019-12-01 DIAGNOSIS — D519 Vitamin B12 deficiency anemia, unspecified: Secondary | ICD-10-CM | POA: Diagnosis not present

## 2019-12-01 DIAGNOSIS — E559 Vitamin D deficiency, unspecified: Secondary | ICD-10-CM | POA: Diagnosis not present

## 2019-12-01 DIAGNOSIS — E669 Obesity, unspecified: Secondary | ICD-10-CM | POA: Diagnosis not present

## 2019-12-01 DIAGNOSIS — E039 Hypothyroidism, unspecified: Secondary | ICD-10-CM | POA: Diagnosis not present

## 2019-12-01 DIAGNOSIS — E782 Mixed hyperlipidemia: Secondary | ICD-10-CM | POA: Diagnosis not present

## 2019-12-01 DIAGNOSIS — I10 Essential (primary) hypertension: Secondary | ICD-10-CM | POA: Diagnosis not present

## 2019-12-01 DIAGNOSIS — E119 Type 2 diabetes mellitus without complications: Secondary | ICD-10-CM | POA: Diagnosis not present

## 2019-12-01 DIAGNOSIS — E049 Nontoxic goiter, unspecified: Secondary | ICD-10-CM | POA: Diagnosis not present

## 2019-12-03 DIAGNOSIS — J449 Chronic obstructive pulmonary disease, unspecified: Secondary | ICD-10-CM | POA: Insufficient documentation

## 2019-12-04 DIAGNOSIS — E782 Mixed hyperlipidemia: Secondary | ICD-10-CM | POA: Diagnosis not present

## 2019-12-04 DIAGNOSIS — G4733 Obstructive sleep apnea (adult) (pediatric): Secondary | ICD-10-CM | POA: Diagnosis not present

## 2019-12-04 DIAGNOSIS — I350 Nonrheumatic aortic (valve) stenosis: Secondary | ICD-10-CM | POA: Diagnosis not present

## 2019-12-04 DIAGNOSIS — I351 Nonrheumatic aortic (valve) insufficiency: Secondary | ICD-10-CM | POA: Diagnosis not present

## 2019-12-04 DIAGNOSIS — I10 Essential (primary) hypertension: Secondary | ICD-10-CM | POA: Diagnosis not present

## 2019-12-05 DIAGNOSIS — I351 Nonrheumatic aortic (valve) insufficiency: Secondary | ICD-10-CM | POA: Insufficient documentation

## 2019-12-14 DIAGNOSIS — H6983 Other specified disorders of Eustachian tube, bilateral: Secondary | ICD-10-CM | POA: Diagnosis not present

## 2019-12-14 DIAGNOSIS — H903 Sensorineural hearing loss, bilateral: Secondary | ICD-10-CM | POA: Diagnosis not present

## 2019-12-14 DIAGNOSIS — H9313 Tinnitus, bilateral: Secondary | ICD-10-CM | POA: Diagnosis not present

## 2019-12-14 DIAGNOSIS — J301 Allergic rhinitis due to pollen: Secondary | ICD-10-CM | POA: Diagnosis not present

## 2020-01-23 DIAGNOSIS — K649 Unspecified hemorrhoids: Secondary | ICD-10-CM | POA: Diagnosis not present

## 2020-01-23 DIAGNOSIS — K5904 Chronic idiopathic constipation: Secondary | ICD-10-CM | POA: Diagnosis not present

## 2020-01-23 DIAGNOSIS — F5222 Female sexual arousal disorder: Secondary | ICD-10-CM | POA: Diagnosis not present

## 2020-01-23 DIAGNOSIS — E039 Hypothyroidism, unspecified: Secondary | ICD-10-CM | POA: Diagnosis not present

## 2020-01-23 DIAGNOSIS — E049 Nontoxic goiter, unspecified: Secondary | ICD-10-CM | POA: Diagnosis not present

## 2020-01-23 DIAGNOSIS — E119 Type 2 diabetes mellitus without complications: Secondary | ICD-10-CM | POA: Diagnosis not present

## 2020-01-26 DIAGNOSIS — K921 Melena: Secondary | ICD-10-CM | POA: Diagnosis not present

## 2020-01-31 ENCOUNTER — Other Ambulatory Visit: Payer: Self-pay

## 2020-01-31 ENCOUNTER — Encounter: Payer: Self-pay | Admitting: Radiation Oncology

## 2020-02-01 ENCOUNTER — Ambulatory Visit
Admission: RE | Admit: 2020-02-01 | Discharge: 2020-02-01 | Disposition: A | Discharge status: Home / Self Care | Payer: PPO | Source: Ambulatory Visit | Attending: Radiation Oncology | Admitting: Radiation Oncology

## 2020-02-01 DIAGNOSIS — C50412 Malignant neoplasm of upper-outer quadrant of left female breast: Secondary | ICD-10-CM

## 2020-02-02 ENCOUNTER — Telehealth: Payer: Self-pay | Admitting: Obstetrics & Gynecology

## 2020-02-02 NOTE — Telephone Encounter (Signed)
[facility] referring for sexual arousal disorder. Paper records. Called and left voicemail for patient to call back to be scheduled.

## 2020-02-26 ENCOUNTER — Ambulatory Visit (INDEPENDENT_AMBULATORY_CARE_PROVIDER_SITE_OTHER): Payer: PPO | Admitting: Obstetrics and Gynecology

## 2020-02-26 ENCOUNTER — Other Ambulatory Visit (HOSPITAL_COMMUNITY)
Admission: RE | Admit: 2020-02-26 | Discharge: 2020-02-26 | Disposition: A | Discharge status: Home / Self Care | Payer: PPO | Source: Ambulatory Visit | Attending: Obstetrics and Gynecology | Admitting: Obstetrics and Gynecology

## 2020-02-26 ENCOUNTER — Other Ambulatory Visit: Payer: Self-pay

## 2020-02-26 ENCOUNTER — Encounter: Payer: Self-pay | Admitting: Obstetrics and Gynecology

## 2020-02-26 VITALS — BP 128/74 | HR 84 | Resp 18 | Ht 59.0 in | Wt 182.0 lb

## 2020-02-26 DIAGNOSIS — A630 Anogenital (venereal) warts: Secondary | ICD-10-CM | POA: Diagnosis not present

## 2020-02-26 DIAGNOSIS — N9089 Other specified noninflammatory disorders of vulva and perineum: Secondary | ICD-10-CM | POA: Diagnosis not present

## 2020-02-26 DIAGNOSIS — R1900 Intra-abdominal and pelvic swelling, mass and lump, unspecified site: Secondary | ICD-10-CM | POA: Diagnosis not present

## 2020-02-26 DIAGNOSIS — B079 Viral wart, unspecified: Secondary | ICD-10-CM | POA: Diagnosis not present

## 2020-02-26 DIAGNOSIS — L821 Other seborrheic keratosis: Secondary | ICD-10-CM | POA: Diagnosis not present

## 2020-02-26 DIAGNOSIS — K5901 Slow transit constipation: Secondary | ICD-10-CM | POA: Diagnosis not present

## 2020-02-26 MED ORDER — BISACODYL 5 MG PO TBEC: 10.0000 mg | DELAYED_RELEASE_TABLET | Freq: Every day | ORAL | 11 refills | Status: DC

## 2020-02-26 NOTE — Patient Instructions (Signed)
Vulva Biopsy, Care After This sheet gives you information about how to care for yourself after your procedure. Your doctor may also give you more specific instructions. If you have problems or questions, contact your doctor. What can I expect after the procedure? After the procedure, it is common to have:  Slight bleeding from the biopsy site.  Soreness at the biopsy site. Follow these instructions at home: Biopsy site care   Follow instructions from your doctor about how to take care of your biopsy site. Make sure you: ? Clean the area using water and mild soap two times a day or as told by your doctor. Gently pat the area dry. ? If you were prescribed an antibiotic ointment, apply it as told by your doctor. Do not stop using the antibiotic even if your condition gets better. ? Take a warm water bath (sitz bath) as needed to help with pain. A sitz bath is taken while you are sitting down. The water should only come up to your hips and cover your butt. ? Leave stitches (sutures), skin glue, or skin tape (adhesive) strips in place. They may need to stay in place for 2 weeks or longer. If tape strips get loose and curl up, you may trim the loose edges. Do not remove tape strips completely unless your doctor says it is okay. ? Check your biopsy area every day for signs of infection. Check for:  More redness, swelling, or pain.  More fluid or blood.  Warmth.  Pus or a bad smell. ? Do not rub the biopsy area after peeing (urinating).  Gently pat the area dry, or use a bottle filled with warm water (peri-bottle) to clean the area.  Gently wipe from front to back. Lifestyle  Wear loose, cotton underwear.  Do not wear tight pants.  Do not use a tampon, douche, or put anything in your vagina for at least 1 week or until your doctor says it is okay.  Do not have sex for at least 1 week or until your doctor says it is okay.  Do not exercise until your doctor says it is okay.  Do not  swim or use a hot tub until your doctor says it is okay. You may shower or take a sitz bath. General instructions  Take over-the-counter and prescription medicines only as told by your doctor.  Use a sanitary pad until the bleeding stops.  Keep all follow-up visits as told by your doctor. This is important. Contact a doctor if:  You have more redness, swelling, or pain around your biopsy site.  You have more fluid or blood coming from your biopsy site.  Your biopsy site feels warm when you touch it.  Medicines do not help with your pain. Get help right away if:  You have a lot of bleeding from the vulva.  You have pus or a bad smell coming from your biopsy site.  You have a fever.  You have pain in the lower belly (abdomen). Summary  After the procedure, it is common to have slight bleeding and soreness at the biopsy site.  Follow all instructions as told by your doctor. Clean the area with water and mild soap. Do not rub. Pat the area dry.  Take sitz baths as needed. Leave any stitches in place.  Check your biopsy site for infection. Signs include more redness, swelling, pain, fluid, or blood, or feeling warm when you touch it.  Get help right away if you have a   lot of bleeding, a fever, pus or a bad smell, or pain in your lower belly. This information is not intended to replace advice given to you by your health care provider. Make sure you discuss any questions you have with your health care provider. Document Revised: 12/16/2017 Document Reviewed: 12/16/2017 Elsevier Patient Education  2020 [employer]  

## 2020-02-26 NOTE — Progress Notes (Signed)
Patient ID: Kelsey Young, female   DOB: May 26, 1932, 86 y.o.   MRN: [id number]  Reason for Consult: Gynecologic Exam   Referred by Mechele Claude, FNP  Subjective:     HPI:  Kelsey Young is a 86 y.o. female patient presents today with complaints of constipation.  She reports that the constipation has been going on since the end of June.  She reports that she is having stool impaction.  She reports that she was visiting with her family at the end of June in [place].  She had 4 days of coughing and then became constipated.  Her daughter recommended her taking MiraLAX.  She took this medication and then afterwards words was able to pass stool that was very hard like marbles.  She reports that since that time she has continued to have problems.  She has been taking MiraLAX 1-2 times a day but it does not help her have a daily bowel movement.  She has a bowel movement every 3 to 4 days.  She reports that her last bowel movement was the day before yesterday.  She reports that at the end of the July she noticed dark red rectal bleeding.  She reports that her last colonoscopy was 1 year ago and was normal.  She has an appointment to follow-up with her GI doctor in about a month.  She recently changed medications for her diabetes and a side effect of the new medication is constipation.  She also notes that she is having significant tightening of her hamstrings.  She reports that she has been told in the past she has a history of internal hemorrhoids.  In 2018 she had breast cancer on her left breast and underwent radiation for this.  She reports that she had a hysterectomy in her 28s secondary to abnormal uterine bleeding. Vulvar exam revealed verrucous lesion on patient's left vulva/perineum this lesion was biopsied.  Patient reports that she noticed this lesion about a week ago.   Past Medical History:  Diagnosis Date  . Cancer ([place]) 03/2017   left breast  . Complication of anesthesia      has been slow to wake up  . COPD (chronic obstructive pulmonary disease) ([place])   . Diabetes mellitus without complication ([place])   . Dyspnea   . Edema leg 03/2017   bilateral swelling  . Family history of adverse reaction to anesthesia    son had surgery and heart stopped during procedure  . GERD (gastroesophageal reflux disease)   . Heart murmur    no treatment  . Hemorrhoids 03/2017  . History of kidney stones    had eswl  . Hyperlipidemia   . Hypertension   . Hypothyroidism   . Lumbar stenosis   . Personal history of radiation therapy 2018   LEFT lumpectomy  . Skin cancer 2016,2017,2018   face, back of neck, shoulder, leg, hand  . Sleep apnea    does not and will not use a cpap  . Thyroid disease    Family History  Problem Relation Age of Onset  . Heart failure Mother   . Heart attack Father   . Diabetes Brother   . Heart disease Brother   . Melanoma Brother   . Cancer Maternal Uncle   . Diabetes Paternal Aunt   . Heart disease Paternal Aunt   . Heart disease Paternal Grandmother   . Diabetes Maternal Aunt   . Heart attack Maternal Aunt   . Diabetes  Maternal Aunt   . Diabetes Paternal Aunt   . Heart disease Paternal Aunt   . Breast cancer Neg Hx    Past Surgical History:  Procedure Laterality Date  . ABDOMINAL HYSTERECTOMY  1982  . APPENDECTOMY  1940  . BRE[facility]OPSY [facility]2017   [facility]  . BREAST LUMPECTOMY Left 04/30/2017   [facility], clear margins, negative LN  . BREAST LUMPECTOMY WITH NEEDLE LOCALIZATION AND AXILLARY SENTINEL LYMPH NODE BX Left 04/30/2017   Procedure: LEFT BREAST LUMPECTOMY WITH NEEDLE LOCALIZATION AND LEFT AXILLARY SENTINEL LYMPH NODE BX;  Surgeon: Johnathan Hausen, MD;  Location: [facility] ORS;  Service: General;  Laterality: Left;  . CHOLECYSTECTOMY    . EYE SURGERY Bilateral 2003   cataract extraction  . implants  2003   dental, not removable  . JOINT REPLACEMENT Bilateral [id number]   total hip replacements  . JOINT REPLACEMENT Right 2014    right total knee replacement  . SKIN CANCER EXCISION     left hand and right lower leg  . THYROIDECTOMY, PARTIAL  2003  . TONSILLECTOMY      Short Social History:  Social History   Tobacco Use  . Smoking status: Former Smoker    Packs/day: 1.50    Types: Cigarettes    Quit date: 06/29/1985    Years since quitting: 34.6  . Smokeless tobacco: Never Used  Substance Use Topics  . Alcohol use: Yes    Alcohol/week: 1.0 - 2.0 standard drink    Types: 1 - 2 Standard drinks or equivalent per week    Comment: wine 3 times a week    Allergies  Allergen Reactions  . Contrast Media [Iodinated Diagnostic Agents] Hives, Shortness Of Breath and Other (See Comments)    Restless. Difficulty breathing. Topical betadine okay   . Sulfur Hives and Shortness Of Breath  . Codeine Nausea And Vomiting    With high doses, passes out  . Morphine Nausea And Vomiting    With high doses, passes out  . Oxycodone Nausea And Vomiting and Other (See Comments)    Dizzy and with high doses, passes out.  Marland Kitchen Penicillins Other (See Comments)    "Hardened lump" Has patient had a PCN reaction causing immediate rash, facial/tongue/throat swelling, SOB or lightheadedness with hypotension: No Has patient had a PCN reaction causing severe rash involving mucus membranes or skin necrosis: No Has patient had a PCN reaction that required hospitalization No Has patient had a PCN reaction occurring within the last 10 years: No If all of the above answers are "NO", then may proceed with Cephalosporin use.    . Sulfa Antibiotics Rash    Current Outpatient Medications  Medication Sig Dispense Refill  . albuterol (PROVENTIL HFA;VENTOLIN HFA) 108 (90 Base) MCG/ACT inhaler Inhale 2 puffs into the lungs every 4 (four) hours as needed for wheezing or shortness of breath. 1 Inhaler 10  . anastrozole (ARIMIDEX) 1 MG tablet Take 1 tablet by mouth daily 90 tablet 3  . aspirin EC 81 MG EC tablet Take 1 tablet (81 mg total) by  mouth daily. 30 tablet 3  . atorvastatin (LIPITOR) 20 MG tablet Take by mouth.    . Coenzyme Q10 (CO Q 10 PO) Take 100 mg by mouth daily.     Marland Kitchen diltiazem (CARDIZEM CD) 300 MG 24 hr capsule Take 1 capsule (300 mg total) by mouth daily. 7 capsule 0  . FARXIGA 10 MG TABS tablet Take 1 tablet by mouth daily.    Marland Kitchen losartan (COZAAR)  50 MG tablet Take 50 mg by mouth at bedtime.     . metoprolol succinate (TOPROL-XL) 25 MG 24 hr tablet Take 1 tablet by mouth daily.    . Multiple Vitamins-Minerals (PRESERVISION AREDS 2) CAPS Take 1 capsule by mouth 2 (two) times daily.     . naproxen sodium (ANAPROX) 220 MG tablet Take 220-440 mg by mouth 2 (two) times daily as needed (for pain).    Marland Kitchen omeprazole (PRILOSEC) 20 MG capsule Take 20 mg by mouth daily as needed (for heartburn).     . RYBELSUS 7 MG TABS Take 1 tablet by mouth daily.    Marland Kitchen triamterene-hydrochlorothiazide (MAXZIDE-25) 37.5-25 MG per tablet Take 0.5 tablets by mouth daily.    Marland Kitchen levothyroxine (SYNTHROID) 50 MCG tablet Take 1 tablet (50 mcg total) by mouth daily. 30 tablet 6   No current facility-administered medications for this visit.    Review of Systems  Constitutional: Positive for fatigue. Negative for chills, fever and unexpected weight change.  HENT: Negative for trouble swallowing.  Eyes: Negative for loss of vision.  Respiratory: Positive for cough and shortness of breath. Negative for wheezing.  Cardiovascular: Negative for chest pain, leg swelling, palpitations and syncope.  GI: Positive for nausea. Negative for abdominal pain, blood in stool, diarrhea and vomiting.       + constipation GU: Positive for frequency. Negative for difficulty urinating, dysuria and hematuria.  Musculoskeletal: Positive for joint pain. Negative for back pain and leg pain.  Skin: Negative for rash.  Neurological: Positive for numbness. Negative for dizziness, headaches, light-headedness and seizures.  Psychiatric: Negative for behavioral problem,  confusion, depressed mood and sleep disturbance.       + anxiety       Objective:  Objective   Vitals:   02/26/20 1037  BP: 128/74  Pulse: 84  Resp: 18  SpO2: 97%  Weight: 182 lb (82.6 kg)  Height: 4\' 11"  (1.499 m)   Body mass index is 36.76 kg/m.  Physical Exam Vitals and nursing note reviewed.  Constitutional:      Appearance: She is well-developed.  HENT:     Head: Normocephalic and atraumatic.  Eyes:     Pupils: Pupils are equal, round, and reactive to light.  Cardiovascular:     Rate and Rhythm: Normal rate and regular rhythm.  Pulmonary:     Effort: Pulmonary effort is normal. No respiratory distress.  Genitourinary:      Comments: External: lesion on left perineum, groin. Normal vulva. Age related atrophy normal.   Speculum examination: Normal appearing vaginal cuff. No blood in the vaginal vault. No discharge.   Bimanual examination: Uterus surgically absent. pelvic fullness.    Skin:    General: Skin is warm and dry.  Neurological:     Mental Status: She is alert and oriented to person, place, and time.  Psychiatric:        Behavior: Behavior normal.        Thought Content: Thought content normal.        Judgment: Judgment normal.    VULVAR BIOPSY NOTE The indications for vulvar biopsy (rule out neoplasia, establish lichen sclerosus diagnosis) were reviewed.   Risks of the biopsy including pain, bleeding, infection, inadequate specimen, scarring and need for additional procedures  were discussed. The patient stated understanding and agreed to undergo procedure today. Consent was signed,  time out performed.   The patient's vulva was prepped with Betadine. 1% lidocaine was injected into area of concern. A 5 -mm  punch biopsy was done, biopsy tissue was picked up with sterile forceps and sterile scissors were used to excise the lesion.  Small bleeding was noted and hemostasis was achieved using silver nitrate sticks. 3-0 vicryl used to place two interrupted  stitches to obtain hemostasis.   The patient tolerated the procedure well. Post-procedure instructions  (pelvic rest for one week) were given to the patient. The patient is to call with heavy bleeding, fever greater than 100.4, foul smelling vaginal discharge or other concerns.        Assessment/Plan:     86 year old with chronic constipation 1.  Follow-up with GI advised, patient will keep appointment for early October.  Advised patient to start Dulcolax 10 mg once a day.  And to continue with MiraLAX.  Patient provided with provided with handout regarding constipation. 2.  Pelvic fullness-we will follow-up for pelvic ultrasound.  Low level of concern at this time for ovarian masses, however exam limited by body habitus. 3.  Vulvar lesion with verrucous appearance-biopsy today in office.  Will follow up to assess healing in 1 week.  Advised patient to keep area clean and shower daily.  Patient not able to soak in the tub but will use hand-held shower head to wash her bottom.  More than 30 minutes were spent face to face with the patient in the room, reviewing the medical record, labs and images, and coordinating care for the patient. The plan of management was discussed in detail and counseling was provided.    Adrian Prows MD [facility], [facility] 02/26/2020 11:07 AM

## 2020-02-27 ENCOUNTER — Encounter: Payer: Self-pay | Admitting: Obstetrics and Gynecology

## 2020-02-27 LAB — SURGICAL PATHOLOGY

## 2020-03-01 DIAGNOSIS — E119 Type 2 diabetes mellitus without complications: Secondary | ICD-10-CM | POA: Diagnosis not present

## 2020-03-01 DIAGNOSIS — E782 Mixed hyperlipidemia: Secondary | ICD-10-CM | POA: Diagnosis not present

## 2020-03-01 DIAGNOSIS — E039 Hypothyroidism, unspecified: Secondary | ICD-10-CM | POA: Diagnosis not present

## 2020-03-01 DIAGNOSIS — I10 Essential (primary) hypertension: Secondary | ICD-10-CM | POA: Diagnosis not present

## 2020-03-01 DIAGNOSIS — K5904 Chronic idiopathic constipation: Secondary | ICD-10-CM | POA: Diagnosis not present

## 2020-03-01 DIAGNOSIS — Z20822 Contact with and (suspected) exposure to covid-19: Secondary | ICD-10-CM | POA: Diagnosis not present

## 2020-03-12 ENCOUNTER — Ambulatory Visit (INDEPENDENT_AMBULATORY_CARE_PROVIDER_SITE_OTHER): Payer: PPO

## 2020-03-12 ENCOUNTER — Ambulatory Visit (INDEPENDENT_AMBULATORY_CARE_PROVIDER_SITE_OTHER): Payer: PPO | Admitting: Obstetrics and Gynecology

## 2020-03-12 ENCOUNTER — Other Ambulatory Visit: Payer: Self-pay

## 2020-03-12 ENCOUNTER — Other Ambulatory Visit: Payer: Self-pay | Admitting: Obstetrics and Gynecology

## 2020-03-12 ENCOUNTER — Encounter: Payer: Self-pay | Admitting: Obstetrics and Gynecology

## 2020-03-12 VITALS — BP 142/72 | HR 86 | Resp 18 | Ht 59.0 in | Wt 186.0 lb

## 2020-03-12 DIAGNOSIS — R1900 Intra-abdominal and pelvic swelling, mass and lump, unspecified site: Secondary | ICD-10-CM

## 2020-03-12 DIAGNOSIS — K5901 Slow transit constipation: Secondary | ICD-10-CM

## 2020-03-12 DIAGNOSIS — N9089 Other specified noninflammatory disorders of vulva and perineum: Secondary | ICD-10-CM | POA: Diagnosis not present

## 2020-03-12 DIAGNOSIS — R300 Dysuria: Secondary | ICD-10-CM | POA: Diagnosis not present

## 2020-03-12 NOTE — Progress Notes (Signed)
Patient ID: Kelsey Young, female   DOB: [birth year], 86 y.o.   MRN: [id number]  Reason for Consult: Gynecologic Exam ([place] )   Referred by Perrin Maltese, MD  Subjective:     HPI:  Kelsey Young is a 86 y.o. female. She is following up today for a pelvic US. She reports and improvement in her constipation. She is having normally formed stool. She has been using stool softeners. Biopsy is healing well.  Patient discontinued a diabetes medicationwhich also has improved her constipation.   Past Medical History:  Diagnosis Date  . Cancer ([place]) 03/2017   left breast  . Complication of anesthesia    has been slow to wake up  . COPD (chronic obstructive pulmonary disease) ([place])   . Diabetes mellitus without complication ([place])   . Dyspnea   . Edema leg 03/2017   bilateral swelling  . Family history of adverse reaction to anesthesia    son had surgery and heart stopped during procedure  . GERD (gastroesophageal reflux disease)   . Heart murmur    no treatment  . Hemorrhoids 03/2017  . History of kidney stones    had eswl  . Hyperlipidemia   . Hypertension   . Hypothyroidism   . Lumbar stenosis   . Personal history of radiation therapy 2018   LEFT lumpectomy  . Skin cancer 2016,2017,2018   face, back of neck, shoulder, leg, hand  . Sleep apnea    does not and will not use a cpap  . Thyroid disease    Family History  Problem Relation Age of Onset  . Heart failure Mother   . Heart attack Father   . Diabetes Brother   . Heart disease Brother   . Melanoma Brother   . Cancer Maternal Uncle   . Diabetes Paternal Aunt   . Heart disease Paternal Aunt   . Heart disease Paternal Grandmother   . Diabetes Maternal Aunt   . Heart attack Maternal Aunt   . Diabetes Maternal Aunt   . Diabetes Paternal Aunt   . Heart disease Paternal Aunt   . Breast cancer Neg Hx    Past Surgical History:  Procedure Laterality Date  . ABDOMINAL HYSTERECTOMY  1982  . APPENDECTOMY  1940  .  [facility]OPSY [facility]2017   [facility]  . BREAST LUMPECTOMY Left 04/30/2017   [facility], clear margins, negative LN  . BREAST LUMPECTOMY WITH NEEDLE LOCALIZATION AND AXILLARY SENTINEL LYMPH NODE BX Left 04/30/2017   Procedure: LEFT BREAST LUMPECTOMY WITH NEEDLE LOCALIZATION AND LEFT AXILLARY SENTINEL LYMPH NODE BX;  Surgeon: Johnathan Hausen, MD;  Location: [facility] ORS;  Service: General;  Laterality: Left;  . CHOLECYSTECTOMY    . EYE SURGERY Bilateral 2003   cataract extraction  . implants  2003   dental, not removable  . JOINT REPLACEMENT Bilateral [id number]   total hip replacements  . JOINT REPLACEMENT Right 2014   right total knee replacement  . SKIN CANCER EXCISION     left hand and right lower leg  . THYROIDECTOMY, PARTIAL  2003  . TONSILLECTOMY      Short Social History:  Social History   Tobacco Use  . Smoking status: Former Smoker    Packs/day: 1.50    Types: Cigarettes    Quit date: 06/29/1985    Years since quitting: 34.7  . Smokeless tobacco: Never Used  Substance Use Topics  . Alcohol use: Yes    Alcohol/week: 1.0 - 2.0 standard drink  Types: 1 - 2 Standard drinks or equivalent per week    Comment: wine 3 times a week    Allergies  Allergen Reactions  . Contrast Media [Iodinated Diagnostic Agents] Hives, Shortness Of Breath and Other (See Comments)    Restless. Difficulty breathing. Topical betadine okay   . Sulfur Hives and Shortness Of Breath  . Codeine Nausea And Vomiting    With high doses, passes out  . Morphine Nausea And Vomiting    With high doses, passes out  . Oxycodone Nausea And Vomiting and Other (See Comments)    Dizzy and with high doses, passes out.  Marland Kitchen Penicillins Other (See Comments)    "Hardened lump" Has patient had a PCN reaction causing immediate rash, facial/tongue/throat swelling, SOB or lightheadedness with hypotension: No Has patient had a PCN reaction causing severe rash involving mucus membranes or skin necrosis: No Has patient had a PCN  reaction that required hospitalization No Has patient had a PCN reaction occurring within the last 10 years: No If all of the above answers are "NO", then may proceed with Cephalosporin use.    . Sulfa Antibiotics Rash    Current Outpatient Medications  Medication Sig Dispense Refill  . albuterol (PROVENTIL HFA;VENTOLIN HFA) 108 (90 Base) MCG/ACT inhaler Inhale 2 puffs into the lungs every 4 (four) hours as needed for wheezing or shortness of breath. 1 Inhaler 10  . anastrozole (ARIMIDEX) 1 MG tablet Take 1 tablet by mouth daily 90 tablet 3  . aspirin EC 81 MG EC tablet Take 1 tablet (81 mg total) by mouth daily. 30 tablet 3  . atorvastatin (LIPITOR) 20 MG tablet Take by mouth.    . bisacodyl (DULCOLAX) 5 MG EC tablet Take 2 tablets (10 mg total) by mouth daily. 60 tablet 11  . Coenzyme Q10 (CO Q 10 PO) Take 100 mg by mouth daily.     Marland Kitchen diltiazem (CARDIZEM CD) 300 MG 24 hr capsule Take 1 capsule (300 mg total) by mouth daily. 7 capsule 0  . FARXIGA 10 MG TABS tablet Take 1 tablet by mouth daily.    Marland Kitchen losartan (COZAAR) 50 MG tablet Take 50 mg by mouth at bedtime.     . metoprolol succinate (TOPROL-XL) 25 MG 24 hr tablet Take 1 tablet by mouth daily.    . Multiple Vitamins-Minerals (PRESERVISION AREDS 2) CAPS Take 1 capsule by mouth 2 (two) times daily.     . naproxen sodium (ANAPROX) 220 MG tablet Take 220-440 mg by mouth 2 (two) times daily as needed (for pain).    Marland Kitchen omeprazole (PRILOSEC) 20 MG capsule Take 20 mg by mouth daily as needed (for heartburn).     . triamterene-hydrochlorothiazide (MAXZIDE-25) 37.5-25 MG per tablet Take 0.5 tablets by mouth daily.    Marland Kitchen levothyroxine (SYNTHROID) 50 MCG tablet Take 1 tablet (50 mcg total) by mouth daily. 30 tablet 6   No current facility-administered medications for this visit.    Review of Systems  Constitutional: Negative for chills, fatigue, fever and unexpected weight change.  HENT: Negative for trouble swallowing.  Eyes: Negative for  loss of vision.  Respiratory: Negative for cough, shortness of breath and wheezing.  Cardiovascular: Negative for chest pain, leg swelling, palpitations and syncope.  GI: Negative for abdominal pain, blood in stool, diarrhea, nausea and vomiting.  GU: Negative for difficulty urinating, dysuria, frequency and hematuria.  Musculoskeletal: Negative for back pain, leg pain and joint pain.  Skin: Negative for rash.  Neurological: Negative for dizziness, headaches,  light-headedness, numbness and seizures.  Psychiatric: Negative for behavioral problem, confusion, depressed mood and sleep disturbance.        Objective:  Objective   Vitals:   03/12/20 1504  BP: (!) 142/72  Pulse: 86  Resp: 18  SpO2: 96%  Weight: 186 lb (84.4 kg)  Height: 4\' 11"  (1.499 m)   Body mass index is 37.57 kg/m.  Physical Exam Vitals and nursing note reviewed.  Constitutional:      Appearance: She is well-developed.  HENT:     Head: Normocephalic and atraumatic.  Eyes:     Pupils: Pupils are equal, round, and reactive to light.  Cardiovascular:     Rate and Rhythm: Normal rate and regular rhythm.  Pulmonary:     Effort: Pulmonary effort is normal. No respiratory distress.  Genitourinary:      Comments: Much improved stool burden on single finger vaginal exam.  Skin:    General: Skin is warm and dry.  Neurological:     Mental Status: She is alert and oriented to person, place, and time.  Psychiatric:        Behavior: Behavior normal.        Thought Content: Thought content normal.        Judgment: Judgment normal.          Assessment/Plan:     86 yo   1. Constipation:  Improvement of stool burden on vaginal exam. Patient reports that she is now having regular bowel movement with help of colace and purealax. She is happy with that improvement. GI follow up is planned.   2. Vulvar biopsy normal. Healing well, continue care at home. Call with any concerns or signs of infection.    3.  Ovaries not seen on pelvic US today, likely atrophic and not of concern.   More than 25 minutes were spent face to face with the patient in the room, reviewing the medical record, labs and images, and coordinating care for the patient. The plan of management was discussed in detail and counseling was provided.    Follow up as needed.   Adrian Prows MD, [facility], [facility] 03/12/2020 4:34 PM

## 2020-03-13 DIAGNOSIS — R208 Other disturbances of skin sensation: Secondary | ICD-10-CM | POA: Diagnosis not present

## 2020-03-13 DIAGNOSIS — L538 Other specified erythematous conditions: Secondary | ICD-10-CM | POA: Diagnosis not present

## 2020-03-13 DIAGNOSIS — L853 Xerosis cutis: Secondary | ICD-10-CM | POA: Diagnosis not present

## 2020-03-13 DIAGNOSIS — R600 Localized edema: Secondary | ICD-10-CM | POA: Diagnosis not present

## 2020-03-13 DIAGNOSIS — L820 Inflamed seborrheic keratosis: Secondary | ICD-10-CM | POA: Diagnosis not present

## 2020-04-08 DIAGNOSIS — M5137 Other intervertebral disc degeneration, lumbosacral region: Secondary | ICD-10-CM | POA: Diagnosis not present

## 2020-04-08 DIAGNOSIS — E1169 Type 2 diabetes mellitus with other specified complication: Secondary | ICD-10-CM | POA: Diagnosis not present

## 2020-04-08 DIAGNOSIS — I10 Essential (primary) hypertension: Secondary | ICD-10-CM | POA: Diagnosis not present

## 2020-04-08 DIAGNOSIS — K649 Unspecified hemorrhoids: Secondary | ICD-10-CM | POA: Diagnosis not present

## 2020-04-08 DIAGNOSIS — M545 Low back pain, unspecified: Secondary | ICD-10-CM | POA: Diagnosis not present

## 2020-04-08 DIAGNOSIS — E782 Mixed hyperlipidemia: Secondary | ICD-10-CM | POA: Diagnosis not present

## 2020-04-08 DIAGNOSIS — K5904 Chronic idiopathic constipation: Secondary | ICD-10-CM | POA: Diagnosis not present

## 2020-04-09 DIAGNOSIS — E1169 Type 2 diabetes mellitus with other specified complication: Secondary | ICD-10-CM | POA: Diagnosis not present

## 2020-04-09 DIAGNOSIS — I10 Essential (primary) hypertension: Secondary | ICD-10-CM | POA: Diagnosis not present

## 2020-04-09 DIAGNOSIS — E782 Mixed hyperlipidemia: Secondary | ICD-10-CM | POA: Diagnosis not present

## 2020-04-09 DIAGNOSIS — M545 Low back pain, unspecified: Secondary | ICD-10-CM | POA: Diagnosis not present

## 2020-04-09 DIAGNOSIS — K649 Unspecified hemorrhoids: Secondary | ICD-10-CM | POA: Diagnosis not present

## 2020-04-22 ENCOUNTER — Telehealth: Payer: Self-pay | Admitting: *Deleted

## 2020-04-22 ENCOUNTER — Other Ambulatory Visit: Payer: Self-pay

## 2020-04-22 DIAGNOSIS — C50412 Malignant neoplasm of upper-outer quadrant of left female breast: Secondary | ICD-10-CM

## 2020-04-22 NOTE — Telephone Encounter (Signed)
Checked patient's chart for mammogram order, no recent order has been placed. Placed order for mammogram. Sent scheduling message to have done before next appointment with provider.

## 2020-04-22 NOTE — Telephone Encounter (Signed)
She is correct. Just need to mammo before her appt on 11/15.  Thanks!

## 2020-04-22 NOTE — Telephone Encounter (Signed)
Patient called reporting that her mammogram is due 11/6 and needs an order for it.

## 2020-04-30 DIAGNOSIS — I10 Essential (primary) hypertension: Secondary | ICD-10-CM | POA: Diagnosis not present

## 2020-04-30 DIAGNOSIS — Z6837 Body mass index (BMI) 37.0-37.9, adult: Secondary | ICD-10-CM | POA: Diagnosis not present

## 2020-04-30 DIAGNOSIS — M4316 Spondylolisthesis, lumbar region: Secondary | ICD-10-CM | POA: Diagnosis not present

## 2020-05-06 ENCOUNTER — Other Ambulatory Visit: Payer: Self-pay

## 2020-05-06 ENCOUNTER — Ambulatory Visit
Admission: RE | Admit: 2020-05-06 | Discharge: 2020-05-06 | Disposition: A | Discharge status: Home / Self Care | Payer: PPO | Source: Ambulatory Visit | Attending: Oncology | Admitting: Oncology

## 2020-05-06 DIAGNOSIS — C50412 Malignant neoplasm of upper-outer quadrant of left female breast: Secondary | ICD-10-CM | POA: Diagnosis not present

## 2020-05-06 DIAGNOSIS — R928 Other abnormal and inconclusive findings on diagnostic imaging of breast: Secondary | ICD-10-CM | POA: Diagnosis not present

## 2020-05-10 ENCOUNTER — Encounter: Payer: Self-pay | Admitting: Oncology

## 2020-05-11 NOTE — Progress Notes (Signed)
[place]  Telephone:(336) [phone] Fax:(336) [phone]  ID: Brand Males OB: 1932-05-28  MR#: [id number]  IRJ#:[id number]  Patient Care Team: Perrin Maltese, MD as PCP - General (Internal Medicine) Lloyd Huger, MD as Consulting Physician (Oncology)  CHIEF COMPLAINT: Pathologic stage Ia ER positive, PR/HER-2 negative invasive carcinoma of the upper outer quadrant of the left breast.  INTERVAL HISTORY: Patient returns to clinic today for routine 44-month evaluation.  She continues to tolerate anastrozole without significant side effects. She continues to have tenderness and occasional pain at the site of her previous surgery. She has no neurologic complaints. She denies any recent fevers or illnesses. She has a good appetite and denies weight loss. She has no chest pain, but has chronic shortness of breath.  She denies any cough or hemoptysis.  She has no nausea, vomiting, constipation, or diarrhea.  She has no urinary complaints.  Patient offers no further specific complaints today.    REVIEW OF SYSTEMS:   Review of Systems  Constitutional: Negative.  Negative for fever, malaise/fatigue and weight loss.  HENT: Negative.  Negative for sore throat.   Respiratory: Negative for cough and shortness of breath.   Cardiovascular: Negative.  Negative for chest pain and leg swelling.  Gastrointestinal: Negative.  Negative for abdominal pain, blood in stool, constipation, diarrhea, heartburn, melena, nausea and vomiting.  Genitourinary: Negative.  Negative for dysuria.  Musculoskeletal: Positive for back pain.  Skin: Negative.  Negative for rash.  Neurological: Negative.  Negative for dizziness, sensory change, focal weakness, weakness and headaches.  Psychiatric/Behavioral: Negative.  The patient is not nervous/anxious.     As per HPI. Otherwise, a complete review of systems is negative.  PAST MEDICAL HISTORY: Past Medical History:  Diagnosis Date  . Cancer ([place])  03/2017   left breast  . Complication of anesthesia    has been slow to wake up  . COPD (chronic obstructive pulmonary disease) ([place])   . Diabetes mellitus without complication ([place])   . Dyspnea   . Edema leg 03/2017   bilateral swelling  . Family history of adverse reaction to anesthesia    son had surgery and heart stopped during procedure  . GERD (gastroesophageal reflux disease)   . Heart murmur    no treatment  . Hemorrhoids 03/2017  . History of kidney stones    had eswl  . Hyperlipidemia   . Hypertension   . Hypothyroidism   . Lumbar stenosis   . Personal history of radiation therapy 2018   LEFT lumpectomy  . Skin cancer 2016,2017,2018   face, back of neck, shoulder, leg, hand  . Sleep apnea    does not and will not use a cpap  . Thyroid disease     PAST SURGICAL HISTORY: Past Surgical History:  Procedure Laterality Date  . ABDOMINAL HYSTERECTOMY  1982  . APPENDECTOMY  1940  . BRE[facility]SY [facility]2017   [facility]  . BREAST LUMPECTOMY Left 04/30/2017   [facility], clear margins, negative LN  . BREAST LUMPECTOMY WITH NEEDLE LOCALIZATION AND AXILLARY SENTINEL LYMPH NODE BX Left 04/30/2017   Procedure: LEFT BREAST LUMPECTOMY WITH NEEDLE LOCALIZATION AND LEFT AXILLARY SENTINEL LYMPH NODE BX;  Surgeon: Johnathan Hausen, MD;  Location: [facility] ORS;  Service: General;  Laterality: Left;  . CHOLECYSTECTOMY    . EYE SURGERY Bilateral 2003   cataract extraction  . implants  2003   dental, not removable  . JOINT REPLACEMENT Bilateral [id number]   total hip replacements  . JOINT  REPLACEMENT Right 2014   right total knee replacement  . SKIN CANCER EXCISION     left hand and right lower leg  . THYROIDECTOMY, PARTIAL  2003  . TONSILLECTOMY      FAMILY HISTORY: Family History  Problem Relation Age of Onset  . Heart failure Mother   . Heart attack Father   . Diabetes Brother   . Heart disease Brother   . Melanoma Brother   . Cancer Maternal Uncle   . Diabetes Paternal Aunt    . Heart disease Paternal Aunt   . Heart disease Paternal Grandmother   . Diabetes Maternal Aunt   . Heart attack Maternal Aunt   . Diabetes Maternal Aunt   . Diabetes Paternal Aunt   . Heart disease Paternal Aunt   . Breast cancer Neg Hx     ADVANCED DIRECTIVES (Y/N):  N  HEALTH MAINTENANCE: Social History   Tobacco Use  . Smoking status: Former Smoker    Packs/day: 1.50    Types: Cigarettes    Quit date: 06/29/1985    Years since quitting: 34.9  . Smokeless tobacco: Never Used  Vaping Use  . Vaping Use: Never used  Substance Use Topics  . Alcohol use: Yes    Alcohol/week: 1.0 - 2.0 standard drink    Types: 1 - 2 Standard drinks or equivalent per week    Comment: wine 3 times a week  . Drug use: No     Colonoscopy:  PAP:  Bone density:  Lipid panel:  Allergies  Allergen Reactions  . Contrast Media [Iodinated Diagnostic Agents] Hives, Shortness Of Breath and Other (See Comments)    Restless. Difficulty breathing. Topical betadine okay   . Sulfur Hives and Shortness Of Breath  . Codeine Nausea And Vomiting    With high doses, passes out  . Morphine Nausea And Vomiting    With high doses, passes out  . Oxycodone Nausea And Vomiting and Other (See Comments)    Dizzy and with high doses, passes out.  Marland Kitchen Penicillins Other (See Comments)    "Hardened lump" Has patient had a PCN reaction causing immediate rash, facial/tongue/throat swelling, SOB or lightheadedness with hypotension: No Has patient had a PCN reaction causing severe rash involving mucus membranes or skin necrosis: No Has patient had a PCN reaction that required hospitalization No Has patient had a PCN reaction occurring within the last 10 years: No If all of the above answers are "NO", then may proceed with Cephalosporin use.    . Sulfa Antibiotics Rash    Current Outpatient Medications  Medication Sig Dispense Refill  . albuterol (PROVENTIL HFA;VENTOLIN HFA) 108 (90 Base) MCG/ACT inhaler Inhale 2  puffs into the lungs every 4 (four) hours as needed for wheezing or shortness of breath. 1 Inhaler 10  . anastrozole (ARIMIDEX) 1 MG tablet Take 1 tablet by mouth daily 90 tablet 3  . aspirin EC 81 MG EC tablet Take 1 tablet (81 mg total) by mouth daily. 30 tablet 3  . atorvastatin (LIPITOR) 20 MG tablet Take by mouth.    . bisacodyl (DULCOLAX) 5 MG EC tablet Take 2 tablets (10 mg total) by mouth daily. 60 tablet 11  . Coenzyme Q10 (CO Q 10 PO) Take 100 mg by mouth daily.     Marland Kitchen diltiazem (CARDIZEM CD) 300 MG 24 hr capsule Take 1 capsule (300 mg total) by mouth daily. 7 capsule 0  . FARXIGA 10 MG TABS tablet Take 1 tablet by mouth daily.    Marland Kitchen  levothyroxine (SYNTHROID) 50 MCG tablet Take 1 tablet (50 mcg total) by mouth daily. 30 tablet 6  . losartan (COZAAR) 50 MG tablet Take 50 mg by mouth at bedtime.     . metoprolol succinate (TOPROL-XL) 25 MG 24 hr tablet Take 1 tablet by mouth daily.    . Multiple Vitamins-Minerals (PRESERVISION AREDS 2) CAPS Take 1 capsule by mouth 2 (two) times daily.     . naproxen sodium (ANAPROX) 220 MG tablet Take 220-440 mg by mouth 2 (two) times daily as needed (for pain).    Marland Kitchen omeprazole (PRILOSEC) 20 MG capsule Take 20 mg by mouth daily as needed (for heartburn).     . triamterene-hydrochlorothiazide (MAXZIDE-25) 37.5-25 MG per tablet Take 0.5 tablets by mouth daily.     No current facility-administered medications for this visit.    OBJECTIVE: Vitals:   05/13/20 1043  BP: (!) 160/86  Pulse: 80  Resp: 19  Temp: 97.8 F (36.6 C)  SpO2: 97%     Body mass index is 37.1 kg/m.    ECOG FS:0 - Asymptomatic  General: Well-developed, well-nourished, no acute distress. Eyes: Pink conjunctiva, anicteric sclera. HEENT: Normocephalic, moist mucous membranes. Breasts: Exam deferred today. Lungs: No audible wheezing or coughing. Heart: Regular rate and rhythm. Abdomen: Soft, nontender, no obvious distention. Musculoskeletal: No edema, cyanosis, or  clubbing. Neuro: Alert, answering all questions appropriately. Cranial nerves grossly intact. Skin: No rashes or petechiae noted. Psych: Normal affect.  LAB RESULTS:  Lab Results  Component Value Date   NA 139 04/22/2017   K 4.2 04/22/2017   CL 101 04/22/2017   CO2 27 04/22/2017   GLUCOSE 105 (H) 04/22/2017   BUN 16 04/22/2017   CREATININE 0.79 04/22/2017   CALCIUM 10.3 04/22/2017   PROT 7.3 11/23/2016   ALBUMIN 4.0 11/23/2016   AST 26 11/23/2016   ALT 17 11/23/2016   ALKPHOS 82 11/23/2016   BILITOT 0.6 11/23/2016   GFRNONAA >60 04/22/2017   GFRAA >60 04/22/2017    Lab Results  Component Value Date   WBC 9.2 04/22/2017   NEUTROABS 4.9 12/04/2013   HGB 14.5 04/22/2017   HCT 44.2 04/22/2017   MCV 91.3 04/22/2017   PLT 310 04/22/2017     STUDIES: MM DIAG BREAST TOMO BILATERAL  Result Date: 05/06/2020 CLINICAL DATA:  86 year old who underwent malignant lumpectomy of the UPPER OUTER QUADRANT of the LEFT breast in 2018 with adjuvant radiation therapy. Annual evaluation. EXAM: DIGITAL DIAGNOSTIC BILATERAL MAMMOGRAM WITH TOMO AND CAD COMPARISON:  Previous exam(s). ACR Breast Density Category b: There are scattered areas of fibroglandular density. FINDINGS: Tomosynthesis and synthesized digital 2D full field CC and MLO views of both breasts and a digital 2D spot magnification MLO view of the lumpectomy site in the LEFT breast were obtained. Post surgical scar/architectural distortion at the lumpectomy site in the UPPER OUTER LEFT breast at MIDDLE to POSTERIOR depth. Benign oil cyst at the lumpectomy site. Stable mild residual post radiation skin thickening and trabecular thickening throughout the LEFT breast. No findings suspicious for malignancy in the LEFT breast. No findings suspicious for malignancy in the RIGHT breast. Mammographic images were processed with CAD. IMPRESSION: 1. No mammographic evidence of malignancy involving either breast. 2. Expected post lumpectomy changes and  residual mild post radiation changes involving the LEFT breast. RECOMMENDATION: BILATERAL diagnostic mammography in 1 year. I have discussed the findings and recommendations with the patient. If applicable, a reminder letter will be sent to the patient regarding the next appointment. BI-RADS CATEGORY  2: Benign. Electronically Signed   By: Evangeline Dakin M.D.   On: 05/06/2020 15:15    ASSESSMENT: Pathologic stage Ia ER positive, PR/HER-2 negative invasive carcinoma of the upper outer quadrant of the left breast.  PLAN:    1.  Pathologic stage Ia ER positive, PR/HER-2 negative invasive carcinoma of the upper outer quadrant of the left breast: Given the small size of patient's tumor and her advanced age, it was decided upon that chemotherapy was not necessary and Oncotype DX was not ordered.  Patient completed adjuvant XRT.  Patient discontinued letrozole secondary to side effects, but now is tolerating anastrozole well.  Continue anastrozole for 5 years completing treatment in July 2024.  Her most recent mammogram on May 06, 2020 was reported as BI-RADS 2.  Repeat in November 2022.  Return to clinic in 6 months for routine evaluation. 2.  Bone health: Repeat bone mineral density on August 14, 2019 revealed a T score of -1.0 which is unchanged from previous.  Repeat in February 2022. 3.  Breast tenderness: Chronic and unchanged.  No evidence of recurrent disease. 4.  Hot flashes: Mild.  Patient does not complain of this today.   Patient expressed understanding and was in agreement with this plan. She also understands that She can call clinic at any time with any questions, concerns, or complaints.   Cancer Staging Primary cancer of upper outer quadrant of left female breast [facility]) Staging form: Breast, AJCC 8th Edition - Clinical stage from 04/17/2017: Stage IA (cT1a, cN0, cM0, G2, ER: Positive, PR: Negative, HER2: Negative) - Signed by Lloyd Huger, MD on 04/17/2017 - Pathologic stage  from 05/11/2017: Stage IA (pT1b, pN0, cM0, G2, ER: Positive, PR: Negative, HER2: Negative) - Signed by Lloyd Huger, MD on 05/11/2017   Lloyd Huger, MD   05/16/2020 6:25 AM

## 2020-05-13 ENCOUNTER — Inpatient Hospital Stay: Payer: PPO | Attending: Oncology | Admitting: Oncology

## 2020-05-13 ENCOUNTER — Encounter: Payer: Self-pay | Admitting: Oncology

## 2020-05-13 VITALS — BP 160/86 | HR 80 | Temp 97.8°F | Resp 19 | Wt 183.7 lb

## 2020-05-13 DIAGNOSIS — Z170 Estrogen receptor positive status [ER+]: Secondary | ICD-10-CM | POA: Insufficient documentation

## 2020-05-13 DIAGNOSIS — Z87891 Personal history of nicotine dependence: Secondary | ICD-10-CM | POA: Insufficient documentation

## 2020-05-13 DIAGNOSIS — C50412 Malignant neoplasm of upper-outer quadrant of left female breast: Secondary | ICD-10-CM | POA: Diagnosis not present

## 2020-05-15 DIAGNOSIS — K5909 Other constipation: Secondary | ICD-10-CM | POA: Diagnosis not present

## 2020-05-15 DIAGNOSIS — K625 Hemorrhage of anus and rectum: Secondary | ICD-10-CM | POA: Diagnosis not present

## 2020-05-15 DIAGNOSIS — K219 Gastro-esophageal reflux disease without esophagitis: Secondary | ICD-10-CM | POA: Diagnosis not present

## 2020-05-15 DIAGNOSIS — K649 Unspecified hemorrhoids: Secondary | ICD-10-CM | POA: Diagnosis not present

## 2020-05-15 DIAGNOSIS — Z86010 Personal history of colonic polyps: Secondary | ICD-10-CM | POA: Diagnosis not present

## 2020-05-15 DIAGNOSIS — R194 Change in bowel habit: Secondary | ICD-10-CM | POA: Diagnosis not present

## 2020-06-13 ENCOUNTER — Other Ambulatory Visit: Payer: Self-pay | Admitting: *Deleted

## 2020-06-13 MED ORDER — ANASTROZOLE 1 MG PO TABS: 1.0000 mg | ORAL_TABLET | Freq: Every day | ORAL | 1 refills | Status: DC

## 2020-08-13 ENCOUNTER — Other Ambulatory Visit: Payer: Self-pay

## 2020-08-13 ENCOUNTER — Ambulatory Visit
Admission: RE | Admit: 2020-08-13 | Discharge: 2020-08-13 | Disposition: A | Discharge status: Home / Self Care | Payer: PPO | Source: Ambulatory Visit | Attending: Oncology | Admitting: Oncology

## 2020-08-13 DIAGNOSIS — Z13820 Encounter for screening for osteoporosis: Secondary | ICD-10-CM | POA: Diagnosis not present

## 2020-08-13 DIAGNOSIS — Z853 Personal history of malignant neoplasm of breast: Secondary | ICD-10-CM | POA: Diagnosis not present

## 2020-08-13 DIAGNOSIS — Z780 Asymptomatic menopausal state: Secondary | ICD-10-CM | POA: Insufficient documentation

## 2020-08-13 DIAGNOSIS — C50412 Malignant neoplasm of upper-outer quadrant of left female breast: Secondary | ICD-10-CM

## 2020-08-13 DIAGNOSIS — Z923 Personal history of irradiation: Secondary | ICD-10-CM | POA: Insufficient documentation

## 2020-08-13 DIAGNOSIS — M85832 Other specified disorders of bone density and structure, left forearm: Secondary | ICD-10-CM | POA: Diagnosis not present

## 2020-08-29 DIAGNOSIS — K625 Hemorrhage of anus and rectum: Secondary | ICD-10-CM | POA: Diagnosis not present

## 2020-08-29 DIAGNOSIS — K649 Unspecified hemorrhoids: Secondary | ICD-10-CM | POA: Diagnosis not present

## 2020-08-29 DIAGNOSIS — R194 Change in bowel habit: Secondary | ICD-10-CM | POA: Diagnosis not present

## 2020-08-29 DIAGNOSIS — K5909 Other constipation: Secondary | ICD-10-CM | POA: Diagnosis not present

## 2020-09-03 DIAGNOSIS — I10 Essential (primary) hypertension: Secondary | ICD-10-CM | POA: Diagnosis not present

## 2020-09-03 DIAGNOSIS — E782 Mixed hyperlipidemia: Secondary | ICD-10-CM | POA: Diagnosis not present

## 2020-09-03 DIAGNOSIS — E119 Type 2 diabetes mellitus without complications: Secondary | ICD-10-CM | POA: Diagnosis not present

## 2020-09-03 DIAGNOSIS — E039 Hypothyroidism, unspecified: Secondary | ICD-10-CM | POA: Diagnosis not present

## 2020-09-03 DIAGNOSIS — K5904 Chronic idiopathic constipation: Secondary | ICD-10-CM | POA: Diagnosis not present

## 2020-09-03 DIAGNOSIS — F32A Depression, unspecified: Secondary | ICD-10-CM | POA: Diagnosis not present

## 2020-09-03 DIAGNOSIS — K219 Gastro-esophageal reflux disease without esophagitis: Secondary | ICD-10-CM | POA: Diagnosis not present

## 2020-09-03 DIAGNOSIS — R300 Dysuria: Secondary | ICD-10-CM | POA: Diagnosis not present

## 2020-09-03 DIAGNOSIS — E559 Vitamin D deficiency, unspecified: Secondary | ICD-10-CM | POA: Diagnosis not present

## 2020-09-04 DIAGNOSIS — E119 Type 2 diabetes mellitus without complications: Secondary | ICD-10-CM | POA: Diagnosis not present

## 2020-09-11 DIAGNOSIS — M79675 Pain in left toe(s): Secondary | ICD-10-CM | POA: Diagnosis not present

## 2020-09-11 DIAGNOSIS — M79674 Pain in right toe(s): Secondary | ICD-10-CM | POA: Diagnosis not present

## 2020-09-11 DIAGNOSIS — E1142 Type 2 diabetes mellitus with diabetic polyneuropathy: Secondary | ICD-10-CM | POA: Diagnosis not present

## 2020-09-11 DIAGNOSIS — L600 Ingrowing nail: Secondary | ICD-10-CM | POA: Diagnosis not present

## 2020-09-11 DIAGNOSIS — B351 Tinea unguium: Secondary | ICD-10-CM | POA: Diagnosis not present

## 2020-09-11 DIAGNOSIS — B353 Tinea pedis: Secondary | ICD-10-CM | POA: Diagnosis not present

## 2020-09-12 DIAGNOSIS — D2272 Melanocytic nevi of left lower limb, including hip: Secondary | ICD-10-CM | POA: Diagnosis not present

## 2020-09-12 DIAGNOSIS — D225 Melanocytic nevi of trunk: Secondary | ICD-10-CM | POA: Diagnosis not present

## 2020-09-12 DIAGNOSIS — D485 Neoplasm of uncertain behavior of skin: Secondary | ICD-10-CM | POA: Diagnosis not present

## 2020-09-12 DIAGNOSIS — L821 Other seborrheic keratosis: Secondary | ICD-10-CM | POA: Diagnosis not present

## 2020-09-12 DIAGNOSIS — D2271 Melanocytic nevi of right lower limb, including hip: Secondary | ICD-10-CM | POA: Diagnosis not present

## 2020-09-12 DIAGNOSIS — C44629 Squamous cell carcinoma of skin of left upper limb, including shoulder: Secondary | ICD-10-CM | POA: Diagnosis not present

## 2020-09-12 DIAGNOSIS — X32XXXA Exposure to sunlight, initial encounter: Secondary | ICD-10-CM | POA: Diagnosis not present

## 2020-09-12 DIAGNOSIS — D2261 Melanocytic nevi of right upper limb, including shoulder: Secondary | ICD-10-CM | POA: Diagnosis not present

## 2020-09-12 DIAGNOSIS — L570 Actinic keratosis: Secondary | ICD-10-CM | POA: Diagnosis not present

## 2020-09-12 DIAGNOSIS — D2262 Melanocytic nevi of left upper limb, including shoulder: Secondary | ICD-10-CM | POA: Diagnosis not present

## 2020-10-10 DIAGNOSIS — C44629 Squamous cell carcinoma of skin of left upper limb, including shoulder: Secondary | ICD-10-CM | POA: Diagnosis not present

## 2020-10-21 DIAGNOSIS — M7022 Olecranon bursitis, left elbow: Secondary | ICD-10-CM | POA: Diagnosis not present

## 2020-10-23 ENCOUNTER — Other Ambulatory Visit: Payer: Self-pay | Admitting: *Deleted

## 2020-10-23 MED ORDER — ANASTROZOLE 1 MG PO TABS: 1.0000 mg | ORAL_TABLET | Freq: Every day | ORAL | 1 refills | Status: DC

## 2020-11-07 NOTE — Progress Notes (Signed)
[place]  Telephone:(336) [phone] Fax:(336) [phone]  ID: Brand Males OB: August 18, 1931  MR#: [id number]  IDP#:[id number]  Patient Care Team: Perrin Maltese, MD as PCP - General (Internal Medicine) Lloyd Huger, MD as Consulting Physician (Oncology)  CHIEF COMPLAINT: Pathologic stage Ia ER positive, PR/HER-2 negative invasive carcinoma of the upper outer quadrant of the left breast.  INTERVAL HISTORY: Patient returns to clinic today for routine 6-week evaluation.  She has multiple medical complaints that are all chronic in nature.  She continues to tolerate anastrozole well without significant side effects.  She has no neurologic complaints. She denies any recent fevers or illnesses. She has a good appetite and denies weight loss. She has no chest pain, but has chronic shortness of breath.  She denies any cough or hemoptysis.  She has no nausea, vomiting, constipation, or diarrhea.  She has no urinary complaints. Patient offer no further specific complaints today.  REVIEW OF SYSTEMS:   Review of Systems  Constitutional: Negative.  Negative for fever, malaise/fatigue and weight loss.  HENT: Negative.  Negative for sore throat.   Respiratory: Negative for cough and shortness of breath.   Cardiovascular: Negative.  Negative for chest pain and leg swelling.  Gastrointestinal: Negative.  Negative for abdominal pain, blood in stool, constipation, diarrhea, heartburn, melena, nausea and vomiting.  Genitourinary: Negative.  Negative for dysuria.  Musculoskeletal: Positive for back pain and joint pain.  Skin: Negative.  Negative for rash.  Neurological: Negative.  Negative for dizziness, sensory change, focal weakness, weakness and headaches.  Psychiatric/Behavioral: Negative.  The patient is not nervous/anxious.     As per HPI. Otherwise, a complete review of systems is negative.  PAST MEDICAL HISTORY: Past Medical History:  Diagnosis Date  . Cancer ([place]) 03/2017    left breast  . Complication of anesthesia    has been slow to wake up  . COPD (chronic obstructive pulmonary disease) ([place])   . Diabetes mellitus without complication ([place])   . Dyspnea   . Edema leg 03/2017   bilateral swelling  . Family history of adverse reaction to anesthesia    son had surgery and heart stopped during procedure  . GERD (gastroesophageal reflux disease)   . Heart murmur    no treatment  . Hemorrhoids 03/2017  . History of kidney stones    had eswl  . Hyperlipidemia   . Hypertension   . Hypothyroidism   . Lumbar stenosis   . Personal history of radiation therapy 2018   LEFT lumpectomy  . Skin cancer 2016,2017,2018   face, back of neck, shoulder, leg, hand  . Sleep apnea    does not and will not use a cpap  . Thyroid disease     PAST SURGICAL HISTORY: Past Surgical History:  Procedure Laterality Date  . ABDOMINAL HYSTERECTOMY  1982  . APPENDECTOMY  1940  . BRE[facility]SY [facility]   [facility]  . BREAST LUMPECTOMY Left 04/30/2017   [facility], clear margins, negative LN  . BREAST LUMPECTOMY WITH NEEDLE LOCALIZATION AND AXILLARY SENTINEL LYMPH NODE BX Left 04/30/2017   Procedure: LEFT BREAST LUMPECTOMY WITH NEEDLE LOCALIZATION AND LEFT AXILLARY SENTINEL LYMPH NODE BX;  Surgeon: Johnathan Hausen, MD;  Location: [facility] ORS;  Service: General;  Laterality: Left;  . CHOLECYSTECTOMY    . EYE SURGERY Bilateral 2003   cataract extraction  . implants  2003   dental, not removable  . JOINT REPLACEMENT Bilateral [id number]   total hip replacements  . JOINT REPLACEMENT  Right 2014   right total knee replacement  . SKIN CANCER EXCISION     left hand and right lower leg  . THYROIDECTOMY, PARTIAL  2003  . TONSILLECTOMY      FAMILY HISTORY: Family History  Problem Relation Age of Onset  . Heart failure Mother   . Heart attack Father   . Diabetes Brother   . Heart disease Brother   . Melanoma Brother   . Cancer Maternal Uncle   . Diabetes Paternal Aunt   . Heart  disease Paternal Aunt   . Heart disease Paternal Grandmother   . Diabetes Maternal Aunt   . Heart attack Maternal Aunt   . Diabetes Maternal Aunt   . Diabetes Paternal Aunt   . Heart disease Paternal Aunt   . Breast cancer Neg Hx     ADVANCED DIRECTIVES (Y/N):  N  HEALTH MAINTENANCE: Social History   Tobacco Use  . Smoking status: Former Smoker    Packs/day: 1.50    Types: Cigarettes    Quit date: 06/29/1985    Years since quitting: 35.3  . Smokeless tobacco: Never Used  Vaping Use  . Vaping Use: Never used  Substance Use Topics  . Alcohol use: Yes    Alcohol/week: 1.0 - 2.0 standard drink    Types: 1 - 2 Standard drinks or equivalent per week    Comment: wine 3 times a week  . Drug use: No     Colonoscopy:  PAP:  Bone density:  Lipid panel:  Allergies  Allergen Reactions  . Elemental Sulfur Hives and Shortness Of Breath  . Iodinated Diagnostic Agents Hives, Shortness Of Breath and Other (See Comments)    Restless. Difficulty breathing. Topical betadine okay  Restless   . Codeine Nausea And Vomiting    With high doses, passes out  . Morphine Nausea And Vomiting    With high doses, passes out  . Oxycodone Nausea And Vomiting and Other (See Comments)    Dizzy and with high doses, passes out.  Marland Kitchen Penicillins Other (See Comments)    "Hardened lump" Has patient had a PCN reaction causing immediate rash, facial/tongue/throat swelling, SOB or lightheadedness with hypotension: No Has patient had a PCN reaction causing severe rash involving mucus membranes or skin necrosis: No Has patient had a PCN reaction that required hospitalization No Has patient had a PCN reaction occurring within the last 10 years: No If all of the above answers are "NO", then may proceed with Cephalosporin use.    . Sulfa Antibiotics Rash    Current Outpatient Medications  Medication Sig Dispense Refill  . anastrozole (ARIMIDEX) 1 MG tablet Take 1 tablet (1 mg total) by mouth daily. 90  tablet 1  . aspirin EC 81 MG EC tablet Take 1 tablet (81 mg total) by mouth daily. 30 tablet 3  . atorvastatin (LIPITOR) 20 MG tablet Take by mouth.    . bisacodyl (DULCOLAX) 5 MG EC tablet Take 2 tablets (10 mg total) by mouth daily. 60 tablet 11  . Coenzyme Q10 (CO Q 10 PO) Take 100 mg by mouth daily.     Marland Kitchen diltiazem (CARDIZEM CD) 300 MG 24 hr capsule Take 1 capsule (300 mg total) by mouth daily. 7 capsule 0  . FARXIGA 10 MG TABS tablet Take 1 tablet by mouth daily.    Marland Kitchen losartan (COZAAR) 50 MG tablet Take 50 mg by mouth at bedtime.     . metoprolol succinate (TOPROL-XL) 25 MG 24 hr tablet Take  1 tablet by mouth daily.    . Multiple Vitamins-Minerals (PRESERVISION AREDS 2) CAPS Take 1 capsule by mouth 2 (two) times daily.     . naproxen sodium (ANAPROX) 220 MG tablet Take 220-440 mg by mouth 2 (two) times daily as needed (for pain).    Marland Kitchen omeprazole (PRILOSEC) 20 MG capsule Take 20 mg by mouth daily as needed (for heartburn).     . triamterene-hydrochlorothiazide (MAXZIDE-25) 37.5-25 MG per tablet Take 0.5 tablets by mouth daily.    Marland Kitchen levothyroxine (SYNTHROID) 50 MCG tablet Take 1 tablet (50 mcg total) by mouth daily. 30 tablet 6   No current facility-administered medications for this visit.    OBJECTIVE: Vitals:   11/12/20 1107  BP: (!) 176/78  Pulse: 76  Resp: 18  Temp: (!) 94.8 F (34.9 C)  SpO2: 99%     Body mass index is 36.86 kg/m.    ECOG FS:1 - Symptomatic but completely ambulatory  General: Well-developed, well-nourished, no acute distress. Eyes: Pink conjunctiva, anicteric sclera. HEENT: Normocephalic, moist mucous membranes. Breasts: Exam deferred today. Lungs: No audible wheezing or coughing. Heart: Regular rate and rhythm. Abdomen: Soft, nontender, no obvious distention. Musculoskeletal: No edema, cyanosis, or clubbing. Neuro: Alert, answering all questions appropriately. Cranial nerves grossly intact. Skin: No rashes or petechiae noted. Psych: Normal  affect.  LAB RESULTS:  Lab Results  Component Value Date   NA 139 04/22/2017   K 4.2 04/22/2017   CL 101 04/22/2017   CO2 27 04/22/2017   GLUCOSE 105 (H) 04/22/2017   BUN 16 04/22/2017   CREATININE 0.79 04/22/2017   CALCIUM 10.3 04/22/2017   PROT 7.3 11/23/2016   ALBUMIN 4.0 11/23/2016   AST 26 11/23/2016   ALT 17 11/23/2016   ALKPHOS 82 11/23/2016   BILITOT 0.6 11/23/2016   GFRNONAA >60 04/22/2017   GFRAA >60 04/22/2017    Lab Results  Component Value Date   WBC 9.2 04/22/2017   NEUTROABS 4.9 12/04/2013   HGB 14.5 04/22/2017   HCT 44.2 04/22/2017   MCV 91.3 04/22/2017   PLT 310 04/22/2017     STUDIES: No results found.  ASSESSMENT: Pathologic stage Ia ER positive, PR/HER-2 negative invasive carcinoma of the upper outer quadrant of the left breast.  PLAN:    1.  Pathologic stage Ia ER positive, PR/HER-2 negative invasive carcinoma of the upper outer quadrant of the left breast: Given the small size of patient's tumor and her advanced age, it was decided upon that chemotherapy was not necessary and Oncotype DX was not ordered.  Patient completed adjuvant XRT.  Patient discontinued letrozole secondary to side effects, but now is tolerating anastrozole well.  Continue anastrozole for a total of 5 years completing in July 2024. Her most recent mammogram on May 06, 2020 was reported as BI-RADS 2.  Repeat in November 2022.  Return to clinic in 6 months for routine evaluation.  2.  Osteopenia: Bone mineral density completed on August 13, 2020 reported T score of -1.8.  This is slightly worse than 1 year prior where the T score was reported -1.0, patient remains osteopenic.  Continue calcium and vitamin D supplementation.  Repeat in February 2023.   3.  Breast tenderness: Chronic and unchanged.  No evidence of recurrent disease.   Patient expressed understanding and was in agreement with this plan. She also understands that She can call clinic at any time with any  questions, concerns, or complaints.   Cancer Staging Primary cancer of upper outer quadrant of  left female breast [facility]) Staging form: Breast, AJCC 8th Edition - Clinical stage from 04/17/2017: Stage IA (cT1a, cN0, cM0, G2, ER: Positive, PR: Negative, HER2: Negative) - Signed by Lloyd Huger, MD on 04/17/2017 Histologic grading system: 3 grade system - Pathologic stage from 05/11/2017: Stage IA (pT1b, pN0, cM0, G2, ER: Positive, PR: Negative, HER2: Negative) - Signed by Lloyd Huger, MD on 05/11/2017 Neoadjuvant therapy: No Histologic grading system: 3 grade system Laterality: Left   Lloyd Huger, MD   11/12/2020 12:13 PM

## 2020-11-12 ENCOUNTER — Encounter: Payer: Self-pay | Admitting: Oncology

## 2020-11-12 ENCOUNTER — Inpatient Hospital Stay: Payer: PPO | Attending: Oncology | Admitting: Oncology

## 2020-11-12 ENCOUNTER — Other Ambulatory Visit: Payer: Self-pay

## 2020-11-12 VITALS — BP 176/78 | HR 76 | Temp 94.8°F | Resp 18 | Ht 59.0 in | Wt 182.5 lb

## 2020-11-12 DIAGNOSIS — Z87891 Personal history of nicotine dependence: Secondary | ICD-10-CM | POA: Diagnosis not present

## 2020-11-12 DIAGNOSIS — C50412 Malignant neoplasm of upper-outer quadrant of left female breast: Secondary | ICD-10-CM | POA: Diagnosis not present

## 2020-11-12 DIAGNOSIS — Z1231 Encounter for screening mammogram for malignant neoplasm of breast: Secondary | ICD-10-CM

## 2020-11-12 DIAGNOSIS — Z170 Estrogen receptor positive status [ER+]: Secondary | ICD-10-CM | POA: Insufficient documentation

## 2020-11-12 DIAGNOSIS — M8580 Other specified disorders of bone density and structure, unspecified site: Secondary | ICD-10-CM | POA: Diagnosis not present

## 2020-11-19 DIAGNOSIS — G5622 Lesion of ulnar nerve, left upper limb: Secondary | ICD-10-CM | POA: Diagnosis not present

## 2020-11-19 DIAGNOSIS — M7022 Olecranon bursitis, left elbow: Secondary | ICD-10-CM | POA: Diagnosis not present

## 2020-12-04 DIAGNOSIS — I10 Essential (primary) hypertension: Secondary | ICD-10-CM | POA: Diagnosis not present

## 2020-12-04 DIAGNOSIS — E782 Mixed hyperlipidemia: Secondary | ICD-10-CM | POA: Diagnosis not present

## 2020-12-04 DIAGNOSIS — G629 Polyneuropathy, unspecified: Secondary | ICD-10-CM | POA: Diagnosis not present

## 2020-12-04 DIAGNOSIS — R300 Dysuria: Secondary | ICD-10-CM | POA: Diagnosis not present

## 2020-12-04 DIAGNOSIS — J449 Chronic obstructive pulmonary disease, unspecified: Secondary | ICD-10-CM | POA: Diagnosis not present

## 2020-12-04 DIAGNOSIS — E1169 Type 2 diabetes mellitus with other specified complication: Secondary | ICD-10-CM | POA: Diagnosis not present

## 2020-12-04 DIAGNOSIS — E039 Hypothyroidism, unspecified: Secondary | ICD-10-CM | POA: Diagnosis not present

## 2020-12-04 DIAGNOSIS — E7800 Pure hypercholesterolemia, unspecified: Secondary | ICD-10-CM | POA: Diagnosis not present

## 2020-12-04 DIAGNOSIS — E119 Type 2 diabetes mellitus without complications: Secondary | ICD-10-CM | POA: Diagnosis not present

## 2020-12-18 DIAGNOSIS — M79674 Pain in right toe(s): Secondary | ICD-10-CM | POA: Diagnosis not present

## 2020-12-18 DIAGNOSIS — B351 Tinea unguium: Secondary | ICD-10-CM | POA: Diagnosis not present

## 2020-12-18 DIAGNOSIS — M79675 Pain in left toe(s): Secondary | ICD-10-CM | POA: Diagnosis not present

## 2021-02-07 DIAGNOSIS — R300 Dysuria: Secondary | ICD-10-CM | POA: Diagnosis not present

## 2021-02-17 IMAGING — MG DIGITAL DIAGNOSTIC BILAT W/ TOMO W/ CAD
8 of 12 series · 8 of 32 positions shown · non-contrast
Comparison: Previous exam(s).

CLINICAL DATA: 88-year-old who underwent malignant lumpectomy of
the UPPER OUTER QUADRANT of the LEFT breast in 4065 with adjuvant
radiation therapy. Annual evaluation.

EXAM:
DIGITAL DIAGNOSTIC BILATERAL MAMMOGRAM WITH TOMO AND CAD

[L MLO (1 of 2)]
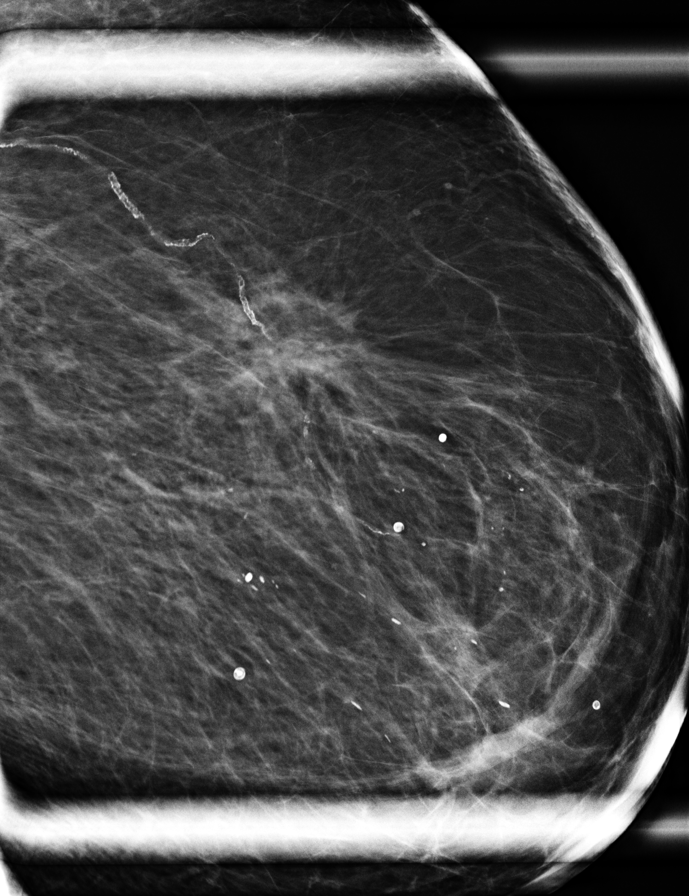

[L MLO (2 of 2)]
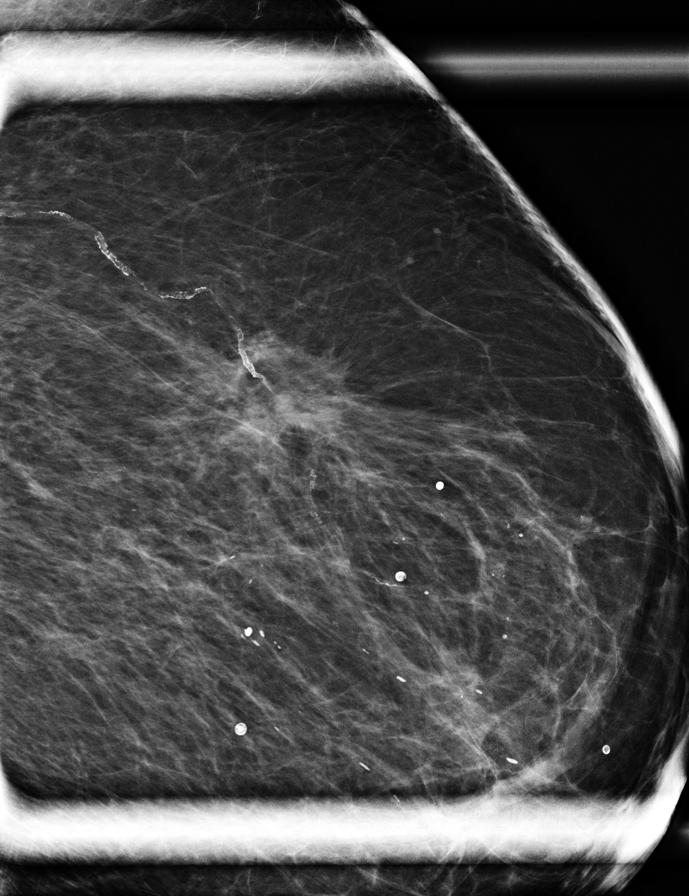

[R MLO synth-2D]
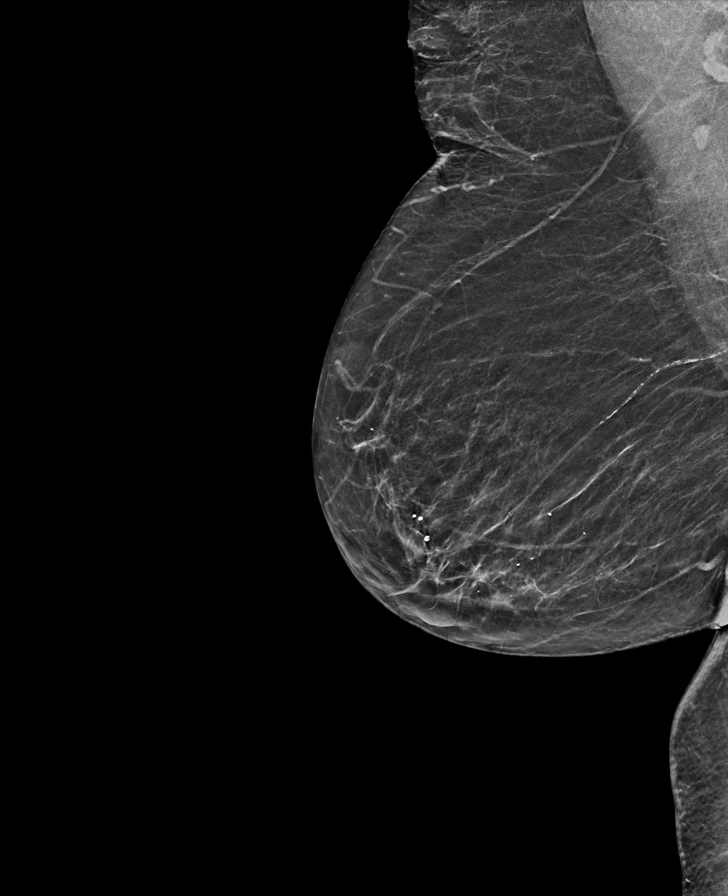

[R CC synth-2D (1 of 2)]
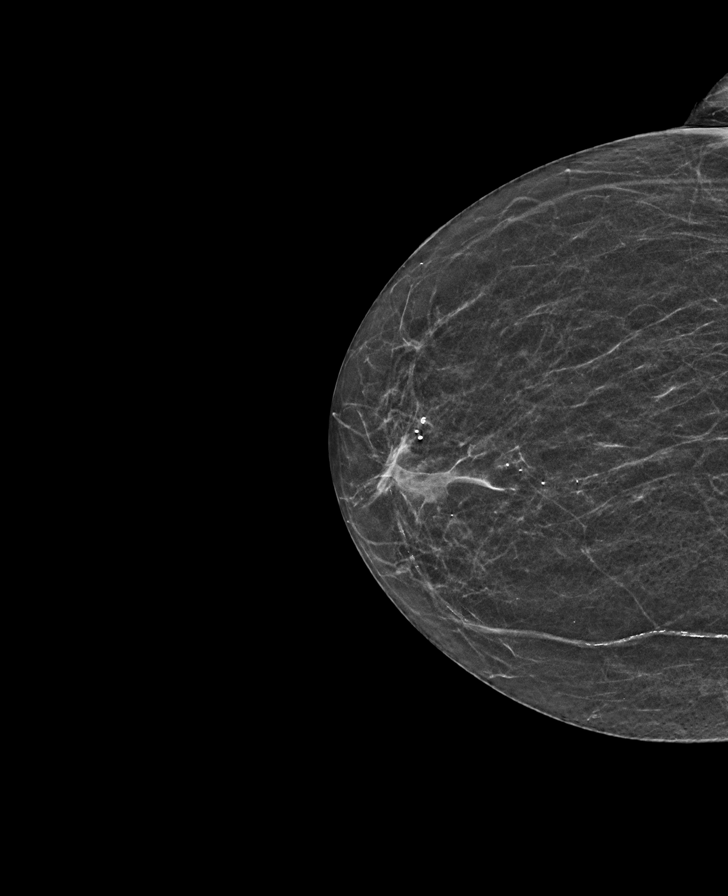

[R CC synth-2D (2 of 2)]
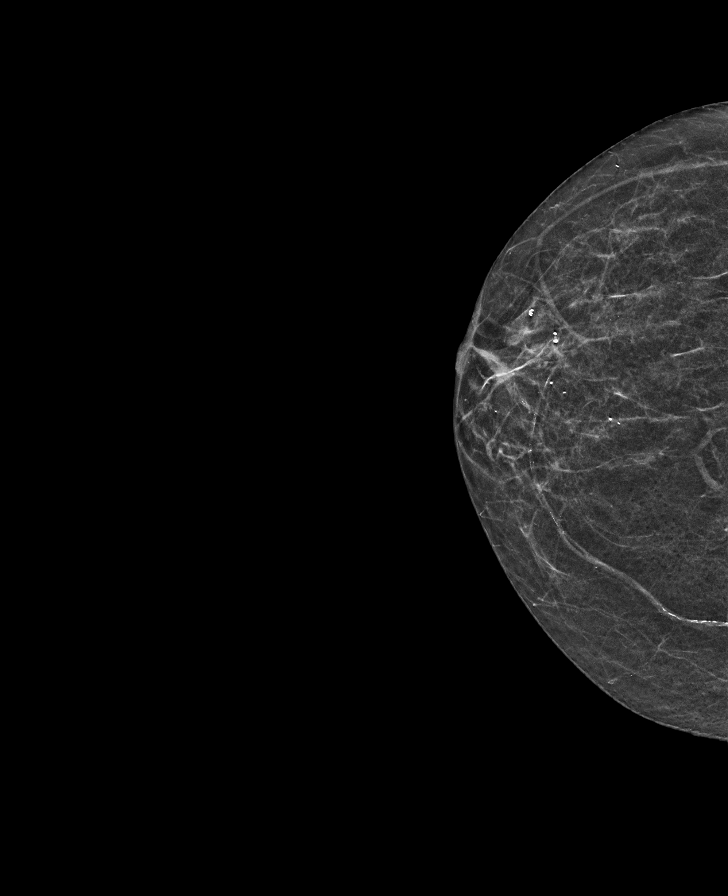

[L CC synth-2D]
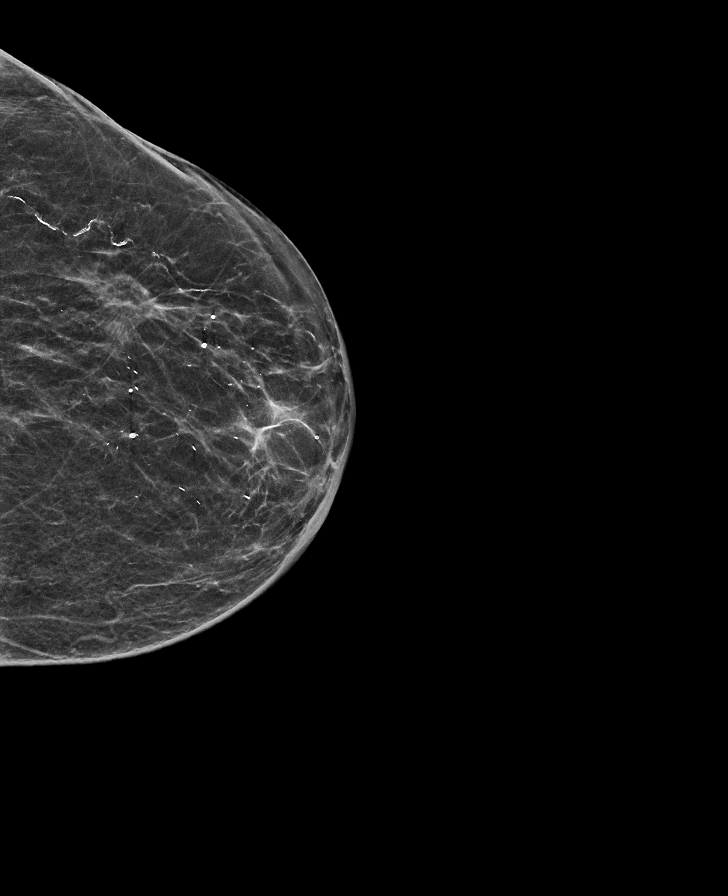

[L MLO synth-2D]
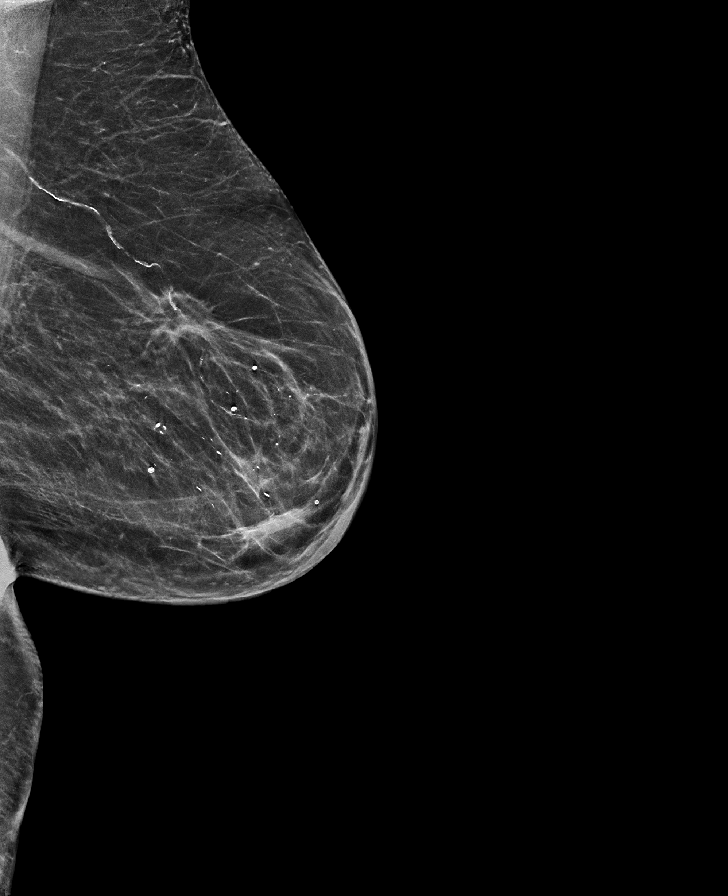

[R CC tomo · tomo slice 21/41.0]
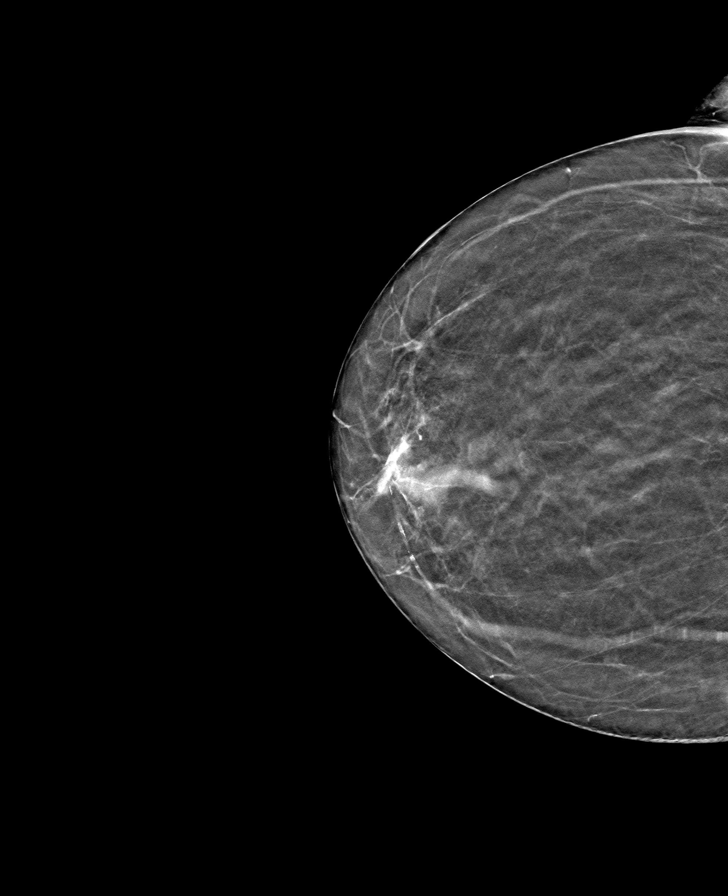

[8 of 32 positions shown; findings below may reference images not displayed]

ACR Breast Density Category b: There are scattered areas of
fibroglandular density.
FINDINGS: Tomosynthesis and synthesized digital 2D full field CC and MLO views
of both breasts and a digital 2D spot magnification MLO view of the
lumpectomy site in the LEFT breast were obtained.

Post surgical scar/architectural distortion at the lumpectomy site
in the UPPER OUTER LEFT breast at MIDDLE to POSTERIOR depth. Benign
oil cyst at the lumpectomy site. Stable mild residual post radiation
skin thickening and trabecular thickening throughout the LEFT
breast. No findings suspicious for malignancy in the LEFT breast.

No findings suspicious for malignancy in the RIGHT breast.

Mammographic images were processed with CAD.
IMPRESSION: 1. No mammographic evidence of malignancy involving either breast.
2. Expected post lumpectomy changes and residual mild post radiation
changes involving the LEFT breast.

RECOMMENDATION:
BILATERAL diagnostic mammography in 1 year.

I have discussed the findings and recommendations with the patient.
If applicable, a reminder letter will be sent to the patient
regarding the next appointment.

BI-RADS CATEGORY  2: Benign.

## 2021-03-21 DIAGNOSIS — B372 Candidiasis of skin and nail: Secondary | ICD-10-CM | POA: Diagnosis not present

## 2021-03-21 DIAGNOSIS — D0471 Carcinoma in situ of skin of right lower limb, including hip: Secondary | ICD-10-CM | POA: Diagnosis not present

## 2021-03-21 DIAGNOSIS — L820 Inflamed seborrheic keratosis: Secondary | ICD-10-CM | POA: Diagnosis not present

## 2021-03-21 DIAGNOSIS — R58 Hemorrhage, not elsewhere classified: Secondary | ICD-10-CM | POA: Diagnosis not present

## 2021-03-21 DIAGNOSIS — L821 Other seborrheic keratosis: Secondary | ICD-10-CM | POA: Diagnosis not present

## 2021-03-21 DIAGNOSIS — R208 Other disturbances of skin sensation: Secondary | ICD-10-CM | POA: Diagnosis not present

## 2021-03-31 DIAGNOSIS — B351 Tinea unguium: Secondary | ICD-10-CM | POA: Diagnosis not present

## 2021-03-31 DIAGNOSIS — K2100 Gastro-esophageal reflux disease with esophagitis, without bleeding: Secondary | ICD-10-CM | POA: Diagnosis not present

## 2021-03-31 DIAGNOSIS — E1165 Type 2 diabetes mellitus with hyperglycemia: Secondary | ICD-10-CM | POA: Diagnosis not present

## 2021-03-31 DIAGNOSIS — J449 Chronic obstructive pulmonary disease, unspecified: Secondary | ICD-10-CM | POA: Diagnosis not present

## 2021-03-31 DIAGNOSIS — E119 Type 2 diabetes mellitus without complications: Secondary | ICD-10-CM | POA: Diagnosis not present

## 2021-03-31 DIAGNOSIS — E782 Mixed hyperlipidemia: Secondary | ICD-10-CM | POA: Diagnosis not present

## 2021-03-31 DIAGNOSIS — R300 Dysuria: Secondary | ICD-10-CM | POA: Diagnosis not present

## 2021-03-31 DIAGNOSIS — E669 Obesity, unspecified: Secondary | ICD-10-CM | POA: Diagnosis not present

## 2021-03-31 DIAGNOSIS — M545 Low back pain, unspecified: Secondary | ICD-10-CM | POA: Diagnosis not present

## 2021-03-31 DIAGNOSIS — E049 Nontoxic goiter, unspecified: Secondary | ICD-10-CM | POA: Diagnosis not present

## 2021-03-31 DIAGNOSIS — E1142 Type 2 diabetes mellitus with diabetic polyneuropathy: Secondary | ICD-10-CM | POA: Diagnosis not present

## 2021-03-31 DIAGNOSIS — E039 Hypothyroidism, unspecified: Secondary | ICD-10-CM | POA: Diagnosis not present

## 2021-03-31 DIAGNOSIS — I10 Essential (primary) hypertension: Secondary | ICD-10-CM | POA: Diagnosis not present

## 2021-04-18 DIAGNOSIS — B372 Candidiasis of skin and nail: Secondary | ICD-10-CM | POA: Diagnosis not present

## 2021-04-18 DIAGNOSIS — L820 Inflamed seborrheic keratosis: Secondary | ICD-10-CM | POA: Diagnosis not present

## 2021-04-18 DIAGNOSIS — D0471 Carcinoma in situ of skin of right lower limb, including hip: Secondary | ICD-10-CM | POA: Diagnosis not present

## 2021-04-18 DIAGNOSIS — L538 Other specified erythematous conditions: Secondary | ICD-10-CM | POA: Diagnosis not present

## 2021-04-18 DIAGNOSIS — L298 Other pruritus: Secondary | ICD-10-CM | POA: Diagnosis not present

## 2021-04-18 DIAGNOSIS — R58 Hemorrhage, not elsewhere classified: Secondary | ICD-10-CM | POA: Diagnosis not present

## 2021-04-25 ENCOUNTER — Telehealth: Payer: Self-pay | Admitting: Oncology

## 2021-04-25 NOTE — Telephone Encounter (Signed)
Pt called to state that she has another appt on 11-14 and needs to reschedule. Please call back at [phone]

## 2021-05-07 ENCOUNTER — Other Ambulatory Visit: Payer: Self-pay

## 2021-05-07 ENCOUNTER — Ambulatory Visit
Admission: RE | Admit: 2021-05-07 | Discharge: 2021-05-07 | Disposition: A | Discharge status: Home / Self Care | Payer: PPO | Source: Ambulatory Visit | Attending: Oncology | Admitting: Oncology

## 2021-05-07 DIAGNOSIS — Z1231 Encounter for screening mammogram for malignant neoplasm of breast: Secondary | ICD-10-CM | POA: Insufficient documentation

## 2021-05-09 ENCOUNTER — Other Ambulatory Visit: Payer: Self-pay

## 2021-05-09 ENCOUNTER — Inpatient Hospital Stay: Payer: PPO | Attending: Oncology | Admitting: Nurse Practitioner

## 2021-05-09 ENCOUNTER — Encounter: Payer: Self-pay | Admitting: Nurse Practitioner

## 2021-05-09 VITALS — BP 152/78 | HR 98 | Temp 97.5°F | Resp 18 | Wt 181.3 lb

## 2021-05-09 DIAGNOSIS — Z79811 Long term (current) use of aromatase inhibitors: Secondary | ICD-10-CM

## 2021-05-09 DIAGNOSIS — Z5181 Encounter for therapeutic drug level monitoring: Secondary | ICD-10-CM

## 2021-05-09 DIAGNOSIS — C50412 Malignant neoplasm of upper-outer quadrant of left female breast: Secondary | ICD-10-CM | POA: Diagnosis not present

## 2021-05-09 NOTE — Progress Notes (Signed)
[place]  [phone] [phone]) [phone]  ID: Brand Males OB: [birth year]  MR#: [id number]  QIH#:[id number]  Patient Care Team: Perrin Maltese, MD as PCP - General (Internal Medicine) Lloyd Huger, MD as Consulting Physician (Oncology)  CHIEF COMPLAINT: Pathologic stage Ia ER positive, PR/HER-2 negative invasive carcinoma of the upper outer quadrant of the left breast.  INTERVAL HISTORY: Patient returns to clinic today for routine 6 month evaluation. She has ongoing plaints of back and joint pain. Also complains of hemorrhoids. Continues to tolerate anastrozole without significant side effects. She has no neurologic complaints. She denies any recent fevers or illnesses. She has a good appetite and denies weight loss. She has no chest pain, but has chronic shortness of breath.  She denies any cough or hemoptysis.  She has no nausea, vomiting, constipation, or diarrhea.  She has no urinary complaints. Patient offer no further specific complaints today.  REVIEW OF SYSTEMS:   Review of Systems  Constitutional: Negative.  Negative for fever, malaise/fatigue and weight loss.  HENT: Negative.  Negative for sore throat.   Respiratory:  Negative for cough and shortness of breath.   Cardiovascular: Negative.  Negative for chest pain and leg swelling.  Gastrointestinal: Negative.  Negative for abdominal pain, blood in stool, constipation, diarrhea, heartburn, melena, nausea and vomiting.  Genitourinary: Negative.  Negative for dysuria.  Musculoskeletal:  Positive for back pain and joint pain.  Skin: Negative.  Negative for rash.  Neurological: Negative.  Negative for dizziness, sensory change, focal weakness, weakness and headaches.  Psychiatric/Behavioral: Negative.  The patient is not nervous/anxious.   As per HPI. Otherwise, a complete review of systems is negative.  PAST MEDICAL HISTORY: Past Medical History:  Diagnosis Date   Cancer ([place]) 03/2017    left breast   Complication of anesthesia    has been slow to wake up   COPD (chronic obstructive pulmonary disease) ([place])    Diabetes mellitus without complication ([place])    Dyspnea    Edema leg 03/2017   bilateral swelling   Family history of adverse reaction to anesthesia    son had surgery and heart stopped during procedure   GERD (gastroesophageal reflux disease)    Heart murmur    no treatment   Hemorrhoids 03/2017   History of kidney stones    had eswl   Hyperlipidemia    Hypertension    Hypothyroidism    Lumbar stenosis    Personal history of radiation therapy 2018   LEFT lumpectomy   Skin cancer 2016,2017,2018   face, back of neck, shoulder, leg, hand   Sleep apnea    does not and will not use a cpap   Thyroid disease     PAST SURGICAL HISTORY: Past Surgical History:  Procedure Laterality Date   ABDOMINAL HYSTERECTOMY  1982   APPENDECTOMY  1940   BREAST BIOPSY Left 04/09/2017   [facility]   BREAST LUMPECTOMY Left 04/30/2017   [place], clear margins, negative LN   BREAST LUMPECTOMY WITH NEEDLE LOCALIZATION AND AXILLARY SENTINEL LYMPH NODE BX Left 04/30/2017   Procedure: LEFT BREAST LUMPECTOMY WITH NEEDLE LOCALIZATION AND LEFT AXILLARY SENTINEL LYMPH NODE BX;  Surgeon: Johnathan Hausen, MD;  Location: [facility] ORS;  Service: General;  Laterality: Left;   CHOLECYSTECTOMY     EYE SURGERY Bilateral 2003   cataract extraction   implants  2003   dental, not removable   JOINT REPLACEMENT Bilateral 2003,2012   total hip replacements   JOINT REPLACEMENT Right 2014  right total knee replacement   SKIN CANCER EXCISION     left hand and right lower leg   THYROIDECTOMY, PARTIAL  2003   TONSILLECTOMY      FAMILY HISTORY: Family History  Problem Relation Age of Onset   Heart failure Mother    Heart attack Father    Diabetes Brother    Heart disease Brother    Melanoma Brother    Cancer Maternal Uncle    Diabetes Paternal Aunt    Heart disease Paternal Aunt    Heart disease  Paternal Grandmother    Diabetes Maternal Aunt    Heart attack Maternal Aunt    Diabetes Maternal Aunt    Diabetes Paternal Aunt    Heart disease Paternal Aunt    Breast cancer Neg Hx     ADVANCED DIRECTIVES (Y/N):  N  HEALTH MAINTENANCE: Social History   Tobacco Use   Smoking status: Former    Packs/day: 1.50    Types: Cigarettes    Quit date: 06/29/1985    Years since quitting: 35.8   Smokeless tobacco: Never  Vaping Use   Vaping Use: Never used  Substance Use Topics   Alcohol use: Yes    Alcohol/week: 1.0 - 2.0 standard drink    Types: 1 - 2 Standard drinks or equivalent per week    Comment: wine 3 times a week   Drug use: No     Colonoscopy:  PAP:  Bone density:  Lipid panel:  Allergies  Allergen Reactions   Elemental Sulfur Hives and Shortness Of Breath   Iodinated Diagnostic Agents Hives, Shortness Of Breath and Other (See Comments)    Restless. Difficulty breathing. Topical betadine okay  Restless    Codeine Nausea And Vomiting    With high doses, passes out   Morphine Nausea And Vomiting    With high doses, passes out   Oxycodone Nausea And Vomiting and Other (See Comments)    Dizzy and with high doses, passes out.   Penicillins Other (See Comments)    "Hardened lump" Has patient had a PCN reaction causing immediate rash, facial/tongue/throat swelling, SOB or lightheadedness with hypotension: No Has patient had a PCN reaction causing severe rash involving mucus membranes or skin necrosis: No Has patient had a PCN reaction that required hospitalization No Has patient had a PCN reaction occurring within the last 10 years: No If all of the above answers are "NO", then may proceed with Cephalosporin use.     Sulfa Antibiotics Rash    Current Outpatient Medications  Medication Sig Dispense Refill   anastrozole (ARIMIDEX) 1 MG tablet Take 1 tablet (1 mg total) by mouth daily. 90 tablet 1   aspirin EC 81 MG EC tablet Take 1 tablet (81 mg total) by mouth  daily. 30 tablet 3   atorvastatin (LIPITOR) 20 MG tablet Take by mouth.     bisacodyl (DULCOLAX) 5 MG EC tablet Take 2 tablets (10 mg total) by mouth daily. 60 tablet 11   Coenzyme Q10 (CO Q 10 PO) Take 100 mg by mouth daily.      diltiazem (CARDIZEM CD) 300 MG 24 hr capsule Take 1 capsule (300 mg total) by mouth daily. 7 capsule 0   econazole nitrate 1 % cream Use 1 % CREAM     FARXIGA 10 MG TABS tablet Take 1 tablet by mouth daily.     losartan (COZAAR) 50 MG tablet Take 50 mg by mouth at bedtime.      metFORMIN (GLUCOPHAGE)  500 MG tablet Take 500 mg by mouth daily with breakfast.     metoprolol succinate (TOPROL-XL) 25 MG 24 hr tablet Take 1 tablet by mouth daily.     Multiple Vitamins-Minerals (PRESERVISION AREDS 2) CAPS Take 1 capsule by mouth 2 (two) times daily.      naproxen sodium (ANAPROX) 220 MG tablet Take 220-440 mg by mouth 2 (two) times daily as needed (for pain).     omeprazole (PRILOSEC) 20 MG capsule Take 20 mg by mouth daily as needed (for heartburn).      triamterene-hydrochlorothiazide (MAXZIDE-25) 37.5-25 MG per tablet Take 0.5 tablets by mouth daily.     levothyroxine (SYNTHROID) 50 MCG tablet Take 1 tablet (50 mcg total) by mouth daily. 30 tablet 6   No current facility-administered medications for this visit.    OBJECTIVE: Vitals:   05/09/21 1353  BP: (!) 152/78  Pulse: 98  Resp: 18  Temp: (!) 97.5 F (36.4 C)  SpO2: 99%     Body mass index is 36.62 kg/m.    ECOG FS:1 - Symptomatic but completely ambulatory  General: Well-developed, well-nourished, no acute distress. Eyes: Pink conjunctiva, anicteric sclera. HEENT: Normocephalic, moist mucous membranes. Breasts: Exam deferred today. Lungs: No audible wheezing or coughing. Heart: Regular rate and rhythm. Abdomen: Soft, nontender, no obvious distention. Musculoskeletal: No edema, cyanosis, or clubbing. Neuro: Alert, answering all questions appropriately. Cranial nerves grossly intact. Skin: No rashes or  petechiae noted. Psych: Normal affect.  LAB RESULTS:  Lab Results  Component Value Date   NA 139 04/22/2017   K 4.2 04/22/2017   CL 101 04/22/2017   CO2 27 04/22/2017   GLUCOSE 105 (H) 04/22/2017   BUN 16 04/22/2017   CREATININE 0.79 04/22/2017   CALCIUM 10.3 04/22/2017   PROT 7.3 11/23/2016   ALBUMIN 4.0 11/23/2016   AST 26 11/23/2016   ALT 17 11/23/2016   ALKPHOS 82 11/23/2016   BILITOT 0.6 11/23/2016   GFRNONAA >60 04/22/2017   GFRAA >60 04/22/2017    Lab Results  Component Value Date   WBC 9.2 04/22/2017   NEUTROABS 4.9 12/04/2013   HGB 14.5 04/22/2017   HCT 44.2 04/22/2017   MCV 91.3 04/22/2017   PLT 310 04/22/2017    STUDIES: MM 3D SCREEN BREAST BILATERAL  Result Date: 05/07/2021 CLINICAL DATA:  Screening. EXAM: DIGITAL SCREENING BILATERAL MAMMOGRAM WITH TOMOSYNTHESIS AND CAD TECHNIQUE: Bilateral screening digital craniocaudal and mediolateral oblique mammograms were obtained. Bilateral screening digital breast tomosynthesis was performed. The images were evaluated with computer-aided detection. COMPARISON:  Previous exam(s). ACR Breast Density Category b: There are scattered areas of fibroglandular density. FINDINGS: There are no findings suspicious for malignancy. IMPRESSION: No mammographic evidence of malignancy. A result letter of this screening mammogram will be mailed directly to the patient. RECOMMENDATION: Screening mammogram in one year. (Code:SM-B-01Y) BI-RADS CATEGORY  1: Negative. Electronically Signed   By: Claudie Revering M.D.   On: 05/07/2021 13:46   ASSESSMENT: Pathologic stage Ia ER positive, PR/HER-2 negative invasive carcinoma of the upper outer quadrant of the left breast.  PLAN:    1.  Pathologic stage Ia ER positive, PR/HER-2 negative invasive carcinoma of the upper outer quadrant of the left breast: Given the small size of patient's tumor and her advanced age, it was decided upon that chemotherapy was not necessary and Oncotype DX was not  ordered.  Patient completed adjuvant XRT.  Patient discontinued letrozole secondary to side effects, but now is tolerating anastrozole well.  Continue anastrozole for a total  of 5 years completing in July 2024. Her most recent mammogram on May 07, 2021 was reported as BI-RADS 1: negative.  Repeat in November 2023.  Return to clinic in 6 months for routine evaluation.   2.  Osteopenia: Bone mineral density completed on August 13, 2020 reported T score of -1.8.  This is slightly worse than 1 year prior where the T score was reported -1.0, patient remains osteopenic.  Continue calcium and vitamin D supplementation.  Repeat in February 2023.    3.  Breast tenderness: doesn't complain of this today.   4. Hemorrhoids- follow up with GI  5. Back Pain- follow up with physiatry, or neurosurgery  Disposition: Bone density scan in February 2023.  6 months- follow up with Dr. Grayland Ormond for continued monitoring of anastrozole.   Patient expressed understanding and was in agreement with this plan. She also understands that She can call clinic at any time with any questions, concerns, or complaints.   Cancer Staging Primary cancer of upper outer quadrant of left female breast [facility]) Staging form: Breast, AJCC 8th Edition - Clinical stage from 04/17/2017: Stage IA (cT1a, cN0, cM0, G2, ER+, PR-, HER2-) - Signed by Lloyd Huger, MD on 04/17/2017 Histologic grading system: 3 grade system - Pathologic stage from 05/11/2017: Stage IA (pT1b, pN0, cM0, G2, ER+, PR-, HER2-) - Signed by Lloyd Huger, MD on 05/11/2017 Neoadjuvant therapy: No Histologic grading system: 3 grade system Laterality: Left  Verlon Au, NP   05/09/2021

## 2021-05-09 NOTE — Progress Notes (Signed)
Pt here to discuss mammogram results. Pt c/o yeast infection under left breast and is being treated. Pt c/o discomfort on left side in breast/axillary area. Pt c/o metallic smelling perspiration under right axillary area that is concerning to patient. Pt also c/o constipation and hemorrhoids with occasional bright red blood noted with bowel movements. Pt c/o chronic back pain d/t spinal stenosis.

## 2021-05-12 ENCOUNTER — Ambulatory Visit: Payer: PPO | Admitting: Nurse Practitioner

## 2021-10-09 ENCOUNTER — Other Ambulatory Visit: Payer: Self-pay | Admitting: *Deleted

## 2021-10-09 MED ORDER — ANASTROZOLE 1 MG PO TABS: 1.0000 mg | ORAL_TABLET | Freq: Every day | ORAL | 0 refills | Status: DC

## 2021-11-01 NOTE — Progress Notes (Deleted)
[place]  [phone] [phone]) [phone]  ID: Brand Males OB: 1931/10/22  MR#: [id number]  KDT#:[id number]  Patient Care Team: Perrin Maltese, MD as PCP - General (Internal Medicine) Lloyd Huger, MD as Consulting Physician (Oncology)  CHIEF COMPLAINT: Pathologic stage Ia ER positive, PR/HER-2 negative invasive carcinoma of the upper outer quadrant of the left breast.  INTERVAL HISTORY: Patient returns to clinic today for routine 6-week evaluation.  She has multiple medical complaints that are all chronic in nature.  She continues to tolerate anastrozole well without significant side effects.  She has no neurologic complaints. She denies any recent fevers or illnesses. She has a good appetite and denies weight loss. She has no chest pain, but has chronic shortness of breath.  She denies any cough or hemoptysis.  She has no nausea, vomiting, constipation, or diarrhea.  She has no urinary complaints. Patient offer no further specific complaints today.  REVIEW OF SYSTEMS:   Review of Systems  Constitutional: Negative.  Negative for fever, malaise/fatigue and weight loss.  HENT: Negative.  Negative for sore throat.   Respiratory:  Negative for cough and shortness of breath.   Cardiovascular: Negative.  Negative for chest pain and leg swelling.  Gastrointestinal: Negative.  Negative for abdominal pain, blood in stool, constipation, diarrhea, heartburn, melena, nausea and vomiting.  Genitourinary: Negative.  Negative for dysuria.  Musculoskeletal:  Positive for back pain and joint pain.  Skin: Negative.  Negative for rash.  Neurological: Negative.  Negative for dizziness, sensory change, focal weakness, weakness and headaches.  Psychiatric/Behavioral: Negative.  The patient is not nervous/anxious.    As per HPI. Otherwise, a complete review of systems is negative.  PAST MEDICAL HISTORY: Past Medical History:  Diagnosis Date   Cancer ([place]) 03/2017    left breast   Complication of anesthesia    has been slow to wake up   COPD (chronic obstructive pulmonary disease) ([place])    Diabetes mellitus without complication ([place])    Dyspnea    Edema leg 03/2017   bilateral swelling   Family history of adverse reaction to anesthesia    son had surgery and heart stopped during procedure   GERD (gastroesophageal reflux disease)    Heart murmur    no treatment   Hemorrhoids 03/2017   History of kidney stones    had eswl   Hyperlipidemia    Hypertension    Hypothyroidism    Lumbar stenosis    Personal history of radiation therapy 2018   LEFT lumpectomy   Skin cancer 2016,2017,2018   face, back of neck, shoulder, leg, hand   Sleep apnea    does not and will not use a cpap   Thyroid disease     PAST SURGICAL HISTORY: Past Surgical History:  Procedure Laterality Date   ABDOMINAL HYSTERECTOMY  1982   APPENDECTOMY  1940   BREAST BIOPSY Left 04/09/2017   [facility]   BREAST LUMPECTOMY Left 04/30/2017   [place], clear margins, negative LN   BREAST LUMPECTOMY WITH NEEDLE LOCALIZATION AND AXILLARY SENTINEL LYMPH NODE BX Left 04/30/2017   Procedure: LEFT BREAST LUMPECTOMY WITH NEEDLE LOCALIZATION AND LEFT AXILLARY SENTINEL LYMPH NODE BX;  Surgeon: Johnathan Hausen, MD;  Location: [facility] ORS;  Service: General;  Laterality: Left;   CHOLECYSTECTOMY     EYE SURGERY Bilateral 2003   cataract extraction   implants  2003   dental, not removable   JOINT REPLACEMENT Bilateral 2003,2012   total hip replacements   JOINT  REPLACEMENT Right 2014   right total knee replacement   SKIN CANCER EXCISION     left hand and right lower leg   THYROIDECTOMY, PARTIAL  2003   TONSILLECTOMY      FAMILY HISTORY: Family History  Problem Relation Age of Onset   Heart failure Mother    Heart attack Father    Diabetes Brother    Heart disease Brother    Melanoma Brother    Cancer Maternal Uncle    Diabetes Paternal Aunt    Heart disease Paternal Aunt    Heart disease  Paternal Grandmother    Diabetes Maternal Aunt    Heart attack Maternal Aunt    Diabetes Maternal Aunt    Diabetes Paternal Aunt    Heart disease Paternal Aunt    Breast cancer Neg Hx     ADVANCED DIRECTIVES (Y/N):  N  HEALTH MAINTENANCE: Social History   Tobacco Use   Smoking status: Former    Packs/day: 1.50    Types: Cigarettes    Quit date: 06/29/1985    Years since quitting: 36.3   Smokeless tobacco: Never  Vaping Use   Vaping Use: Never used  Substance Use Topics   Alcohol use: Yes    Alcohol/week: 1.0 - 2.0 standard drink    Types: 1 - 2 Standard drinks or equivalent per week    Comment: wine 3 times a week   Drug use: No     Colonoscopy:  PAP:  Bone density:  Lipid panel:  Allergies  Allergen Reactions   Elemental Sulfur Hives and Shortness Of Breath   Iodinated Contrast Media Hives, Shortness Of Breath and Other (See Comments)    Restless. Difficulty breathing. Topical betadine okay  Restless    Codeine Nausea And Vomiting    With high doses, passes out   Morphine Nausea And Vomiting    With high doses, passes out   Oxycodone Nausea And Vomiting and Other (See Comments)    Dizzy and with high doses, passes out.   Penicillins Other (See Comments)    "Hardened lump" Has patient had a PCN reaction causing immediate rash, facial/tongue/throat swelling, SOB or lightheadedness with hypotension: No Has patient had a PCN reaction causing severe rash involving mucus membranes or skin necrosis: No Has patient had a PCN reaction that required hospitalization No Has patient had a PCN reaction occurring within the last 10 years: No If all of the above answers are "NO", then may proceed with Cephalosporin use.     Sulfa Antibiotics Rash    Current Outpatient Medications  Medication Sig Dispense Refill   anastrozole (ARIMIDEX) 1 MG tablet Take 1 tablet (1 mg total) by mouth daily. 90 tablet 0   aspirin EC 81 MG EC tablet Take 1 tablet (81 mg total) by mouth  daily. 30 tablet 3   atorvastatin (LIPITOR) 20 MG tablet Take by mouth.     bisacodyl (DULCOLAX) 5 MG EC tablet Take 2 tablets (10 mg total) by mouth daily. 60 tablet 11   Coenzyme Q10 (CO Q 10 PO) Take 100 mg by mouth daily.      diltiazem (CARDIZEM CD) 300 MG 24 hr capsule Take 1 capsule (300 mg total) by mouth daily. 7 capsule 0   econazole nitrate 1 % cream Use 1 % CREAM     FARXIGA 10 MG TABS tablet Take 1 tablet by mouth daily.     levothyroxine (SYNTHROID) 50 MCG tablet Take 1 tablet (50 mcg total) by mouth daily.  30 tablet 6   losartan (COZAAR) 50 MG tablet Take 50 mg by mouth at bedtime.      metFORMIN (GLUCOPHAGE) 500 MG tablet Take 500 mg by mouth daily with breakfast.     metoprolol succinate (TOPROL-XL) 25 MG 24 hr tablet Take 1 tablet by mouth daily.     Multiple Vitamins-Minerals (PRESERVISION AREDS 2) CAPS Take 1 capsule by mouth 2 (two) times daily.      naproxen sodium (ANAPROX) 220 MG tablet Take 220-440 mg by mouth 2 (two) times daily as needed (for pain).     omeprazole (PRILOSEC) 20 MG capsule Take 20 mg by mouth daily as needed (for heartburn).      triamterene-hydrochlorothiazide (MAXZIDE-25) 37.5-25 MG per tablet Take 0.5 tablets by mouth daily.     No current facility-administered medications for this visit.    OBJECTIVE: There were no vitals filed for this visit.    There is no height or weight on file to calculate BMI.    ECOG FS:1 - Symptomatic but completely ambulatory  General: Well-developed, well-nourished, no acute distress. Eyes: Pink conjunctiva, anicteric sclera. HEENT: Normocephalic, moist mucous membranes. Breasts: Exam deferred today. Lungs: No audible wheezing or coughing. Heart: Regular rate and rhythm. Abdomen: Soft, nontender, no obvious distention. Musculoskeletal: No edema, cyanosis, or clubbing. Neuro: Alert, answering all questions appropriately. Cranial nerves grossly intact. Skin: No rashes or petechiae noted. Psych: Normal  affect.  LAB RESULTS:  Lab Results  Component Value Date   NA 139 04/22/2017   K 4.2 04/22/2017   CL 101 04/22/2017   CO2 27 04/22/2017   GLUCOSE 105 (H) 04/22/2017   BUN 16 04/22/2017   CREATININE 0.79 04/22/2017   CALCIUM 10.3 04/22/2017   PROT 7.3 11/23/2016   ALBUMIN 4.0 11/23/2016   AST 26 11/23/2016   ALT 17 11/23/2016   ALKPHOS 82 11/23/2016   BILITOT 0.6 11/23/2016   GFRNONAA >60 04/22/2017   GFRAA >60 04/22/2017    Lab Results  Component Value Date   WBC 9.2 04/22/2017   NEUTROABS 4.9 12/04/2013   HGB 14.5 04/22/2017   HCT 44.2 04/22/2017   MCV 91.3 04/22/2017   PLT 310 04/22/2017     STUDIES: No results found.  ASSESSMENT: Pathologic stage Ia ER positive, PR/HER-2 negative invasive carcinoma of the upper outer quadrant of the left breast.  PLAN:    1.  Pathologic stage Ia ER positive, PR/HER-2 negative invasive carcinoma of the upper outer quadrant of the left breast: Given the small size of patient's tumor and her advanced age, it was decided upon that chemotherapy was not necessary and Oncotype DX was not ordered.  Patient completed adjuvant XRT.  Patient discontinued letrozole secondary to side effects, but now is tolerating anastrozole well.  Continue anastrozole for a total of 5 years completing in July 2024. Her most recent mammogram on May 06, 2020 was reported as BI-RADS 2.  Repeat in November 2022.  Return to clinic in 6 months for routine evaluation.  2.  Osteopenia: Bone mineral density completed on August 13, 2020 reported T score of -1.8.  This is slightly worse than 1 year prior where the T score was reported -1.0, patient remains osteopenic.  Continue calcium and vitamin D supplementation.  Repeat in February 2023.   3.  Breast tenderness: Chronic and unchanged.  No evidence of recurrent disease.   Patient expressed understanding and was in agreement with this plan. She also understands that She can call clinic at any time with any  questions, concerns, or complaints.    Cancer Staging  Primary cancer of upper outer quadrant of left female breast [facility]) Staging form: Breast, AJCC 8th Edition - Clinical stage from 04/17/2017: Stage IA (cT1a, cN0, cM0, G2, ER+, PR-, HER2-) - Signed by Lloyd Huger, MD on 04/17/2017 Histologic grading system: 3 grade system - Pathologic stage from 05/11/2017: Stage IA (pT1b, pN0, cM0, G2, ER+, PR-, HER2-) - Signed by Lloyd Huger, MD on 05/11/2017 Neoadjuvant therapy: No Histologic grading system: 3 grade system Laterality: Left   Lloyd Huger, MD   11/01/2021 7:42 AM

## 2021-11-03 ENCOUNTER — Telehealth: Payer: Self-pay | Admitting: Oncology

## 2021-11-03 NOTE — Telephone Encounter (Signed)
ERROR

## 2021-11-04 ENCOUNTER — Inpatient Hospital Stay: Payer: PPO | Admitting: Oncology

## 2021-11-04 DIAGNOSIS — C50412 Malignant neoplasm of upper-outer quadrant of left female breast: Secondary | ICD-10-CM

## 2021-11-16 NOTE — Progress Notes (Unsigned)
[facility]  Telephone:([phone] Fax:(336) [phone]  ID: Kelsey Young OB: 1932-06-23  MR#: [id number]  UDJ#:[id number]  Patient Care Team: Perrin Maltese, MD as PCP - General (Internal Medicine) Lloyd Huger, MD as Consulting Physician (Oncology)  CHIEF COMPLAINT: Pathologic stage Ia ER positive, PR/HER-2 negative invasive carcinoma of the upper outer quadrant of the left breast.  INTERVAL HISTORY: Patient returns to clinic today for routine 6-week evaluation.  She has multiple medical complaints that are all chronic in nature.  She continues to tolerate anastrozole well without significant side effects.  She has no neurologic complaints. She denies any recent fevers or illnesses. She has a good appetite and denies weight loss. She has no chest pain, but has chronic shortness of breath.  She denies any cough or hemoptysis.  She has no nausea, vomiting, constipation, or diarrhea.  She has no urinary complaints. Patient offer no further specific complaints today.  REVIEW OF SYSTEMS:   Review of Systems  Constitutional: Negative.  Negative for fever, malaise/fatigue and weight loss.  HENT: Negative.  Negative for sore throat.   Respiratory:  Negative for cough and shortness of breath.   Cardiovascular: Negative.  Negative for chest pain and leg swelling.  Gastrointestinal: Negative.  Negative for abdominal pain, blood in stool, constipation, diarrhea, heartburn, melena, nausea and vomiting.  Genitourinary: Negative.  Negative for dysuria.  Musculoskeletal:  Positive for back pain and joint pain.  Skin: Negative.  Negative for rash.  Neurological: Negative.  Negative for dizziness, sensory change, focal weakness, weakness and headaches.  Psychiatric/Behavioral: Negative.  The patient is not nervous/anxious.    As per HPI. Otherwise, a complete review of systems is negative.  PAST MEDICAL HISTORY: Past Medical History:  Diagnosis Date   Cancer ([place]) 03/2017    left breast   Complication of anesthesia    has been slow to wake up   COPD (chronic obstructive pulmonary disease) ([place])    Diabetes mellitus without complication ([place])    Dyspnea    Edema leg 03/2017   bilateral swelling   Family history of adverse reaction to anesthesia    son had surgery and heart stopped during procedure   GERD (gastroesophageal reflux disease)    Heart murmur    no treatment   Hemorrhoids 03/2017   History of kidney stones    had eswl   Hyperlipidemia    Hypertension    Hypothyroidism    Lumbar stenosis    Personal history of radiation therapy 2018   LEFT lumpectomy   Skin cancer 2016,2017,2018   face, back of neck, shoulder, leg, hand   Sleep apnea    does not and will not use a cpap   Thyroid disease     PAST SURGICAL HISTORY: Past Surgical History:  Procedure Laterality Date   ABDOMINAL HYSTERECTOMY  1982   APPENDECTOMY  1940   BREAST BIOPSY Left 04/09/2017   [facility]   BREAST LUMPECTOMY Left 04/30/2017   [place], clear margins, negative LN   BREAST LUMPECTOMY WITH NEEDLE LOCALIZATION AND AXILLARY SENTINEL LYMPH NODE BX Left 04/30/2017   Procedure: LEFT BREAST LUMPECTOMY WITH NEEDLE LOCALIZATION AND LEFT AXILLARY SENTINEL LYMPH NODE BX;  Surgeon: Johnathan Hausen, MD;  Location: [facility] ORS;  Service: General;  Laterality: Left;   CHOLECYSTECTOMY     EYE SURGERY Bilateral 2003   cataract extraction   implants  2003   dental, not removable   JOINT REPLACEMENT Bilateral 2003,2012   total hip replacements   JOINT  REPLACEMENT Right 2014   right total knee replacement   SKIN CANCER EXCISION     left hand and right lower leg   THYROIDECTOMY, PARTIAL  2003   TONSILLECTOMY      FAMILY HISTORY: Family History  Problem Relation Age of Onset   Heart failure Mother    Heart attack Father    Diabetes Brother    Heart disease Brother    Melanoma Brother    Cancer Maternal Uncle    Diabetes Paternal Aunt    Heart disease Paternal Aunt    Heart disease  Paternal Grandmother    Diabetes Maternal Aunt    Heart attack Maternal Aunt    Diabetes Maternal Aunt    Diabetes Paternal Aunt    Heart disease Paternal Aunt    Breast cancer Neg Hx     ADVANCED DIRECTIVES (Y/N):  N  HEALTH MAINTENANCE: Social History   Tobacco Use   Smoking status: Former    Packs/day: 1.50    Types: Cigarettes    Quit date: 06/29/1985    Years since quitting: 36.4   Smokeless tobacco: Never  Vaping Use   Vaping Use: Never used  Substance Use Topics   Alcohol use: Yes    Alcohol/week: 1.0 - 2.0 standard drink    Types: 1 - 2 Standard drinks or equivalent per week    Comment: wine 3 times a week   Drug use: No     Colonoscopy:  PAP:  Bone density:  Lipid panel:  Allergies  Allergen Reactions   Elemental Sulfur Hives and Shortness Of Breath   Iodinated Contrast Media Hives, Shortness Of Breath and Other (See Comments)    Restless. Difficulty breathing. Topical betadine okay  Restless    Codeine Nausea And Vomiting    With high doses, passes out   Morphine Nausea And Vomiting    With high doses, passes out   Oxycodone Nausea And Vomiting and Other (See Comments)    Dizzy and with high doses, passes out.   Penicillins Other (See Comments)    "Hardened lump" Has patient had a PCN reaction causing immediate rash, facial/tongue/throat swelling, SOB or lightheadedness with hypotension: No Has patient had a PCN reaction causing severe rash involving mucus membranes or skin necrosis: No Has patient had a PCN reaction that required hospitalization No Has patient had a PCN reaction occurring within the last 10 years: No If all of the above answers are "NO", then may proceed with Cephalosporin use.     Sulfa Antibiotics Rash    Current Outpatient Medications  Medication Sig Dispense Refill   anastrozole (ARIMIDEX) 1 MG tablet Take 1 tablet (1 mg total) by mouth daily. 90 tablet 0   aspirin EC 81 MG EC tablet Take 1 tablet (81 mg total) by mouth  daily. 30 tablet 3   atorvastatin (LIPITOR) 20 MG tablet Take by mouth.     bisacodyl (DULCOLAX) 5 MG EC tablet Take 2 tablets (10 mg total) by mouth daily. 60 tablet 11   Coenzyme Q10 (CO Q 10 PO) Take 100 mg by mouth daily.      diltiazem (CARDIZEM CD) 300 MG 24 hr capsule Take 1 capsule (300 mg total) by mouth daily. 7 capsule 0   econazole nitrate 1 % cream Use 1 % CREAM     FARXIGA 10 MG TABS tablet Take 1 tablet by mouth daily.     levothyroxine (SYNTHROID) 50 MCG tablet Take 1 tablet (50 mcg total) by mouth daily.  30 tablet 6   losartan (COZAAR) 50 MG tablet Take 50 mg by mouth at bedtime.      metFORMIN (GLUCOPHAGE) 500 MG tablet Take 500 mg by mouth daily with breakfast.     metoprolol succinate (TOPROL-XL) 25 MG 24 hr tablet Take 1 tablet by mouth daily.     Multiple Vitamins-Minerals (PRESERVISION AREDS 2) CAPS Take 1 capsule by mouth 2 (two) times daily.      naproxen sodium (ANAPROX) 220 MG tablet Take 220-440 mg by mouth 2 (two) times daily as needed (for pain).     omeprazole (PRILOSEC) 20 MG capsule Take 20 mg by mouth daily as needed (for heartburn).      triamterene-hydrochlorothiazide (MAXZIDE-25) 37.5-25 MG per tablet Take 0.5 tablets by mouth daily.     No current facility-administered medications for this visit.    OBJECTIVE: There were no vitals filed for this visit.    There is no height or weight on file to calculate BMI.    ECOG FS:1 - Symptomatic but completely ambulatory  General: Well-developed, well-nourished, no acute distress. Eyes: Pink conjunctiva, anicteric sclera. HEENT: Normocephalic, moist mucous membranes. Breasts: Exam deferred today. Lungs: No audible wheezing or coughing. Heart: Regular rate and rhythm. Abdomen: Soft, nontender, no obvious distention. Musculoskeletal: No edema, cyanosis, or clubbing. Neuro: Alert, answering all questions appropriately. Cranial nerves grossly intact. Skin: No rashes or petechiae noted. Psych: Normal  affect.  LAB RESULTS:  Lab Results  Component Value Date   NA 139 04/22/2017   K 4.2 04/22/2017   CL 101 04/22/2017   CO2 27 04/22/2017   GLUCOSE 105 (H) 04/22/2017   BUN 16 04/22/2017   CREATININE 0.79 04/22/2017   CALCIUM 10.3 04/22/2017   PROT 7.3 11/23/2016   ALBUMIN 4.0 11/23/2016   AST 26 11/23/2016   ALT 17 11/23/2016   ALKPHOS 82 11/23/2016   BILITOT 0.6 11/23/2016   GFRNONAA >60 04/22/2017   GFRAA >60 04/22/2017    Lab Results  Component Value Date   WBC 9.2 04/22/2017   NEUTROABS 4.9 12/04/2013   HGB 14.5 04/22/2017   HCT 44.2 04/22/2017   MCV 91.3 04/22/2017   PLT 310 04/22/2017     STUDIES: No results found.  ASSESSMENT: Pathologic stage Ia ER positive, PR/HER-2 negative invasive carcinoma of the upper outer quadrant of the left breast.  PLAN:    1.  Pathologic stage Ia ER positive, PR/HER-2 negative invasive carcinoma of the upper outer quadrant of the left breast: Given the small size of patient's tumor and her advanced age, it was decided upon that chemotherapy was not necessary and Oncotype DX was not ordered.  Patient completed adjuvant XRT.  Patient discontinued letrozole secondary to side effects, but now is tolerating anastrozole well.  Continue anastrozole for a total of 5 years completing in July 2024. Her most recent mammogram on May 06, 2020 was reported as BI-RADS 2.  Repeat in November 2022.  Return to clinic in 6 months for routine evaluation.  2.  Osteopenia: Bone mineral density completed on August 13, 2020 reported T score of -1.8.  This is slightly worse than 1 year prior where the T score was reported -1.0, patient remains osteopenic.  Continue calcium and vitamin D supplementation.  Repeat in February 2023.   3.  Breast tenderness: Chronic and unchanged.  No evidence of recurrent disease.   Patient expressed understanding and was in agreement with this plan. She also understands that She can call clinic at any time with any  questions, concerns, or complaints.    Cancer Staging  Primary cancer of upper outer quadrant of left female breast [facility]) Staging form: Breast, AJCC 8th Edition - Clinical stage from 04/17/2017: Stage IA (cT1a, cN0, cM0, G2, ER+, PR-, HER2-) - Signed by Lloyd Huger, MD on 04/17/2017 Histologic grading system: 3 grade system - Pathologic stage from 05/11/2017: Stage IA (pT1b, pN0, cM0, G2, ER+, PR-, HER2-) - Signed by Lloyd Huger, MD on 05/11/2017 Neoadjuvant therapy: No Histologic grading system: 3 grade system Laterality: Left   Lloyd Huger, MD   11/16/2021 6:43 AM

## 2021-11-18 ENCOUNTER — Inpatient Hospital Stay: Payer: PPO | Attending: Oncology | Admitting: Oncology

## 2021-11-18 ENCOUNTER — Encounter: Payer: Self-pay | Admitting: Oncology

## 2021-11-18 VITALS — BP 146/71 | HR 78 | Temp 98.3°F | Resp 16 | Ht 59.0 in | Wt 176.0 lb

## 2021-11-18 DIAGNOSIS — Z87891 Personal history of nicotine dependence: Secondary | ICD-10-CM | POA: Diagnosis not present

## 2021-11-18 DIAGNOSIS — Z79811 Long term (current) use of aromatase inhibitors: Secondary | ICD-10-CM

## 2021-11-18 DIAGNOSIS — M8580 Other specified disorders of bone density and structure, unspecified site: Secondary | ICD-10-CM | POA: Diagnosis not present

## 2021-11-18 DIAGNOSIS — Z170 Estrogen receptor positive status [ER+]: Secondary | ICD-10-CM | POA: Diagnosis not present

## 2021-11-18 DIAGNOSIS — Z79899 Other long term (current) drug therapy: Secondary | ICD-10-CM | POA: Diagnosis not present

## 2021-11-18 DIAGNOSIS — C50412 Malignant neoplasm of upper-outer quadrant of left female breast: Secondary | ICD-10-CM | POA: Diagnosis not present

## 2021-11-18 DIAGNOSIS — Z923 Personal history of irradiation: Secondary | ICD-10-CM | POA: Insufficient documentation

## 2021-12-02 ENCOUNTER — Other Ambulatory Visit: Payer: Self-pay | Admitting: Oncology

## 2021-12-02 ENCOUNTER — Ambulatory Visit
Admission: RE | Admit: 2021-12-02 | Discharge: 2021-12-02 | Disposition: A | Discharge status: Home / Self Care | Payer: PPO | Source: Ambulatory Visit | Attending: Oncology | Admitting: Oncology

## 2021-12-02 DIAGNOSIS — C50412 Malignant neoplasm of upper-outer quadrant of left female breast: Secondary | ICD-10-CM | POA: Diagnosis not present

## 2021-12-02 DIAGNOSIS — Z79811 Long term (current) use of aromatase inhibitors: Secondary | ICD-10-CM | POA: Insufficient documentation

## 2022-01-20 ENCOUNTER — Other Ambulatory Visit: Payer: Self-pay

## 2022-01-20 DIAGNOSIS — C50412 Malignant neoplasm of upper-outer quadrant of left female breast: Secondary | ICD-10-CM

## 2022-01-20 MED ORDER — ANASTROZOLE 1 MG PO TABS: 1.0000 mg | ORAL_TABLET | Freq: Every day | ORAL | 3 refills | Status: DC

## 2022-01-20 NOTE — Telephone Encounter (Signed)
Patient needs refills on anastrozole sent to [facility].

## 2022-05-11 ENCOUNTER — Ambulatory Visit: Payer: PPO | Admitting: Nurse Practitioner

## 2022-05-12 ENCOUNTER — Other Ambulatory Visit: Payer: PPO

## 2022-05-12 ENCOUNTER — Ambulatory Visit
Admission: RE | Admit: 2022-05-12 | Discharge: 2022-05-12 | Disposition: A | Discharge status: Home / Self Care | Payer: PPO | Source: Ambulatory Visit | Attending: Oncology | Admitting: Oncology

## 2022-05-12 DIAGNOSIS — C50412 Malignant neoplasm of upper-outer quadrant of left female breast: Secondary | ICD-10-CM | POA: Insufficient documentation

## 2022-05-12 DIAGNOSIS — Z1231 Encounter for screening mammogram for malignant neoplasm of breast: Secondary | ICD-10-CM | POA: Insufficient documentation

## 2022-05-12 DIAGNOSIS — Z79811 Long term (current) use of aromatase inhibitors: Secondary | ICD-10-CM | POA: Insufficient documentation

## 2022-05-12 HISTORY — DX: Malignant neoplasm of unspecified site of unspecified female breast: C50.919

## 2022-05-19 ENCOUNTER — Ambulatory Visit: Payer: PPO | Admitting: Oncology

## 2022-05-19 ENCOUNTER — Encounter: Payer: Self-pay | Admitting: Nurse Practitioner

## 2022-05-19 ENCOUNTER — Inpatient Hospital Stay: Payer: PPO | Attending: Oncology | Admitting: Nurse Practitioner

## 2022-05-19 VITALS — BP 153/80 | HR 96 | Temp 97.2°F | Ht 59.0 in | Wt 175.0 lb

## 2022-05-19 DIAGNOSIS — K5909 Other constipation: Secondary | ICD-10-CM | POA: Diagnosis not present

## 2022-05-19 DIAGNOSIS — Z79899 Other long term (current) drug therapy: Secondary | ICD-10-CM | POA: Insufficient documentation

## 2022-05-19 DIAGNOSIS — Z5181 Encounter for therapeutic drug level monitoring: Secondary | ICD-10-CM

## 2022-05-19 DIAGNOSIS — N6459 Other signs and symptoms in breast: Secondary | ICD-10-CM | POA: Diagnosis not present

## 2022-05-19 DIAGNOSIS — C50412 Malignant neoplasm of upper-outer quadrant of left female breast: Secondary | ICD-10-CM | POA: Insufficient documentation

## 2022-05-19 DIAGNOSIS — M549 Dorsalgia, unspecified: Secondary | ICD-10-CM | POA: Insufficient documentation

## 2022-05-19 DIAGNOSIS — K5900 Constipation, unspecified: Secondary | ICD-10-CM

## 2022-05-19 DIAGNOSIS — Z170 Estrogen receptor positive status [ER+]: Secondary | ICD-10-CM | POA: Insufficient documentation

## 2022-05-19 DIAGNOSIS — Z79811 Long term (current) use of aromatase inhibitors: Secondary | ICD-10-CM | POA: Insufficient documentation

## 2022-05-19 DIAGNOSIS — G8929 Other chronic pain: Secondary | ICD-10-CM | POA: Insufficient documentation

## 2022-05-19 DIAGNOSIS — Z923 Personal history of irradiation: Secondary | ICD-10-CM | POA: Diagnosis not present

## 2022-05-19 DIAGNOSIS — Z853 Personal history of malignant neoplasm of breast: Secondary | ICD-10-CM

## 2022-05-19 DIAGNOSIS — Z08 Encounter for follow-up examination after completed treatment for malignant neoplasm: Secondary | ICD-10-CM | POA: Diagnosis not present

## 2022-05-19 DIAGNOSIS — M8580 Other specified disorders of bone density and structure, unspecified site: Secondary | ICD-10-CM | POA: Diagnosis not present

## 2022-05-19 DIAGNOSIS — Z87891 Personal history of nicotine dependence: Secondary | ICD-10-CM | POA: Diagnosis not present

## 2022-05-19 NOTE — Patient Instructions (Signed)
If you experience constipation, please take stool softeners such as Senna and Miralax every day to avoid constipation.  These medications are available over the counter.  Of course, if you have diarrhea, stop taking stool softeners.  Drinking plenty of fluid, eating fruits and vegetable, and being active also reduces the risk of constipation.   If despite taking stool softeners, and you still have no bowel movement for 2 days or more than your normal bowel habit frequency, please take one of the following over the counter laxatives:  Milk of Magnesia or Mag Citrate everyday and contact me immediately for further instructions.  The goal is to have at least one bowel movement every day or every other day without pain or straining.   We discussed the role of insoluble fiber in preventing constipation. Insoluble fiber doesn't change when it gets wet or doesn't become 'sticky' like soluble fiber does. Foods such as fruits and vegetable are high in insoluble fiber and can help prevent constipation.   If despite improvement in your constipation, you have pelvic floor complaints, we could consider evaluation with pelvic floor physical therapist.   It was a pleasure meeting you today and thank you for allowing me to participate in your care. -Beckey Rutter, NP

## 2022-05-19 NOTE — Progress Notes (Signed)
[facility]  Telephone:(336) [phone] Fax:(336) [phone]  ID: Kelsey Young OB: 1932-01-09  MR#: [id number]  OZD#:[id number]  Patient Care Team: Perrin Maltese, MD as PCP - General (Internal Medicine) Lloyd Huger, MD as Consulting Physician (Oncology)  CHIEF COMPLAINT: Pathologic stage Ia ER positive, PR/HER-2 negative invasive carcinoma of the upper outer quadrant of the left breast.  INTERVAL HISTORY: Patient returns to clinic today for routine 81-monthevaluation.She continues anastrozole well without significant complaints. No breast pain. Has chronic tenderness with movements which isn't worse. No falls or fractures. Recently saw Dr. CWindell Momentfor bleeding hemorrhoids. She has chronic back pain and is considering seeing neurosurgery. Has chronic constipation and has seen GGerarda Gunther   REVIEW OF SYSTEMS:   Review of Systems  Constitutional:  Negative for chills, fever, malaise/fatigue and weight loss.  HENT:  Negative for congestion, ear discharge, ear pain, sinus pain, sore throat and tinnitus.   Respiratory:  Negative for cough, sputum production and shortness of breath.   Cardiovascular:  Negative for chest pain, palpitations, orthopnea, claudication and leg swelling.  Gastrointestinal:  Positive for constipation. Negative for abdominal pain, blood in stool, diarrhea, heartburn, nausea and vomiting.  Genitourinary: Negative.  Negative for dysuria and urgency.  Musculoskeletal:  Positive for back pain and joint pain. Negative for falls.  Skin:  Negative for rash.  Neurological:  Negative for dizziness, tingling, weakness and headaches.  Endo/Heme/Allergies:  Does not bruise/bleed easily.  Psychiatric/Behavioral:  Negative for depression. The patient is not nervous/anxious.   As per HPI. Otherwise, a complete review of systems is negative.  PAST MEDICAL HISTORY: Past Medical History:  Diagnosis Date   [place]    [place] 03[place]    left breast   Complication of anesthesia    has been slow to wake up   [place]    Di[place]    Dyspnea    Edema leg 03/2017   bilateral swelling   Family history of adverse reaction to anesthesia    son had surgery and heart stopped during procedure   GERD (gastroesophageal reflux disease)    Heart murmur    no treatment   Hemorrhoids 03/2017   History of kidney stones    had eswl   Hyperlipidemia    Hypertension    Hypothyroidism    Lumbar stenosis    Personal history of radiation therapy 2018   LEFT lumpectomy   Skin cancer 2016,2017,2018   face, back of neck, shoulder, leg, hand   Sleep apnea    does not and will not use a cpap   Thyroid disease     PAST SURGICAL HISTORY: Past Surgical History:  Procedure Laterality Date   ABDOMINAL HYSTERECTOMY  1982   APPENDECTOMY  1940   [facility]  BREAST LUMPECTOMY Left 04/30/2017   [place] clear [place], negative LN   BREAST LUMPECTOMY WITH NEEDLE LOCALIZATION AND AXILLARY SENTINEL LYMPH NODE BX Left 04/30/2017   Procedure: LEFT BREAST LUMPECTOMY WITH NEEDLE LOCALIZATION AND LEFT AXILLARY SENTINEL LYMPH NODE BX;  Surgeon: MJohnathan Hausen MD;  Location: [facility] ORS;  Service: General;  Laterality: Left;   CHOLECYSTECTOMY     EYE SURGERY Bilateral 2003   cataract extraction   implants  2003   dental, not removable   JOINT REPLACEMENT Bilateral 2003,2012   total hip replacements   JOINT REPLACEMENT Right 2014   right total knee replacement  SKIN CANCER EXCISION     left hand and right lower leg   THYROIDECTOMY, PARTIAL  2003   TONSILLECTOMY      FAMILY HISTORY: Family History  Problem Relation Age of Onset   Heart failure Mother    Heart attack Father    Diabetes Brother    Heart disease Brother    Melanoma Brother    Cancer Maternal Uncle    Diabetes Paternal Aunt    Heart disease Paternal Aunt    Heart disease  Paternal Grandmother    Diabetes Maternal Aunt    Heart attack Maternal Aunt    Diabetes Maternal Aunt    Diabetes Paternal Aunt    Heart disease Paternal Aunt    Breast cancer Neg Hx     ADVANCED DIRECTIVES (Y/N):  N  HEALTH MAINTENANCE: Social History   Tobacco Use   Smoking status: Former    Packs/day: 1.50    Types: Cigarettes    Quit date: 06/29/1985    Years since quitting: 36.9   Smokeless tobacco: Never  Vaping Use   Vaping Use: Never used  Substance Use Topics   Alcohol use: Yes    Alcohol/week: 1.0 - 2.0 standard drink of alcohol    Types: 1 - 2 Standard drinks or equivalent per week    Comment: wine 3 times a week   Drug use: No     Colonoscopy:  PAP:  Bone density:  Lipid panel:  Allergies  Allergen Reactions   Elemental Sulfur Hives and Shortness Of Breath   Iodinated Contrast Media Hives, Shortness Of Breath and Other (See Comments)    Restless. Difficulty breathing. Topical betadine okay  Restless    Codeine Nausea And Vomiting    With high doses, passes out   Morphine Nausea And Vomiting    With high doses, passes out   Oxycodone Nausea And Vomiting and Other (See Comments)    Dizzy and with high doses, passes out.   Penicillins Other (See Comments)    "Hardened lump" Has patient had a PCN reaction causing immediate rash, facial/tongue/throat swelling, SOB or lightheadedness with hypotension: No Has patient had a PCN reaction causing severe rash involving mucus membranes or skin necrosis: No Has patient had a PCN reaction that required hospitalization No Has patient had a PCN reaction occurring within the last 10 years: No If all of the above answers are "NO", then may proceed with Cephalosporin use.     Sulfa Antibiotics Rash    Current Outpatient Medications  Medication Sig Dispense Refill   acetaminophen (TYLENOL) 500 MG tablet Take by mouth.     anastrozole (ARIMIDEX) 1 MG tablet Take 1 tablet (1 mg total) by mouth daily. 90 tablet 3    aspirin EC 81 MG EC tablet Take 1 tablet (81 mg total) by mouth daily. 30 tablet 3   atorvastatin (LIPITOR) 20 MG tablet Take by mouth.     bisacodyl (DULCOLAX) 5 MG EC tablet Take 2 tablets (10 mg total) by mouth daily. 60 tablet 11   Coenzyme Q10 (CO Q 10 PO) Take 100 mg by mouth daily.      diltiazem (CARDIZEM CD) 300 MG 24 hr capsule Take 1 capsule (300 mg total) by mouth daily. 7 capsule 0   econazole nitrate 1 % cream Use 1 % CREAM     FARXIGA 10 MG TABS tablet Take 1 tablet by mouth daily.     losartan (COZAAR) 50 MG tablet Take 50 mg by mouth at  bedtime.      metoprolol succinate (TOPROL-XL) 25 MG 24 hr tablet Take 1 tablet by mouth daily.     omeprazole (PRILOSEC) 20 MG capsule Take 20 mg by mouth daily as needed (for heartburn).      triamterene-hydrochlorothiazide (MAXZIDE-25) 37.5-25 MG per tablet Take 0.5 tablets by mouth daily.     levothyroxine (SYNTHROID) 50 MCG tablet Take 1 tablet (50 mcg total) by mouth daily. 30 tablet 6   metFORMIN (GLUCOPHAGE) 500 MG tablet Take 500 mg by mouth daily with breakfast. (Patient not taking: Reported on 11/18/2021)     Multiple Vitamins-Minerals (PRESERVISION AREDS 2) CAPS Take 1 capsule by mouth 2 (two) times daily.  (Patient not taking: Reported on 11/18/2021)     naproxen sodium (ANAPROX) 220 MG tablet Take 220-440 mg by mouth 2 (two) times daily as needed (for pain). (Patient not taking: Reported on 11/18/2021)     No current facility-administered medications for this visit.    OBJECTIVE: Vitals:   05/19/22 1352  BP: (!) 153/80  Pulse: 96  Temp: (!) 97.2 F (36.2 C)  SpO2: 96%     Body mass index is 35.35 kg/m.    ECOG FS:2 - Symptomatic, <50% confined to bed  General: Well-developed, well-nourished, no acute distress. Eyes: Pink conjunctiva, anicteric sclera. Lungs: Clear to auscultation bilaterally.  No audible wheezing or coughing Heart: Regular rate and rhythm.  Abdomen: Soft, nontender, nondistended.  Musculoskeletal: No  edema, cyanosis, or clubbing. Neuro: Alert, answering all questions appropriately. Cranial nerves grossly intact. Skin: No rashes or petechiae noted. Psych: Normal affect. Breast: declined exam today   LAB RESULTS:  Lab Results  Component Value Date   NA 139 04/22/2017   K 4.2 04/22/2017   CL 101 04/22/2017   CO2 27 04/22/2017   GLUCOSE 105 (H) 04/22/2017   BUN 16 04/22/2017   CREATININE 0.79 04/22/2017   CALCIUM 10.3 04/22/2017   PROT 7.3 11/23/2016   ALBUMIN 4.0 11/23/2016   AST 26 11/23/2016   ALT 17 11/23/2016   ALKPHOS 82 11/23/2016   BILITOT 0.6 11/23/2016   GFRNONAA >60 04/22/2017   GFRAA >60 04/22/2017    Lab Results  Component Value Date   WBC 9.2 04/22/2017   NEUTROABS 4.9 12/04/2013   HGB 14.5 04/22/2017   HCT 44.2 04/22/2017   MCV 91.3 04/22/2017   PLT 310 04/22/2017     STUDIES: MM 3D SCREEN BREAST BILATERAL  Result Date: 05/13/2022 CLINICAL DATA:  Screening. EXAM: DIGITAL SCREENING BILATERAL MAMMOGRAM WITH TOMOSYNTHESIS AND CAD TECHNIQUE: Bilateral screening digital craniocaudal and mediolateral oblique mammograms were obtained. Bilateral screening digital breast tomosynthesis was performed. The images were evaluated with computer-aided detection. COMPARISON:  Previous exam(s). ACR Breast Density Category b: There are scattered areas of fibroglandular density. FINDINGS: There are no findings suspicious for malignancy. IMPRESSION: No mammographic evidence of malignancy. A result letter of this screening mammogram will be mailed directly to the patient. RECOMMENDATION: Screening mammogram in one year. (Code:SM-B-01Y) BI-RADS CATEGORY  1: Negative. Electronically Signed   By: Lillia Mountain M.D.   On: 05/13/2022 17:16    ASSESSMENT: Pathologic stage Ia ER positive, PR/HER-2 negative invasive carcinoma of the upper outer quadrant of the left breast.  PLAN:    1.  Pathologic stage Ia ER positive, PR/HER-2 negative invasive carcinoma of the upper outer quadrant  of the left breast: Given the small size of patient's tumor and her advanced age, chemotherapy was not necessary and oncotype DX was not ordered. She completed adjuvant  XRT. Initially on letrozole but discontinued d/t side effects. Now on anastrozole which she tolerates well. Plan to complete 5 years of adjuvant therapy completing treatment in July 2024. Her most recent  mammogram on November 15th, 2023 which was reported as bi-rads 1. Repeat in November 2024. She will continue surveillance visits every 6 months then at 5 years can transition to annual visits. Return to clinic 1 to 2 days after her mammogram.   2.  Osteopenia: Bone mineral density completed on August 13, 2020 reported T score of -1.8.  This is slightly worse than 1 year prior where the T score was reported -1.0. Repeat bone density 12/02/21 is worse, now bone density is -2.3, however still osteopenic. Discussed options including rotating to tamoxifen vs bisphosphonates vs observation. Reviewed that AIs can worsen bone density. However, she is due to complete treatment in July. Her preference for now is observation but if bone density worsens next year she may consider treatment with bisphosphonates. For now, continue calcium 1200 mg and vitamin d 1000 iu daily. I encouraged physical activity when possible. Plan to repeat in June 2024.   3.  Breast tenderness: Chronic and unchanged. 4. Constipation- encouraged her to discuss with GI who is following her for this but also discussed use of otc medications as well as role of fiber- soluble vs insoluble.  5. Chronic back pain- chronic. Followed by Dr. Sharlet Salina and [facility]: [facility] Rhonda W. Considering eval with Dr. Izora Ribas.  6. Hemorrhoids- followed by Dr. Windell Moment.   Disposition:  6 mo- see me or [facility] for breast cancer surveillance- [place]   Patient expressed understanding and was in agreement with this plan. She also understands that She can call clinic at any time with any questions, concerns, or  complaints.    Cancer Staging  Primary cancer of upper outer quadrant of left female breast [facility]) Staging form: Breast, AJCC 8th Edition - Clinical stage from 04/17/2017: Stage IA (cT1a, cN0, cM0, G2, ER+, PR-, HER2-) - Signed by Lloyd Huger, MD on 04/17/2017 Histologic grading system: 3 grade system - Pathologic stage from 05/11/2017: Stage IA (pT1b, pN0, cM0, G2, ER+, PR-, HER2-) - Signed by Lloyd Huger, MD on 05/11/2017 Neoadjuvant therapy: No Histologic grading system: 3 grade system Laterality: Left  Verlon Au, NP   05/19/2022

## 2022-09-18 ENCOUNTER — Other Ambulatory Visit: Payer: Self-pay

## 2022-09-18 MED ORDER — TRIAMTERENE-HCTZ 37.5-25 MG PO TABS: 0.5000 | ORAL_TABLET | Freq: Every day | ORAL | 3 refills | Status: DC

## 2022-10-01 ENCOUNTER — Other Ambulatory Visit: Payer: Self-pay

## 2022-10-01 MED ORDER — LEVOFLOXACIN 500 MG PO TABS: 500.0000 mg | ORAL_TABLET | Freq: Every day | ORAL | 0 refills | Status: DC

## 2022-10-02 ENCOUNTER — Ambulatory Visit: Payer: PPO | Admitting: Family

## 2022-10-19 ENCOUNTER — Encounter: Payer: Self-pay | Admitting: Family

## 2022-10-21 ENCOUNTER — Other Ambulatory Visit: Payer: PPO

## 2022-10-21 DIAGNOSIS — E559 Vitamin D deficiency, unspecified: Secondary | ICD-10-CM

## 2022-10-21 DIAGNOSIS — E039 Hypothyroidism, unspecified: Secondary | ICD-10-CM

## 2022-10-21 DIAGNOSIS — E782 Mixed hyperlipidemia: Secondary | ICD-10-CM

## 2022-10-21 DIAGNOSIS — E1165 Type 2 diabetes mellitus with hyperglycemia: Secondary | ICD-10-CM

## 2022-10-22 ENCOUNTER — Ambulatory Visit (INDEPENDENT_AMBULATORY_CARE_PROVIDER_SITE_OTHER): Payer: PPO | Admitting: Family

## 2022-10-22 VITALS — BP 128/64 | HR 100 | Ht 59.0 in | Wt 180.6 lb

## 2022-10-22 DIAGNOSIS — C50412 Malignant neoplasm of upper-outer quadrant of left female breast: Secondary | ICD-10-CM | POA: Diagnosis not present

## 2022-10-22 DIAGNOSIS — Z1389 Encounter for screening for other disorder: Secondary | ICD-10-CM

## 2022-10-22 DIAGNOSIS — E1165 Type 2 diabetes mellitus with hyperglycemia: Secondary | ICD-10-CM

## 2022-10-22 DIAGNOSIS — E782 Mixed hyperlipidemia: Secondary | ICD-10-CM | POA: Diagnosis not present

## 2022-10-22 LAB — HEMOGLOBIN A1C
Est. average glucose Bld gHb Est-mCnc: 131 mg/dL
Hgb A1c MFr Bld: 6.2 % — ABNORMAL HIGH (ref 4.8–5.6)

## 2022-10-22 LAB — CMP14+EGFR
ALT: 15 IU/L (ref 0–32)
AST: 19 IU/L (ref 0–40)
Albumin/Globulin Ratio: 1.7 (ref 1.2–2.2)
Albumin: 4.2 g/dL (ref 3.7–4.7)
Alkaline Phosphatase: 119 IU/L (ref 44–121)
BUN/Creatinine Ratio: 30 — ABNORMAL HIGH (ref 12–28)
BUN: 25 mg/dL (ref 8–27)
Bilirubin Total: 0.4 mg/dL (ref 0.0–1.2)
CO2: 23 mmol/L (ref 20–29)
Calcium: 10.1 mg/dL (ref 8.7–10.3)
Chloride: 101 mmol/L (ref 96–106)
Creatinine, Ser: 0.82 mg/dL (ref 0.57–1.00)
Globulin, Total: 2.5 g/dL (ref 1.5–4.5)
Glucose: 118 mg/dL — ABNORMAL HIGH (ref 70–99)
Potassium: 4.3 mmol/L (ref 3.5–5.2)
Sodium: 140 mmol/L (ref 134–144)
Total Protein: 6.7 g/dL (ref 6.0–8.5)
eGFR: 69 mL/min/{1.73_m2} (ref >59)

## 2022-10-22 LAB — POCT URINALYSIS DIPSTICK
Bilirubin, UA: NEGATIVE
Blood, UA: NEGATIVE
Glucose, UA: POSITIVE — AB
Ketones, UA: NEGATIVE
Leukocytes, UA: NEGATIVE
Nitrite, UA: NEGATIVE
Protein, UA: NEGATIVE
Spec Grav, UA: 1.025 (ref 1.010–1.025)
Urobilinogen, UA: 0.2 E.U./dL
pH, UA: 6 (ref 5.0–8.0)

## 2022-10-22 LAB — CBC WITH DIFFERENTIAL
Basophils Absolute: 0.1 10*3/uL (ref 0.0–0.2)
Basos: 1 %
EOS (ABSOLUTE): 0.2 10*3/uL (ref 0.0–0.4)
Eos: 3 %
Hematocrit: 48.3 % — ABNORMAL HIGH (ref 34.0–46.6)
Hemoglobin: 15.6 g/dL (ref 11.1–15.9)
Immature Grans (Abs): 0 10*3/uL (ref 0.0–0.1)
Immature Granulocytes: 0 %
Lymphocytes Absolute: 1.9 10*3/uL (ref 0.7–3.1)
Lymphs: 29 %
MCH: 29.9 pg (ref 26.6–33.0)
MCHC: 32.3 g/dL (ref 31.5–35.7)
MCV: 93 fL (ref 79–97)
Monocytes Absolute: 0.6 10*3/uL (ref 0.1–0.9)
Monocytes: 9 %
Neutrophils Absolute: 3.9 10*3/uL (ref 1.4–7.0)
Neutrophils: 58 %
RBC: 5.22 x10E6/uL (ref 3.77–5.28)
RDW: 12.3 % (ref 11.7–15.4)
WBC: 6.6 10*3/uL (ref 3.4–10.8)

## 2022-10-22 LAB — LIPID PANEL
Chol/HDL Ratio: 3.3 ratio (ref 0.0–4.4)
Cholesterol, Total: 151 mg/dL (ref 100–199)
HDL: 46 mg/dL (ref >39)
LDL Chol Calc (NIH): 86 mg/dL (ref 0–99)
Triglycerides: 103 mg/dL (ref 0–149)
VLDL Cholesterol Cal: 19 mg/dL (ref 5–40)

## 2022-10-22 LAB — VITAMIN D 25 HYDROXY (VIT D DEFICIENCY, FRACTURES): Vit D, 25-Hydroxy: 27.9 ng/mL — ABNORMAL LOW (ref 30.0–100.0)

## 2022-10-22 LAB — POC CREATINE & ALBUMIN,URINE
Albumin/Creatinine Ratio, Urine, POC: 30
Creatinine, POC: 200 mg/dL
Microalbumin Ur, POC: 30 mg/L

## 2022-10-22 LAB — TSH: TSH: 1.84 u[IU]/mL (ref 0.450–4.500)

## 2022-10-22 LAB — POCT CBG (FASTING - GLUCOSE)-MANUAL ENTRY: Glucose Fasting, POC: 102 mg/dL — AB (ref 70–99)

## 2022-10-25 ENCOUNTER — Encounter: Payer: Self-pay | Admitting: Family

## 2022-10-25 NOTE — Progress Notes (Signed)
Established Patient Office Visit  Subjective:  Patient ID: Kelsey Young, female    DOB: 01/25/1932  Age: 89 y.o. MRN: [id number]  Chief Complaint  Patient presents with   Follow-up    3 month follow up    Patient is here today for her 3 months follow up.  She has been feeling well since last appointment.   She does have additional concerns to discuss today.  She is still having additional issues with her constipation and her hemorrhoids.  She also brought her freestyle today so we can review her trends.  In the last 90 days, she is spending at least 95% of her time within target.   Labs were drawn previously, so we will review these today.  She needs refills.   I have reviewed her active problem list, medication list, allergies, notes from last encounter, lab results for her appointment today.      No other concerns at this time.   Past Medical History:  Diagnosis Date   Breast cancer (HCC)    Cancer (HCC) 03/2017   left breast   Chest pain, rule out acute myocardial infarction 11/23/2016   Complication of anesthesia    has been slow to wake up   COPD (chronic obstructive pulmonary disease) (HCC)    Diabetes mellitus without complication (HCC)    Dyspnea    Edema leg 03/2017   bilateral swelling   Family history of adverse reaction to anesthesia    son had surgery and heart stopped during procedure   GERD (gastroesophageal reflux disease)    Heart murmur    no treatment   Hemorrhoids 03/2017   History of kidney stones    had eswl   Hyperlipidemia    Hypertension    Hyponatremia 11/25/2016   Hypothyroidism    Lumbar stenosis    Nausea 08/23/2019   Palpitations 11/25/2016   Personal history of radiation therapy 2018   LEFT lumpectomy   Skin cancer 2016,2017,2018   face, back of neck, shoulder, leg, hand   Sleep apnea    does not and will not use a cpap   Thyroid disease     Past Surgical History:  Procedure Laterality Date   ABDOMINAL HYSTERECTOMY   1982   APPENDECTOMY  1940   BREAST BIOPSY Left 04/09/2017   [facility]   BREAST LUMPECTOMY Left 04/30/2017   [facility], clear margins, negative LN   BREAST LUMPECTOMY WITH NEEDLE LOCALIZATION AND AXILLARY SENTINEL LYMPH NODE BX Left 04/30/2017   Procedure: LEFT BREAST LUMPECTOMY WITH NEEDLE LOCALIZATION AND LEFT AXILLARY SENTINEL LYMPH NODE BX;  Surgeon: Luretha Murphy, MD;  Location: [facility] ORS;  Service: General;  Laterality: Left;   CHOLECYSTECTOMY     EYE SURGERY Bilateral 2003   cataract extraction   implants  2003   dental, not removable   JOINT REPLACEMENT Bilateral 2003,2012   total hip replacements   JOINT REPLACEMENT Right 2014   right total knee replacement   SKIN CANCER EXCISION     left hand and right lower leg   THYROIDECTOMY, PARTIAL  2003   TONSILLECTOMY      Social History   Socioeconomic History   Marital status: Widowed    Spouse name: Not on file   Number of children: Not on file   Years of education: Not on file   Highest education level: Not on file  Occupational History   Not on file  Tobacco Use   Smoking status: Former    Packs/day: 1.5  Types: Cigarettes    Quit date: 06/29/1985    Years since quitting: 37.3   Smokeless tobacco: Never  Vaping Use   Vaping Use: Never used  Substance and Sexual Activity   Alcohol use: Yes    Alcohol/week: 1.0 - 2.0 standard drink of alcohol    Types: 1 - 2 Standard drinks or equivalent per week    Comment: wine 3 times a week   Drug use: No   Sexual activity: Never  Other Topics Concern   Not on file  Social History Narrative   Not on file   Social Determinants of Health   Financial Resource Strain: Not on file  Food Insecurity: Not on file  Transportation Needs: Not on file  Physical Activity: Not on file  Stress: Not on file  Social Connections: Not on file  Intimate Partner Violence: Not on file    Family History  Problem Relation Age of Onset   Heart failure Mother    Heart attack Father    Diabetes  Brother    Heart disease Brother    Melanoma Brother    Cancer Maternal Uncle    Diabetes Paternal Aunt    Heart disease Paternal Aunt    Heart disease Paternal Grandmother    Diabetes Maternal Aunt    Heart attack Maternal Aunt    Diabetes Maternal Aunt    Diabetes Paternal Aunt    Heart disease Paternal Aunt    Breast cancer Neg Hx     Allergies  Allergen Reactions   Elemental Sulfur Hives and Shortness Of Breath   Iodinated Contrast Media Hives, Other (See Comments) and Shortness Of Breath    Restless. Difficulty breathing. Topical betadine okay  Restless  Restless  Restless. Difficulty breathing. Topical betadine okay    Restless   Codeine Nausea And Vomiting    With high doses, passes out   Morphine Nausea And Vomiting    With high doses, passes out   Oxycodone Nausea And Vomiting and Other (See Comments)    Dizzy and with high doses, passes out.   Penicillins Other (See Comments)    "Hardened lump" Has patient had a PCN reaction causing immediate rash, facial/tongue/throat swelling, SOB or lightheadedness with hypotension: No Has patient had a PCN reaction causing severe rash involving mucus membranes or skin necrosis: No Has patient had a PCN reaction that required hospitalization No Has patient had a PCN reaction occurring within the last 10 years: No If all of the above answers are "NO", then may proceed with Cephalosporin use.     Sulfa Antibiotics Rash    Review of Systems  Gastrointestinal:  Positive for constipation.  All other systems reviewed and are negative.      Objective:   BP 128/64   Pulse 100   Ht 4\' 11"  (1.499 m)   Wt 180 lb 9.6 oz (81.9 kg)   SpO2 93%   BMI 36.48 kg/m   Vitals:   10/22/22 1421  BP: 128/64  Pulse: 100  Height: 4\' 11"  (1.499 m)  Weight: 180 lb 9.6 oz (81.9 kg)  SpO2: 93%  BMI (Calculated): 36.46    Physical Exam Vitals and nursing note reviewed.  Constitutional:      Appearance: Normal appearance. She is  obese.  HENT:     Head: Normocephalic.  Eyes:     Pupils: Pupils are equal, round, and reactive to light.  Cardiovascular:     Rate and Rhythm: Normal rate and regular rhythm.  Pulmonary:  Effort: Pulmonary effort is normal.  Neurological:     Mental Status: She is alert.  Psychiatric:        Mood and Affect: Mood normal.        Behavior: Behavior normal.        Thought Content: Thought content normal.        Judgment: Judgment normal.      Results for orders placed or performed in visit on 10/22/22  POCT CBG (Fasting - Glucose)  Result Value Ref Range   Glucose Fasting, POC 102 (A) 70 - 99 mg/dL  POCT Urinalysis Dipstick (81002)  Result Value Ref Range   Color, UA     Clarity, UA     Glucose, UA Positive (A) Negative   Bilirubin, UA Negative    Ketones, UA Negative    Spec Grav, UA 1.025 1.010 - 1.025   Blood, UA Negative    pH, UA 6.0 5.0 - 8.0   Protein, UA Negative Negative   Urobilinogen, UA 0.2 0.2 or 1.0 E.U./dL   Nitrite, UA Negative    Leukocytes, UA Negative Negative   Appearance     Odor    POC CREATINE & ALBUMIN,URINE  Result Value Ref Range   Microalbumin Ur, POC 30 mg/L   Creatinine, POC 200 mg/dL   Albumin/Creatinine Ratio, Urine, POC 30     Recent Results (from the past 2160 hour(s))  CBC With Differential     Status: Abnormal   Collection Time: 10/21/22  9:55 AM  Result Value Ref Range   WBC 6.6 3.4 - 10.8 x10E3/uL   RBC 5.22 3.77 - 5.28 x10E6/uL   Hemoglobin 15.6 11.1 - 15.9 g/dL   Hematocrit 16.1 (H) 09.6 - 46.6 %   MCV 93 79 - 97 fL   MCH 29.9 26.6 - 33.0 pg   MCHC 32.3 31.5 - 35.7 g/dL   RDW 04.5 40.9 - 81.1 %   Neutrophils 58 Not Estab. %   Lymphs 29 Not Estab. %   Monocytes 9 Not Estab. %   Eos 3 Not Estab. %   Basos 1 Not Estab. %   Neutrophils Absolute 3.9 1.4 - 7.0 x10E3/uL   Lymphocytes Absolute 1.9 0.7 - 3.1 x10E3/uL   Monocytes Absolute 0.6 0.1 - 0.9 x10E3/uL   EOS (ABSOLUTE) 0.2 0.0 - 0.4 x10E3/uL   Basophils  Absolute 0.1 0.0 - 0.2 x10E3/uL   Immature Granulocytes 0 Not Estab. %   Immature Grans (Abs) 0.0 0.0 - 0.1 x10E3/uL  CMP14+EGFR     Status: Abnormal   Collection Time: 10/21/22  9:55 AM  Result Value Ref Range   Glucose 118 (H) 70 - 99 mg/dL   BUN 25 8 - 27 mg/dL   Creatinine, Ser 9.14 0.57 - 1.00 mg/dL   eGFR 69 >78 GN/FAO/1.30   BUN/Creatinine Ratio 30 (H) 12 - 28   Sodium 140 134 - 144 mmol/L   Potassium 4.3 3.5 - 5.2 mmol/L   Chloride 101 96 - 106 mmol/L   CO2 23 20 - 29 mmol/L   Calcium 10.1 8.7 - 10.3 mg/dL   Total Protein 6.7 6.0 - 8.5 g/dL   Albumin 4.2 3.7 - 4.7 g/dL   Globulin, Total 2.5 1.5 - 4.5 g/dL   Albumin/Globulin Ratio 1.7 1.2 - 2.2   Bilirubin Total 0.4 0.0 - 1.2 mg/dL   Alkaline Phosphatase 119 44 - 121 IU/L   AST 19 0 - 40 IU/L   ALT 15 0 - 32 IU/L  Hemoglobin  A1c     Status: Abnormal   Collection Time: 10/21/22  9:55 AM  Result Value Ref Range   Hgb A1c MFr Bld 6.2 (H) 4.8 - 5.6 %    Comment:          Prediabetes: 5.7 - 6.4          Diabetes: >6.4          Glycemic control for adults with diabetes: <7.0    Est. average glucose Bld gHb Est-mCnc 131 mg/dL  Lipid panel     Status: None   Collection Time: 10/21/22  9:55 AM  Result Value Ref Range   Cholesterol, Total 151 100 - 199 mg/dL   Triglycerides 409 0 - 149 mg/dL   HDL 46 >81 mg/dL   VLDL Cholesterol Cal 19 5 - 40 mg/dL   LDL Chol Calc (NIH) 86 0 - 99 mg/dL   Chol/HDL Ratio 3.3 0.0 - 4.4 ratio    Comment:                                   T. Chol/HDL Ratio                                             Men  Women                               1/2 Avg.Risk  3.4    3.3                                   Avg.Risk  5.0    4.4                                2X Avg.Risk  9.6    7.1                                3X Avg.Risk 23.4   11.0   TSH     Status: None   Collection Time: 10/21/22  9:55 AM  Result Value Ref Range   TSH 1.840 0.450 - 4.500 uIU/mL  VITAMIN D 25 Hydroxy (Vit-D Deficiency,  Fractures)     Status: Abnormal   Collection Time: 10/21/22  9:55 AM  Result Value Ref Range   Vit D, 25-Hydroxy 27.9 (L) 30.0 - 100.0 ng/mL    Comment: Vitamin D deficiency has been defined by the Institute of Medicine and an Endocrine Society practice guideline as a level of serum 25-OH vitamin D less than 20 ng/mL (1,2). The Endocrine Society went on to further define vitamin D insufficiency as a level between 21 and 29 ng/mL (2). 1. IOM (Institute of Medicine). 2010. Dietary reference    intakes for calcium and D. [place]: The    [employer]. 2. Holick MF, Binkley [place], Bischoff-Ferrari HA, et al.    Evaluation, treatment, and prevention of vitamin D    deficiency: an Endocrine Society clinical practice    guideline. JCEM. 2011 Jul; 96(7):1911-30.   POCT CBG (Fasting - Glucose)  Status: Abnormal   Collection Time: 10/22/22  2:28 PM  Result Value Ref Range   Glucose Fasting, POC 102 (A) 70 - 99 mg/dL  POC CREATINE & ALBUMIN,URINE     Status: Normal   Collection Time: 10/22/22  2:39 PM  Result Value Ref Range   Microalbumin Ur, POC 30 mg/L   Creatinine, POC 200 mg/dL   Albumin/Creatinine Ratio, Urine, POC 30   POCT Urinalysis Dipstick (21308)     Status: Abnormal   Collection Time: 10/22/22  2:50 PM  Result Value Ref Range   Color, UA     Clarity, UA     Glucose, UA Positive (A) Negative   Bilirubin, UA Negative    Ketones, UA Negative    Spec Grav, UA 1.025 1.010 - 1.025   Blood, UA Negative    pH, UA 6.0 5.0 - 8.0   Protein, UA Negative Negative   Urobilinogen, UA 0.2 0.2 or 1.0 E.U./dL   Nitrite, UA Negative    Leukocytes, UA Negative Negative   Appearance     Odor         Assessment & Plan:   Problem List Items Addressed This Visit       Endocrine   Type 2 diabetes mellitus with hyperglycemia (HCC) - Primary    Continue current diabetes POC, as patient has been well controlled on current regimen.  Will adjust meds if needed based on  labs.        Relevant Orders   POCT CBG (Fasting - Glucose) (Completed)   POC CREATINE & ALBUMIN,URINE (Completed)     Other   HLD (hyperlipidemia)    Continue current therapy for lipid control. Will modify as needed based on labwork results.        Primary cancer of upper outer quadrant of left female breast [facility])    Patient has received treatment and is in remission.  Will continue to monitor going forward.        Other Visit Diagnoses     Screening for blood or protein in urine       Relevant Orders   POCT Urinalysis Dipstick (65784) (Completed)       Return in about 3 months (around 01/21/2023).   Total time spent: 30 minutes  Miki Kins, FNP  10/22/2022

## 2022-10-25 NOTE — Assessment & Plan Note (Signed)
Continue current diabetes POC, as patient has been well controlled on current regimen.  Will adjust meds if needed based on labs.  

## 2022-10-25 NOTE — Assessment & Plan Note (Signed)
Patient has received treatment and is in remission.  Will continue to monitor going forward.

## 2022-10-25 NOTE — Assessment & Plan Note (Signed)
Blood pressure well controlled with current medications.  Continue current therapy.  Will reassess at follow up.  

## 2022-10-25 NOTE — Assessment & Plan Note (Signed)
Continue current therapy for lipid control. Will modify as needed based on labwork results.   

## 2022-10-27 ENCOUNTER — Other Ambulatory Visit: Payer: Self-pay

## 2022-10-27 MED ORDER — LEVOTHYROXINE SODIUM 50 MCG PO TABS: 50.0000 ug | ORAL_TABLET | Freq: Every day | ORAL | 2 refills | Status: DC

## 2022-11-01 ENCOUNTER — Other Ambulatory Visit: Payer: Self-pay | Admitting: Family

## 2022-11-09 ENCOUNTER — Ambulatory Visit: Payer: PPO

## 2022-11-10 NOTE — Chronic Care Management (AMB) (Signed)
Follow Up Pharmacist Visit (CCM)  Kelsey Young D 89 years, Female  DOB: [birth year]  M:   __________________________________________________ Clinical Summary Next Pharmacist Follow Up: FPO in 5months Next AWV: 03/12/2023 Summary for PCP: _ Recommend Omeprazole taper down (every other day for 2 weeks and then 2 days in between) - Recommended she stop ASA ( no evidence of benefit for age>80). She will consult cardiology before deciding. - She needs a new BP monitor and will contact insurance (HTA) regarding OTC fund. - Recommended Prevnar 20 - She is going to think and reach out to [place] if she needs an upgrade on CGM. - HC general/ HTN assessment in 2 weeks (ask about BP monitor and BP logs) and GERD in 2 months . Patient's Chronic Conditions: Hypertension (HTN), Gastroesophageal Reflux Disease (GERD), Diabetes (DM), Hypothyroidism, Hyperlipidemia/Dyslipidemia (HLD), Other List Other Conditions (separated by comma): Sleep Apnea, Lumbar Canal Stenosis, Breast Cancer of left breast, Squamous cell carcinoma, Degenerative disc disease, Nonrheumatic aortic valve, Sleep Apnea, Adrenal Adenoma, Hyperglycemia, Lumbar Radiculitis  . Engagement Notes thompson, joni on 11/05/2022 12:44 PM HC TIME: 0:25:00 . Disease Assessments Visit Date No Task Details Subjective Information Subjective: lives alone surgical hx ; hip and knee replacement, sciatica is worse. sometimes constipated, uses enema and MiraLAX. She is using Sciatic ease - helps with constipation - 2 in the morning and 2 in the afternoon. however, she is getting lot more highs on sugars. She wants an updated CGM (Freestyle 3)  What is the patient's sleep pattern?: Other (with free form text) Details: Sleeps good, may take a mid day nap SDOH: Accountable Health Communities Health-Related Social Needs Screening Tool ([employer]) SDOH questions were documented in [employer] within  the past 12 months or since hospitalization?: Yes Medication Adherence Does the [facility] have access to medication refill history?: No Is Patient using [facility] pharmacy?: No Name and location of [facility] Current Rx insurance plan: HTA . Hypertension (HTN) Most Recent BP: 128/64 Most Recent HR: 100 taken on: 10/22/2022 Care Gap: Need BP documented or last BP 140/90 or higher: Addressed Assessed today?: Yes BP today is: 140/80 Goal: <130/80 mmHG Is Patient checking BP at home?: Yes Has patient experienced hypotension, dizziness, falls or bradycardia?: No We discussed: Hypertension pathophysiology, complications, treatment goals, and management with patient for 10-15 minutes, Contacting PCP office for signs and symptoms of high or low blood pressure (hypotension, dizziness, falls, headaches, edema) Assessment:: Uncontrolled Drug: Metoprolol Tartrate 25mg - 1 tablet daily.  Pharmacist Assessment: Appropriate, Query Effectiveness Drug: Losartan 50mg - 1 tablet at bedtime.  Pharmacist Assessment: Appropriate, Query Effectiveness Drug: Maxzide-25 37.5-25mg - 0.5 tablet daily.  Pharmacist Assessment: Appropriate, Query Effectiveness Drug: Cardizem 300mg - 1 tablet daily Pharmacist Assessment: Appropriate, Query Effectiveness Plan/Follow up: Order: BP logs, patient needs a new BP monitor Review: BP logs . Hyperlipidemia/Dyslipidemia (HLD) Last Lipid panel on: 10/21/2022 TC (Goal<200): 151 LDL: 86 HDL (Goal>40): 46 TG (Goal<150): 103 ASCVD 10-year risk?is:: N/A due to Age > 25 Assessed today?: No Drug: Atorvastatin 20mg - 1 tablet daily . Diabetes (DM) Most recent A1C: 6.2 taken on: 10/21/2022 Most Recent GFR: 69 taken on: 10/21/2022 Type: 2 Most recent microalbumin ratio: N/A Care Gap: Statin therapy needed: Addressed Care Gap: Need A1c documented or last A1c > 9 %: Addressed Care Gap: Need eye exam documented in EMR or by claim: Addressed Care Gap: Need eGFR and uACR for  kidney health evaluation: Addressed Assessed today?: No GERD Assessment Assessed today?: Yes Frequency of GERD sx: never Does patient experience difficulty or pain with  swallowing?: No Additional Info: started a sciatic med, and suspects that causing her heart burn symptoms Assessment:: Uncontrolled Drug: Omeprazole 20mg - 1 tablet daily.  Pharmacist Assessment: Query Appropriateness Drug: Dulcolax 5mg - 2 tablets daily.  Pharmacist Assessment: Query Appropriateness Plan/Follow up: Order: Omeprazole taper down . Hypothyroidism Most recent TSH: 1.840 taken on: 10/21/2022 Most recent T4: N/A Most recent T3: N/A Assessed today?: No Drug: Levothyroxine - 1 tablet daily.  General Disease Assessment Assessed today?: No Preventative Health Care Gap: Colorectal cancer screening: Patient excluded from population (Age > 45, hx of colorectal cancer or colectomy, hospice services) Care Gap: Breast cancer screening: Patient excluded from population (Age > 76, hx of bilateral mastectomy, frailty, hospice services) Care Gap: Annual Wellness Visit (AWV): Needs to be addressed  . Lynann Bologna, PharmD Chart review Office visit Documentation 

## 2022-11-12 ENCOUNTER — Ambulatory Visit (INDEPENDENT_AMBULATORY_CARE_PROVIDER_SITE_OTHER): Payer: PPO | Admitting: Family

## 2022-11-12 ENCOUNTER — Encounter: Payer: Self-pay | Admitting: Family

## 2022-11-12 DIAGNOSIS — E1165 Type 2 diabetes mellitus with hyperglycemia: Secondary | ICD-10-CM

## 2022-11-12 DIAGNOSIS — C50412 Malignant neoplasm of upper-outer quadrant of left female breast: Secondary | ICD-10-CM

## 2022-11-12 DIAGNOSIS — Z7189 Other specified counseling: Secondary | ICD-10-CM | POA: Diagnosis not present

## 2022-11-12 NOTE — ACP (Advance Care Planning) (Signed)
adv

## 2022-11-14 ENCOUNTER — Encounter: Payer: Self-pay | Admitting: Family

## 2022-11-14 NOTE — Progress Notes (Signed)
Established Patient Office Visit  Subjective:  Patient ID: Kelsey Young, female    DOB: Jan 11, 1932  Age: 89 y.o. MRN: [id number]  Chief Complaint  Patient presents with   Follow-up    Follow up    Patient is here today because she would like to discuss filling out a MOST form. She has a copy that came from the home visit nurse who suggested that she bring it in, have [place] complete it, and keep it at home.   She is completely capable today of making decisions regarding her care and I have no concerns for cognitive impairment.  We reviewed the form together as we completed it, and discussed each point.   She does with for CPR to be attempted, and she does want antibiotics, IV fluids.  She does not wish to have a feeding tube or a breathing tube if it will be a permanent fixture, though she says that she is okay with short usage if needed.      Past Medical History:  Diagnosis Date   Breast cancer (HCC)    Cancer (HCC) 03/2017   left breast   Chest pain, rule out acute myocardial infarction 11/23/2016   Complication of anesthesia    has been slow to wake up   COPD (chronic obstructive pulmonary disease) (HCC)    Diabetes mellitus without complication (HCC)    Dyspnea    Edema leg 03/2017   bilateral swelling   Family history of adverse reaction to anesthesia    son had surgery and heart stopped during procedure   GERD (gastroesophageal reflux disease)    Heart murmur    no treatment   Hemorrhoids 03/2017   History of kidney stones    had eswl   Hyperlipidemia    Hypertension    Hyponatremia 11/25/2016   Hypothyroidism    Lumbar stenosis    Nausea 08/23/2019   Palpitations 11/25/2016   Personal history of radiation therapy 2018   LEFT lumpectomy   Skin cancer 2016,2017,2018   face, back of neck, shoulder, leg, hand   Sleep apnea    does not and will not use a cpap   Thyroid disease     Past Surgical History:  Procedure Laterality Date   ABDOMINAL  HYSTERECTOMY  1982   APPENDECTOMY  1940   BREAST BIOPSY Left 04/09/2017   [facility]   BREAST LUMPECTOMY Left 04/30/2017   [facility], clear margins, negative LN   BREAST LUMPECTOMY WITH NEEDLE LOCALIZATION AND AXILLARY SENTINEL LYMPH NODE BX Left 04/30/2017   Procedure: LEFT BREAST LUMPECTOMY WITH NEEDLE LOCALIZATION AND LEFT AXILLARY SENTINEL LYMPH NODE BX;  Surgeon: Luretha Murphy, MD;  Location: [facility] ORS;  Service: General;  Laterality: Left;   CHOLECYSTECTOMY     EYE SURGERY Bilateral 2003   cataract extraction   implants  2003   dental, not removable   JOINT REPLACEMENT Bilateral 2003,2012   total hip replacements   JOINT REPLACEMENT Right 2014   right total knee replacement   SKIN CANCER EXCISION     left hand and right lower leg   THYROIDECTOMY, PARTIAL  2003   TONSILLECTOMY      Social History   Socioeconomic History   Marital status: Widowed    Spouse name: Not on file   Number of children: Not on file   Years of education: Not on file   Highest education level: Not on file  Occupational History   Not on file  Tobacco Use   Smoking  status: Former    Packs/day: 1.5    Types: Cigarettes    Quit date: 06/29/1985    Years since quitting: 37.4   Smokeless tobacco: Never  Vaping Use   Vaping Use: Never used  Substance and Sexual Activity   Alcohol use: Yes    Alcohol/week: 1.0 - 2.0 standard drink of alcohol    Types: 1 - 2 Standard drinks or equivalent per week    Comment: wine 3 times a week   Drug use: No   Sexual activity: Never  Other Topics Concern   Not on file  Social History Narrative   Not on file   Social Determinants of Health   Financial Resource Strain: Not on file  Food Insecurity: Not on file  Transportation Needs: Not on file  Physical Activity: Not on file  Stress: Not on file  Social Connections: Not on file  Intimate Partner Violence: Not on file    Family History  Problem Relation Age of Onset   Heart failure Mother    Heart attack Father     Diabetes Brother    Heart disease Brother    Melanoma Brother    Cancer Maternal Uncle    Diabetes Paternal Aunt    Heart disease Paternal Aunt    Heart disease Paternal Grandmother    Diabetes Maternal Aunt    Heart attack Maternal Aunt    Diabetes Maternal Aunt    Diabetes Paternal Aunt    Heart disease Paternal Aunt    Breast cancer Neg Hx     Allergies  Allergen Reactions   Elemental Sulfur Hives and Shortness Of Breath   Iodinated Contrast Media Hives, Other (See Comments) and Shortness Of Breath    Restless. Difficulty breathing. Topical betadine okay  Restless  Restless  Restless. Difficulty breathing. Topical betadine okay    Restless   Codeine Nausea And Vomiting    With high doses, passes out   Morphine Nausea And Vomiting    With high doses, passes out   Oxycodone Nausea And Vomiting and Other (See Comments)    Dizzy and with high doses, passes out.   Penicillins Other (See Comments)    "Hardened lump" Has patient had a PCN reaction causing immediate rash, facial/tongue/throat swelling, SOB or lightheadedness with hypotension: No Has patient had a PCN reaction causing severe rash involving mucus membranes or skin necrosis: No Has patient had a PCN reaction that required hospitalization No Has patient had a PCN reaction occurring within the last 10 years: No If all of the above answers are "NO", then may proceed with Cephalosporin use.     Sulfa Antibiotics Rash    ROS     Objective:   There were no vitals taken for this visit.  There were no vitals filed for this visit.  Physical Exam   No results found for any visits on 11/12/22.  Recent Results (from the past 2160 hour(s))  CBC With Differential     Status: Abnormal   Collection Time: 10/21/22  9:55 AM  Result Value Ref Range   WBC 6.6 3.4 - 10.8 x10E3/uL   RBC 5.22 3.77 - 5.28 x10E6/uL   Hemoglobin 15.6 11.1 - 15.9 g/dL   Hematocrit 16.1 (H) 09.6 - 46.6 %   MCV 93 79 - 97 fL   MCH  29.9 26.6 - 33.0 pg   MCHC 32.3 31.5 - 35.7 g/dL   RDW 04.5 40.9 - 81.1 %   Neutrophils 58 Not Estab. %  Lymphs 29 Not Estab. %   Monocytes 9 Not Estab. %   Eos 3 Not Estab. %   Basos 1 Not Estab. %   Neutrophils Absolute 3.9 1.4 - 7.0 x10E3/uL   Lymphocytes Absolute 1.9 0.7 - 3.1 x10E3/uL   Monocytes Absolute 0.6 0.1 - 0.9 x10E3/uL   EOS (ABSOLUTE) 0.2 0.0 - 0.4 x10E3/uL   Basophils Absolute 0.1 0.0 - 0.2 x10E3/uL   Immature Granulocytes 0 Not Estab. %   Immature Grans (Abs) 0.0 0.0 - 0.1 x10E3/uL  CMP14+EGFR     Status: Abnormal   Collection Time: 10/21/22  9:55 AM  Result Value Ref Range   Glucose 118 (H) 70 - 99 mg/dL   BUN 25 8 - 27 mg/dL   Creatinine, Ser 6.57 0.57 - 1.00 mg/dL   eGFR 69 >84 ON/GEX/5.28   BUN/Creatinine Ratio 30 (H) 12 - 28   Sodium 140 134 - 144 mmol/L   Potassium 4.3 3.5 - 5.2 mmol/L   Chloride 101 96 - 106 mmol/L   CO2 23 20 - 29 mmol/L   Calcium 10.1 8.7 - 10.3 mg/dL   Total Protein 6.7 6.0 - 8.5 g/dL   Albumin 4.2 3.7 - 4.7 g/dL   Globulin, Total 2.5 1.5 - 4.5 g/dL   Albumin/Globulin Ratio 1.7 1.2 - 2.2   Bilirubin Total 0.4 0.0 - 1.2 mg/dL   Alkaline Phosphatase 119 44 - 121 IU/L   AST 19 0 - 40 IU/L   ALT 15 0 - 32 IU/L  Hemoglobin A1c     Status: Abnormal   Collection Time: 10/21/22  9:55 AM  Result Value Ref Range   Hgb A1c MFr Bld 6.2 (H) 4.8 - 5.6 %    Comment:          Prediabetes: 5.7 - 6.4          Diabetes: >6.4          Glycemic control for adults with diabetes: <7.0    Est. average glucose Bld gHb Est-mCnc 131 mg/dL  Lipid panel     Status: None   Collection Time: 10/21/22  9:55 AM  Result Value Ref Range   Cholesterol, Total 151 100 - 199 mg/dL   Triglycerides 413 0 - 149 mg/dL   HDL 46 >24 mg/dL   VLDL Cholesterol Cal 19 5 - 40 mg/dL   LDL Chol Calc (NIH) 86 0 - 99 mg/dL   Chol/HDL Ratio 3.3 0.0 - 4.4 ratio    Comment:                                   T. Chol/HDL Ratio                                             Men   Women                               1/2 Avg.Risk  3.4    3.3                                   Avg.Risk  5.0    4.4  2X Avg.Risk  9.6    7.1                                3X Avg.Risk 23.4   11.0   TSH     Status: None   Collection Time: 10/21/22  9:55 AM  Result Value Ref Range   TSH 1.840 0.450 - 4.500 uIU/mL  VITAMIN D 25 Hydroxy (Vit-D Deficiency, Fractures)     Status: Abnormal   Collection Time: 10/21/22  9:55 AM  Result Value Ref Range   Vit D, 25-Hydroxy 27.9 (L) 30.0 - 100.0 ng/mL    Comment: Vitamin D deficiency has been defined by the Institute of Medicine and an Endocrine Society practice guideline as a level of serum 25-OH vitamin D less than 20 ng/mL (1,2). The Endocrine Society went on to further define vitamin D insufficiency as a level between 21 and 29 ng/mL (2). 1. IOM (Institute of Medicine). 2010. Dietary reference    intakes for calcium and D. [place]: The    [employer]. 2. Holick MF, Binkley Alafaya, Bischoff-Ferrari HA, et al.    Evaluation, treatment, and prevention of vitamin D    deficiency: an Endocrine Society clinical practice    guideline. JCEM. 2011 Jul; 96(7):1911-30.   POCT CBG (Fasting - Glucose)     Status: Abnormal   Collection Time: 10/22/22  2:28 PM  Result Value Ref Range   Glucose Fasting, POC 102 (A) 70 - 99 mg/dL  POC CREATINE & ALBUMIN,URINE     Status: Normal   Collection Time: 10/22/22  2:39 PM  Result Value Ref Range   Microalbumin Ur, POC 30 mg/L   Creatinine, POC 200 mg/dL   Albumin/Creatinine Ratio, Urine, POC 30   POCT Urinalysis Dipstick (16109)     Status: Abnormal   Collection Time: 10/22/22  2:50 PM  Result Value Ref Range   Color, UA     Clarity, UA     Glucose, UA Positive (A) Negative   Bilirubin, UA Negative    Ketones, UA Negative    Spec Grav, UA 1.025 1.010 - 1.025   Blood, UA Negative    pH, UA 6.0 5.0 - 8.0   Protein, UA Negative Negative   Urobilinogen, UA 0.2  0.2 or 1.0 E.U./dL   Nitrite, UA Negative    Leukocytes, UA Negative Negative   Appearance     Odor         Assessment & Plan:   Problem List Items Addressed This Visit       Active Problems   Type 2 diabetes mellitus with hyperglycemia (HCC)   Primary cancer of upper outer quadrant of left female breast [facility])    Patient has received treatment and is in remission.  Will continue to monitor going forward.            Other Visit Diagnoses     Advanced care planning/counseling discussion    -  Primary       No follow-ups on file.   Total time spent: 20 minutes  Miki Kins, FNP  11/12/2022   This document may have been prepared by [facility] Voice Recognition software and as such may include unintentional dictation errors.

## 2022-11-14 NOTE — Assessment & Plan Note (Signed)
Patient has received treatment and is in remission.  Will continue to monitor going forward.   

## 2022-11-17 ENCOUNTER — Other Ambulatory Visit: Payer: Self-pay | Admitting: Internal Medicine

## 2022-11-17 ENCOUNTER — Inpatient Hospital Stay: Payer: PPO | Attending: Oncology | Admitting: Oncology

## 2022-11-17 ENCOUNTER — Encounter: Payer: Self-pay | Admitting: Oncology

## 2022-11-17 VITALS — BP 157/69 | HR 83 | Temp 98.3°F | Resp 18 | Ht 59.0 in | Wt 181.0 lb

## 2022-11-17 DIAGNOSIS — Z170 Estrogen receptor positive status [ER+]: Secondary | ICD-10-CM | POA: Diagnosis not present

## 2022-11-17 DIAGNOSIS — Z923 Personal history of irradiation: Secondary | ICD-10-CM | POA: Diagnosis not present

## 2022-11-17 DIAGNOSIS — M2550 Pain in unspecified joint: Secondary | ICD-10-CM | POA: Diagnosis not present

## 2022-11-17 DIAGNOSIS — Z79811 Long term (current) use of aromatase inhibitors: Secondary | ICD-10-CM | POA: Diagnosis not present

## 2022-11-17 DIAGNOSIS — Z809 Family history of malignant neoplasm, unspecified: Secondary | ICD-10-CM | POA: Diagnosis not present

## 2022-11-17 DIAGNOSIS — Z87891 Personal history of nicotine dependence: Secondary | ICD-10-CM | POA: Insufficient documentation

## 2022-11-17 DIAGNOSIS — C50412 Malignant neoplasm of upper-outer quadrant of left female breast: Secondary | ICD-10-CM | POA: Diagnosis present

## 2022-11-17 DIAGNOSIS — Z79899 Other long term (current) drug therapy: Secondary | ICD-10-CM | POA: Diagnosis not present

## 2022-11-17 DIAGNOSIS — G8929 Other chronic pain: Secondary | ICD-10-CM | POA: Insufficient documentation

## 2022-11-17 DIAGNOSIS — R0602 Shortness of breath: Secondary | ICD-10-CM

## 2022-11-17 DIAGNOSIS — M549 Dorsalgia, unspecified: Secondary | ICD-10-CM | POA: Diagnosis not present

## 2022-11-17 DIAGNOSIS — Z807 Family history of other malignant neoplasms of lymphoid, hematopoietic and related tissues: Secondary | ICD-10-CM | POA: Diagnosis not present

## 2022-11-17 DIAGNOSIS — M8580 Other specified disorders of bone density and structure, unspecified site: Secondary | ICD-10-CM | POA: Diagnosis not present

## 2022-11-17 DIAGNOSIS — I2089 Other forms of angina pectoris: Secondary | ICD-10-CM

## 2022-11-17 NOTE — Progress Notes (Signed)
[facility]  [phone] [phone]  ID: Kelsey Young OB: 08-08-1931  MR#: [id number]  AOZ#:[id number]  Patient Care Team: Miki Kins, FNP as PCP - General (Family Medicine) Jeralyn Ruths, MD as Consulting Physician (Oncology)  CHIEF COMPLAINT: Pathologic stage Ia ER positive, PR/HER-2 negative invasive carcinoma of the upper outer quadrant of the left breast.  INTERVAL HISTORY: Patient returns to clinic today for routine 36-month evaluation.  She continues to have chronic back and hip pain, but otherwise feels well.  She continues to tolerate anastrozole without significant side effects.  She has no neurologic complaints. She denies any recent fevers or illnesses. She has a good appetite and denies weight loss.  She denies any chest pain, shortness of breath, cough, or hemoptysis.  She has no nausea, vomiting, constipation, or diarrhea.  She has no urinary complaints.  Patient offers no further specific complaints today.  REVIEW OF SYSTEMS:   Review of Systems  Constitutional: Negative.  Negative for fever, malaise/fatigue and weight loss.  HENT: Negative.  Negative for sore throat.   Respiratory:  Negative for cough and shortness of breath.   Cardiovascular: Negative.  Negative for chest pain and leg swelling.  Gastrointestinal: Negative.  Negative for abdominal pain, blood in stool, constipation, diarrhea, heartburn, melena, nausea and vomiting.  Genitourinary: Negative.  Negative for dysuria.  Musculoskeletal:  Positive for back pain and joint pain.  Skin: Negative.  Negative for rash.  Neurological: Negative.  Negative for dizziness, sensory change, focal weakness, weakness and headaches.  Psychiatric/Behavioral: Negative.  The patient is not nervous/anxious.     As per HPI. Otherwise, a complete review of systems is negative.  PAST MEDICAL HISTORY: Past Medical History:  Diagnosis Date   Breast cancer (HCC)    Cancer (HCC)  03/2017   left breast   Chest pain, rule out acute myocardial infarction 11/23/2016   Complication of anesthesia    has been slow to wake up   COPD (chronic obstructive pulmonary disease) (HCC)    Diabetes mellitus without complication (HCC)    Dyspnea    Edema leg 03/2017   bilateral swelling   Family history of adverse reaction to anesthesia    son had surgery and heart stopped during procedure   GERD (gastroesophageal reflux disease)    Heart murmur    no treatment   Hemorrhoids 03/2017   History of kidney stones    had eswl   Hyperlipidemia    Hypertension    Hyponatremia 11/25/2016   Hypothyroidism    Lumbar stenosis    Nausea 08/23/2019   Palpitations 11/25/2016   Personal history of radiation therapy 2018   LEFT lumpectomy   Skin cancer 2016,2017,2018   face, back of neck, shoulder, leg, hand   Sleep apnea    does not and will not use a cpap   Thyroid disease     PAST SURGICAL HISTORY: Past Surgical History:  Procedure Laterality Date   ABDOMINAL HYSTERECTOMY  1982   APPENDECTOMY  1940   BREAST BIOPSY Left 04/09/2017   [facility]   BREAST LUMPECTOMY Left 04/30/2017   [facility], clear margins, negative LN   BREAST LUMPECTOMY WITH NEEDLE LOCALIZATION AND AXILLARY SENTINEL LYMPH NODE BX Left 04/30/2017   Procedure: LEFT BREAST LUMPECTOMY WITH NEEDLE LOCALIZATION AND LEFT AXILLARY SENTINEL LYMPH NODE BX;  Surgeon: Luretha Murphy, MD;  Location: [facility] ORS;  Service: General;  Laterality: Left;   CHOLECYSTECTOMY     EYE SURGERY Bilateral 2003   cataract  extraction   implants  2003   dental, not removable   JOINT REPLACEMENT Bilateral 2003,2012   total hip replacements   JOINT REPLACEMENT Right 2014   right total knee replacement   SKIN CANCER EXCISION     left hand and right lower leg   THYROIDECTOMY, PARTIAL  2003   TONSILLECTOMY      FAMILY HISTORY: Family History  Problem Relation Age of Onset   Heart failure Mother    Heart attack Father    Diabetes Brother     Heart disease Brother    Melanoma Brother    Cancer Maternal Uncle    Diabetes Paternal Aunt    Heart disease Paternal Aunt    Heart disease Paternal Grandmother    Diabetes Maternal Aunt    Heart attack Maternal Aunt    Diabetes Maternal Aunt    Diabetes Paternal Aunt    Heart disease Paternal Aunt    Breast cancer Neg Hx     ADVANCED DIRECTIVES (Y/N):  N  HEALTH MAINTENANCE: Social History   Tobacco Use   Smoking status: Former    Packs/day: 1.5    Types: Cigarettes    Quit date: 06/29/1985    Years since quitting: 37.4   Smokeless tobacco: Never  Vaping Use   Vaping Use: Never used  Substance Use Topics   Alcohol use: Yes    Alcohol/week: 1.0 - 2.0 standard drink of alcohol    Types: 1 - 2 Standard drinks or equivalent per week    Comment: wine 3 times a week   Drug use: No     Colonoscopy:  PAP:  Bone density:  Lipid panel:  Allergies  Allergen Reactions   Elemental Sulfur Hives and Shortness Of Breath   Iodinated Contrast Media Hives, Other (See Comments) and Shortness Of Breath    Restless. Difficulty breathing. Topical betadine okay  Restless  Restless  Restless. Difficulty breathing. Topical betadine okay    Restless   Codeine Nausea And Vomiting    With high doses, passes out   Morphine Nausea And Vomiting    With high doses, passes out   Oxycodone Nausea And Vomiting and Other (See Comments)    Dizzy and with high doses, passes out.   Penicillins Other (See Comments)    "Hardened lump" Has patient had a PCN reaction causing immediate rash, facial/tongue/throat swelling, SOB or lightheadedness with hypotension: No Has patient had a PCN reaction causing severe rash involving mucus membranes or skin necrosis: No Has patient had a PCN reaction that required hospitalization No Has patient had a PCN reaction occurring within the last 10 years: No If all of the above answers are "NO", then may proceed with Cephalosporin use.     Sulfa Antibiotics  Rash    Current Outpatient Medications  Medication Sig Dispense Refill   acetaminophen (TYLENOL) 500 MG tablet Take 500 mg by mouth every 8 (eight) hours as needed (takes only as needed).     anastrozole (ARIMIDEX) 1 MG tablet Take 1 tablet (1 mg total) by mouth daily. 90 tablet 3   aspirin EC 81 MG EC tablet Take 1 tablet (81 mg total) by mouth daily. 30 tablet 3   atorvastatin (LIPITOR) 20 MG tablet Take by mouth.     Coenzyme Q10 (CO Q 10 PO) Take 100 mg by mouth daily.      Continuous Glucose Sensor (FREESTYLE LIBRE 2 SENSOR) MISC PLEASE SEE ATTACHED FOR DETAILED DIRECTIONS     diltiazem (CARDIZEM CD)  300 MG 24 hr capsule Take 1 capsule (300 mg total) by mouth daily. 7 capsule 0   docusate sodium (COLACE) 100 MG capsule Take 200 mg by mouth 2 (two) times daily.     econazole nitrate 1 % cream Use 1 % CREAM     FARXIGA 10 MG TABS tablet Take 1 tablet by mouth daily.     levothyroxine (SYNTHROID) 50 MCG tablet Take 1 tablet (50 mcg total) by mouth daily. 90 tablet 2   losartan (COZAAR) 50 MG tablet TAKE 1 TABLET BY MOUTH EVERY DAY 90 tablet 3   metoprolol succinate (TOPROL-XL) 25 MG 24 hr tablet Take 1 tablet by mouth daily.     omeprazole (PRILOSEC) 20 MG capsule Take 20 mg by mouth daily as needed (for heartburn).      triamcinolone cream (KENALOG) 0.1 % Apply 1 Application topically 2 (two) times daily.     triamterene-hydrochlorothiazide (MAXZIDE-25) 37.5-25 MG tablet Take 0.5 tablets by mouth daily. 90 tablet 3   No current facility-administered medications for this visit.    OBJECTIVE: Vitals:   11/17/22 1436  BP: (!) 157/69  Pulse: 83  Resp: 18  Temp: 98.3 F (36.8 C)  SpO2: 95%     Body mass index is 36.56 kg/m.    ECOG FS:1 - Symptomatic but completely ambulatory  General: Well-developed, well-nourished, no acute distress. Eyes: Pink conjunctiva, anicteric sclera. HEENT: Normocephalic, moist mucous membranes. Lungs: No audible wheezing or coughing. Heart: Regular  rate and rhythm. Abdomen: Soft, nontender, no obvious distention. Musculoskeletal: No edema, cyanosis, or clubbing. Neuro: Alert, answering all questions appropriately. Cranial nerves grossly intact. Skin: No rashes or petechiae noted. Psych: Normal affect.   LAB RESULTS:  Lab Results  Component Value Date   NA 140 10/21/2022   K 4.3 10/21/2022   CL 101 10/21/2022   CO2 23 10/21/2022   GLUCOSE 118 (H) 10/21/2022   BUN 25 10/21/2022   CREATININE 0.82 10/21/2022   CALCIUM 10.1 10/21/2022   PROT 6.7 10/21/2022   ALBUMIN 4.2 10/21/2022   AST 19 10/21/2022   ALT 15 10/21/2022   ALKPHOS 119 10/21/2022   BILITOT 0.4 10/21/2022   GFRNONAA >60 04/22/2017   GFRAA >60 04/22/2017    Lab Results  Component Value Date   WBC 6.6 10/21/2022   NEUTROABS 3.9 10/21/2022   HGB 15.6 10/21/2022   HCT 48.3 (H) 10/21/2022   MCV 93 10/21/2022   PLT 310 04/22/2017     STUDIES: No results found.  ASSESSMENT: Pathologic stage Ia ER positive, PR/HER-2 negative invasive carcinoma of the upper outer quadrant of the left breast.  PLAN:    Pathologic stage Ia ER positive, PR/HER-2 negative invasive carcinoma of the upper outer quadrant of the left breast: Given the small size of patient's tumor and her advanced age, it was decided upon that chemotherapy was not necessary and Oncotype DX was not ordered.  Patient completed adjuvant XRT.  Patient discontinued letrozole secondary to side effects, but now is tolerating anastrozole well.  Continue treatment for a total of 5 years completing in July 2024.  Patient has been instructed to complete her current prescription and then discontinue.  Her most recent mammogram on May 12, 2022 was reported as BI-RADS 1.  Repeat in November 2024.  Follow-up 2 to 3 days after her next mammogram.   Osteopenia: Patient's most recent bone mineral density on December 02, 2021 reported T-score of -2.3.  This continues to trend down and is worse than 1 year  prior with a  T-score was reported -1.8.  Continue calcium and vitamin D supplementation.  Repeat next bone mineral density in November 2024 along with a mammogram as above.  Back/joint pain: Chronic and unchanged. Hypertension: Patient's blood pressure is moderately elevated today.  Continue monitoring and treatment per primary care.   Patient expressed understanding and was in agreement with this plan. She also understands that She can call clinic at any time with any questions, concerns, or complaints.    Cancer Staging  Primary cancer of upper outer quadrant of left female breast [facility]) Staging form: Breast, AJCC 8th Edition - Clinical stage from 04/17/2017: Stage IA (cT1a, cN0, cM0, G2, ER+, PR-, HER2-) - Signed by Jeralyn Ruths, MD on 04/17/2017 Histologic grading system: 3 grade system - Pathologic stage from 05/11/2017: Stage IA (pT1b, pN0, cM0, G2, ER+, PR-, HER2-) - Signed by Jeralyn Ruths, MD on 05/11/2017 Neoadjuvant therapy: No Histologic grading system: 3 grade system Laterality: Left   Jeralyn Ruths, MD   11/17/2022 6:38 PM

## 2022-12-02 ENCOUNTER — Other Ambulatory Visit: Payer: Self-pay | Admitting: Family

## 2022-12-02 MED ORDER — DILTIAZEM HCL ER COATED BEADS 300 MG PO CP24: 300.0000 mg | ORAL_CAPSULE | Freq: Every day | ORAL | 0 refills | Status: DC

## 2022-12-03 ENCOUNTER — Other Ambulatory Visit: Payer: Self-pay

## 2022-12-03 MED ORDER — DILTIAZEM HCL ER COATED BEADS 300 MG PO CP24: 300.0000 mg | ORAL_CAPSULE | Freq: Every day | ORAL | 1 refills | Status: DC

## 2022-12-18 ENCOUNTER — Telehealth (HOSPITAL_COMMUNITY): Payer: Self-pay | Admitting: *Deleted

## 2022-12-18 NOTE — Telephone Encounter (Signed)
Attempted to call patient regarding upcoming cardiac CT appointment. °Left message on voicemail with name and callback number ° °Thresa Dozier RN Navigator Cardiac Imaging °[facility] °[phone] Office °[phone] Cell ° °

## 2022-12-21 ENCOUNTER — Other Ambulatory Visit: Payer: Self-pay | Admitting: Internal Medicine

## 2022-12-21 ENCOUNTER — Ambulatory Visit
Admission: RE | Admit: 2022-12-21 | Discharge: 2022-12-21 | Disposition: A | Discharge status: Home / Self Care | Payer: PPO | Source: Ambulatory Visit | Attending: Internal Medicine | Admitting: Internal Medicine

## 2022-12-21 DIAGNOSIS — R0602 Shortness of breath: Secondary | ICD-10-CM | POA: Diagnosis present

## 2022-12-21 DIAGNOSIS — I2089 Other forms of angina pectoris: Secondary | ICD-10-CM

## 2022-12-21 DIAGNOSIS — I700 Atherosclerosis of aorta: Secondary | ICD-10-CM | POA: Diagnosis not present

## 2022-12-21 DIAGNOSIS — I2510 Atherosclerotic heart disease of native coronary artery without angina pectoris: Secondary | ICD-10-CM | POA: Diagnosis not present

## 2022-12-21 LAB — POCT I-STAT CREATININE: Creatinine, Ser: 0.9 mg/dL (ref 0.44–1.00)

## 2022-12-21 MED ORDER — METOPROLOL TARTRATE 5 MG/5ML IV SOLN
INTRAVENOUS | Status: AC
  Filled 2022-12-21: qty 10

## 2022-12-21 MED ORDER — METOPROLOL TARTRATE 5 MG/5ML IV SOLN
10.0000 mg | Freq: Once | INTRAVENOUS | Status: AC
  Administered 2022-12-21: 10 mg via INTRAVENOUS
  Filled 2022-12-21: qty 10

## 2022-12-21 MED ORDER — DILTIAZEM HCL 25 MG/5ML IV SOLN
10.0000 mg | Freq: Once | INTRAVENOUS | Status: AC
  Administered 2022-12-21: 5 mg via INTRAVENOUS
  Filled 2022-12-21: qty 5

## 2022-12-21 MED ORDER — DILTIAZEM HCL 25 MG/5ML IV SOLN
INTRAVENOUS | Status: AC
  Filled 2022-12-21: qty 5

## 2022-12-21 MED ORDER — NITROGLYCERIN 0.4 MG SL SUBL
0.8000 mg | SUBLINGUAL_TABLET | Freq: Once | SUBLINGUAL | Status: AC
  Administered 2022-12-21: 0.8 mg via SUBLINGUAL
  Filled 2022-12-21: qty 25

## 2022-12-21 MED ORDER — IOHEXOL 350 MG/ML SOLN
80.0000 mL | Freq: Once | INTRAVENOUS | Status: AC | PRN
Start: 2022-12-21 — End: 2022-12-21
  Administered 2022-12-21: 80 mL via INTRAVENOUS

## 2022-12-21 NOTE — Progress Notes (Signed)
Patient tolerated procedure well. Ambulate w/o difficulty. Denies any lightheadedness or being dizzy. Pt denies any pain at this time. Sitting in chair, pt is encouraged to drink additional water throughout the day and reason explained to patient. Patient verbalized understanding and all questions answered. ABC intact. No further needs at this time. Discharge from procedure area w/o issues.  

## 2023-01-08 ENCOUNTER — Other Ambulatory Visit: Payer: Self-pay

## 2023-01-08 ENCOUNTER — Inpatient Hospital Stay
Admission: RE | Admit: 2023-01-08 | Discharge: 2023-01-08 | Disposition: A | Discharge status: Home / Self Care | Payer: Self-pay | Source: Ambulatory Visit | Attending: Neurosurgery | Admitting: Neurosurgery

## 2023-01-08 DIAGNOSIS — Z049 Encounter for examination and observation for unspecified reason: Secondary | ICD-10-CM

## 2023-01-18 NOTE — Progress Notes (Deleted)
Referring Physician:  Alwyn Pea, MD [address] [place],  [place] [address]  Primary Physician:  Miki Kins, FNP  History of Present Illness: 01/18/2023 Ms. Kelsey Young is here today with a chief complaint of ***   Low back pain? Any leg pain?  Duration: ***10+ years Location: *** Quality: ***sharp Severity: ***10/10  Precipitating: aggravated by ***standing, walking? Modifying factors: made better by ***sitting, heat? Weakness: none Timing: ***constant Bowel/Bladder Dysfunction: none  Conservative measures: she has seen a Chiropractor   Physical therapy: *** none?  Multimodal medical therapy including regular antiinflammatories: *** tylenol  Injections: *** has received epidural steroid injections 07/29/2021: Bilateral S1 transforaminal ESI (no relief)  08/24/2018: Bilateral L4-5 and L5-S1 facet joint injections (postinjection flare) 06/17/2018: Bilateral S1 transforaminal ESI (no relief) 05/17/2018: Bilateral S1 transforaminal ESI (relief x3 days) 01/11/2015: Left L5-S1 and right S1 transforaminal epidural (relief x2 weeks) 11/29/2014: Left L5-S1 and right S1 transforaminal epidural (relief x2 weeks)  07/09/2010: L4-5 interlaminar epidural steroid injection (Dr. Pernell Dupre) 05/29/2010: L5-S1 interlaminar epidural steroid injection (Dr. Pernell Dupre)     Past Surgery: ***denies  MAYLINE DRAGON has ***no symptoms of cervical myelopathy.  The symptoms are causing a significant impact on the patient's life.   Review of Systems:  A 10 point review of systems is negative, except for the pertinent positives and negatives detailed in the HPI.  Past Medical History: Past Medical History:  Diagnosis Date   Breast cancer (HCC)    Cancer (HCC) 03/2017   left breast   Chest pain, rule out acute myocardial infarction 11/23/2016   Complication of anesthesia    has been slow to wake up   COPD (chronic obstructive pulmonary disease) (HCC)    Diabetes mellitus  without complication (HCC)    Dyspnea    Edema leg 03/2017   bilateral swelling   Family history of adverse reaction to anesthesia    son had surgery and heart stopped during procedure   GERD (gastroesophageal reflux disease)    Heart murmur    no treatment   Hemorrhoids 03/2017   History of kidney stones    had eswl   Hyperlipidemia    Hypertension    Hyponatremia 11/25/2016   Hypothyroidism    Lumbar stenosis    Nausea 08/23/2019   Palpitations 11/25/2016   Personal history of radiation therapy 2018   LEFT lumpectomy   Skin cancer 2016,2017,2018   face, back of neck, shoulder, leg, hand   Sleep apnea    does not and will not use a cpap   Thyroid disease     Past Surgical History: Past Surgical History:  Procedure Laterality Date   ABDOMINAL HYSTERECTOMY  1982   APPENDECTOMY  1940   BREAST BIOPSY Left 04/09/2017   [facility]   BREAST LUMPECTOMY Left 04/30/2017   [facility], clear margins, negative LN   BREAST LUMPECTOMY WITH NEEDLE LOCALIZATION AND AXILLARY SENTINEL LYMPH NODE BX Left 04/30/2017   Procedure: LEFT BREAST LUMPECTOMY WITH NEEDLE LOCALIZATION AND LEFT AXILLARY SENTINEL LYMPH NODE BX;  Surgeon: Luretha Murphy, MD;  Location: [facility] ORS;  Service: General;  Laterality: Left;   CHOLECYSTECTOMY     EYE SURGERY Bilateral 2003   cataract extraction   implants  2003   dental, not removable   JOINT REPLACEMENT Bilateral 2003,2012   total hip replacements   JOINT REPLACEMENT Right 2014   right total knee replacement   SKIN CANCER EXCISION     left hand and right lower leg  THYROIDECTOMY, PARTIAL  2003   TONSILLECTOMY      Allergies: Allergies as of 01/26/2023 - Review Complete 12/21/2022  Allergen Reaction Noted   Elemental sulfur Hives and Shortness Of Breath 11/08/2014   Iodinated contrast media Hives, Other (See Comments), and Shortness Of Breath 08/31/2013   Codeine Nausea And Vomiting 11/13/2014   Morphine Nausea And Vomiting 11/13/2014   Oxycodone Nausea And  Vomiting and Other (See Comments) 11/08/2014   Penicillins Other (See Comments) 11/08/2014   Sulfa antibiotics Rash 11/13/2014    Medications: Outpatient Encounter Medications as of 01/26/2023  Medication Sig   acetaminophen (TYLENOL) 500 MG tablet Take 500 mg by mouth every 8 (eight) hours as needed (takes only as needed).   anastrozole (ARIMIDEX) 1 MG tablet Take 1 tablet (1 mg total) by mouth daily.   aspirin EC 81 MG EC tablet Take 1 tablet (81 mg total) by mouth daily.   atorvastatin (LIPITOR) 20 MG tablet Take by mouth.   Coenzyme Q10 (CO Q 10 PO) Take 100 mg by mouth daily.    Continuous Glucose Sensor (FREESTYLE LIBRE 2 SENSOR) MISC PLEASE SEE ATTACHED FOR DETAILED DIRECTIONS   diltiazem (CARDIZEM CD) 300 MG 24 hr capsule Take 1 capsule (300 mg total) by mouth daily.   docusate sodium (COLACE) 100 MG capsule Take 200 mg by mouth 2 (two) times daily.   econazole nitrate 1 % cream Use 1 % CREAM   FARXIGA 10 MG TABS tablet Take 1 tablet by mouth daily.   levothyroxine (SYNTHROID) 50 MCG tablet Take 1 tablet (50 mcg total) by mouth daily.   losartan (COZAAR) 50 MG tablet TAKE 1 TABLET BY MOUTH EVERY DAY   metoprolol succinate (TOPROL-XL) 25 MG 24 hr tablet Take 1 tablet by mouth daily.   omeprazole (PRILOSEC) 20 MG capsule Take 20 mg by mouth daily as needed (for heartburn).    triamcinolone cream (KENALOG) 0.1 % Apply 1 Application topically 2 (two) times daily.   triamterene-hydrochlorothiazide (MAXZIDE-25) 37.5-25 MG tablet Take 0.5 tablets by mouth daily.   No facility-administered encounter medications on file as of 01/26/2023.    Social History: Social History   Tobacco Use   Smoking status: Former    Current packs/day: 0.00    Types: Cigarettes    Quit date: 06/29/1985    Years since quitting: 37.5   Smokeless tobacco: Never  Vaping Use   Vaping status: Never Used  Substance Use Topics   Alcohol use: Yes    Alcohol/week: 1.0 - 2.0 standard drink of alcohol    Types:  1 - 2 Standard drinks or equivalent per week    Comment: wine 3 times a week   Drug use: No    Family Medical History: Family History  Problem Relation Age of Onset   Heart failure Mother    Heart attack Father    Diabetes Brother    Heart disease Brother    Melanoma Brother    Cancer Maternal Uncle    Diabetes Paternal Aunt    Heart disease Paternal Aunt    Heart disease Paternal Grandmother    Diabetes Maternal Aunt    Heart attack Maternal Aunt    Diabetes Maternal Aunt    Diabetes Paternal Aunt    Heart disease Paternal Aunt    Breast cancer Neg Hx     Physical Examination: @VITALWITHPAIN @  General: Patient is well developed, well nourished, calm, collected, and in no apparent distress. Attention to examination is appropriate.  Psychiatric: Patient is non-anxious.  Head:  Pupils equal, round, and reactive to light.  ENT:  Oral mucosa appears well hydrated.  Neck:   Supple.  ***Full range of motion.  Respiratory: Patient is breathing without any difficulty.  Extremities: No edema.  Vascular: Palpable dorsal pedal pulses.  Skin:   On exposed skin, there are no abnormal skin lesions.  NEUROLOGICAL:     Awake, alert, oriented to person, place, and time.  Speech is clear and fluent. Fund of knowledge is appropriate.   Cranial Nerves: Pupils equal round and reactive to light.  Facial tone is symmetric.  Facial sensation is symmetric.  ROM of spine: ***full.  Palpation of spine: ***non tender.    Strength: Side Biceps Triceps Deltoid Interossei Grip Wrist Ext. Wrist Flex.  R 5 5 5 5 5 5 5   L 5 5 5 5 5 5 5    Side Iliopsoas Quads Hamstring PF DF EHL  R 5 5 5 5 5 5   L 5 5 5 5 5 5    Reflexes are ***2+ and symmetric at the biceps, triceps, brachioradialis, patella and achilles.   Hoffman's is absent.  Clonus is not present.  Toes are down-going.  Bilateral upper and lower extremity sensation is intact to light touch.    Gait is normal.   No difficulty with  tandem gait.   No evidence of dysmetria noted.  Medical Decision Making  Imaging: ***  I have personally reviewed the images and agree with the above interpretation.  Assessment and Plan: Ms. Haye is a pleasant 89 y.o. female with ***    Thank you for involving me in the care of this patient.   I spent a total of *** minutes in both face-to-face and non-face-to-face activities for this visit on the date of this encounter.   [facility]

## 2023-01-19 ENCOUNTER — Other Ambulatory Visit: Payer: Self-pay | Admitting: Family

## 2023-01-19 DIAGNOSIS — E782 Mixed hyperlipidemia: Secondary | ICD-10-CM

## 2023-01-19 DIAGNOSIS — E039 Hypothyroidism, unspecified: Secondary | ICD-10-CM

## 2023-01-19 DIAGNOSIS — E1165 Type 2 diabetes mellitus with hyperglycemia: Secondary | ICD-10-CM

## 2023-01-19 DIAGNOSIS — I152 Hypertension secondary to endocrine disorders: Secondary | ICD-10-CM

## 2023-01-19 DIAGNOSIS — E559 Vitamin D deficiency, unspecified: Secondary | ICD-10-CM

## 2023-01-19 DIAGNOSIS — E538 Deficiency of other specified B group vitamins: Secondary | ICD-10-CM

## 2023-01-20 ENCOUNTER — Other Ambulatory Visit: Payer: PPO

## 2023-01-21 ENCOUNTER — Ambulatory Visit (INDEPENDENT_AMBULATORY_CARE_PROVIDER_SITE_OTHER): Payer: PPO | Admitting: Family

## 2023-01-21 VITALS — BP 140/80 | HR 105 | Ht 59.0 in | Wt 182.0 lb

## 2023-01-21 DIAGNOSIS — E039 Hypothyroidism, unspecified: Secondary | ICD-10-CM

## 2023-01-21 DIAGNOSIS — M5137 Other intervertebral disc degeneration, lumbosacral region: Secondary | ICD-10-CM

## 2023-01-21 DIAGNOSIS — R350 Frequency of micturition: Secondary | ICD-10-CM

## 2023-01-21 DIAGNOSIS — I10 Essential (primary) hypertension: Secondary | ICD-10-CM

## 2023-01-21 DIAGNOSIS — K5904 Chronic idiopathic constipation: Secondary | ICD-10-CM

## 2023-01-21 DIAGNOSIS — E1165 Type 2 diabetes mellitus with hyperglycemia: Secondary | ICD-10-CM

## 2023-01-21 LAB — HEMOGLOBIN A1C
Est. average glucose Bld gHb Est-mCnc: 128 mg/dL
Hgb A1c MFr Bld: 6.1 % — ABNORMAL HIGH (ref 4.8–5.6)

## 2023-01-21 LAB — LIPID PANEL
Chol/HDL Ratio: 3.4 ratio (ref 0.0–4.4)
Cholesterol, Total: 149 mg/dL (ref 100–199)
HDL: 44 mg/dL (ref >39)
LDL Chol Calc (NIH): 87 mg/dL (ref 0–99)
VLDL Cholesterol Cal: 18 mg/dL (ref 5–40)

## 2023-01-21 LAB — CBC WITH DIFFERENTIAL
Basophils Absolute: 0.1 10*3/uL (ref 0.0–0.2)
EOS (ABSOLUTE): 0.2 10*3/uL (ref 0.0–0.4)
Eos: 3 %
Hematocrit: 44.9 % (ref 34.0–46.6)
Hemoglobin: 15 g/dL (ref 11.1–15.9)
Immature Grans (Abs): 0 10*3/uL (ref 0.0–0.1)
Lymphocytes Absolute: 1.3 10*3/uL (ref 0.7–3.1)
Lymphs: 21 %
MCH: 30.8 pg (ref 26.6–33.0)
MCHC: 33.4 g/dL (ref 31.5–35.7)
MCV: 92 fL (ref 79–97)
Monocytes Absolute: 0.6 10*3/uL (ref 0.1–0.9)
Neutrophils: 66 %
RBC: 4.87 x10E6/uL (ref 3.77–5.28)
RDW: 12.3 % (ref 11.7–15.4)
WBC: 6.2 10*3/uL (ref 3.4–10.8)

## 2023-01-21 LAB — CMP14+EGFR
AST: 22 IU/L (ref 0–40)
BUN/Creatinine Ratio: 29 — ABNORMAL HIGH (ref 12–28)
BUN: 25 mg/dL (ref 8–27)
Bilirubin Total: 0.3 mg/dL (ref 0.0–1.2)
CO2: 24 mmol/L (ref 20–29)
Chloride: 102 mmol/L (ref 96–106)
Creatinine, Ser: 0.87 mg/dL (ref 0.57–1.00)
Globulin, Total: 2.2 g/dL (ref 1.5–4.5)
Glucose: 113 mg/dL — ABNORMAL HIGH (ref 70–99)
Potassium: 4.1 mmol/L (ref 3.5–5.2)
Sodium: 140 mmol/L (ref 134–144)
Total Protein: 6.5 g/dL (ref 6.0–8.5)

## 2023-01-21 LAB — TSH: TSH: 1.33 u[IU]/mL (ref 0.450–4.500)

## 2023-01-21 LAB — POCT URINALYSIS DIPSTICK
Bilirubin, UA: NEGATIVE
Blood, UA: NEGATIVE
Glucose, UA: POSITIVE — AB
Ketones, UA: NEGATIVE
Leukocytes, UA: NEGATIVE
Nitrite, UA: NEGATIVE
Protein, UA: NEGATIVE
Spec Grav, UA: 1.01 (ref 1.010–1.025)
Urobilinogen, UA: 0.2 E.U./dL
pH, UA: 6 (ref 5.0–8.0)

## 2023-01-21 LAB — VITAMIN B12: Vitamin B-12: 1252 pg/mL — ABNORMAL HIGH (ref 232–1245)

## 2023-01-21 LAB — VITAMIN D 25 HYDROXY (VIT D DEFICIENCY, FRACTURES): Vit D, 25-Hydroxy: 36.3 ng/mL (ref 30.0–100.0)

## 2023-01-21 MED ORDER — EMPAGLIFLOZIN 25 MG PO TABS: 25.0000 mg | ORAL_TABLET | Freq: Every day | ORAL | 1 refills | Status: DC

## 2023-01-26 ENCOUNTER — Ambulatory Visit: Payer: PPO | Admitting: Neurosurgery

## 2023-01-31 ENCOUNTER — Encounter: Payer: Self-pay | Admitting: Family

## 2023-01-31 DIAGNOSIS — K5904 Chronic idiopathic constipation: Secondary | ICD-10-CM | POA: Insufficient documentation

## 2023-01-31 NOTE — Assessment & Plan Note (Signed)
Patient stable.  Well controlled with current therapy.   Continue current meds.  

## 2023-01-31 NOTE — Assessment & Plan Note (Signed)
Blood pressure well controlled with current medications.  Continue current therapy.  Will reassess at follow up.  

## 2023-01-31 NOTE — Assessment & Plan Note (Signed)
Patient stable.  Well controlled with current therapy.  Continue current bowel regimen.  Reassess at follow up.

## 2023-01-31 NOTE — Progress Notes (Signed)
Established Patient Office Visit  Subjective:  Patient ID: Kelsey Young, female    DOB: 1932/06/04  Age: 89 y.o. MRN: [id number]  Chief Complaint  Patient presents with   Follow-up    3 mo f/u    Patient is here today for her 3 months follow up.  She has been feeling fairly well since last appointment.   She does not have additional concerns to discuss today.  She asks if we can work to figure out an alternative to the [place], since her insurance is no longer covering it and she did not qualify for the astrazeneca PAP this year.   Labs were done a few days ago, so we will discuss these in detail today. She needs refills.   I have reviewed her active problem list, medication list, allergies, notes from last encounter, lab results for her appointment today.      No other concerns at this time.   Past Medical History:  Diagnosis Date   Breast cancer (HCC)    Cancer (HCC) 03/2017   left breast   Chest pain, rule out acute myocardial infarction 11/23/2016   Complication of anesthesia    has been slow to wake up   COPD (chronic obstructive pulmonary disease) (HCC)    Diabetes mellitus without complication (HCC)    Dyspnea    Edema leg 03/2017   bilateral swelling   Family history of adverse reaction to anesthesia    son had surgery and heart stopped during procedure   GERD (gastroesophageal reflux disease)    Heart murmur    no treatment   Hemorrhoids 03/2017   History of kidney stones    had eswl   Hyperlipidemia    Hypertension    Hyponatremia 11/25/2016   Hypothyroidism    Lumbar stenosis    Nausea 08/23/2019   Palpitations 11/25/2016   Personal history of radiation therapy 2018   LEFT lumpectomy   Skin cancer 2016,2017,2018   face, back of neck, shoulder, leg, hand   Sleep apnea    does not and will not use a cpap   Thyroid disease     Past Surgical History:  Procedure Laterality Date   ABDOMINAL HYSTERECTOMY  1982   APPENDECTOMY  1940   BREAST  BIOPSY Left 04/09/2017   [facility]   BREAST LUMPECTOMY Left 04/30/2017   [facility], clear margins, negative LN   BREAST LUMPECTOMY WITH NEEDLE LOCALIZATION AND AXILLARY SENTINEL LYMPH NODE BX Left 04/30/2017   Procedure: LEFT BREAST LUMPECTOMY WITH NEEDLE LOCALIZATION AND LEFT AXILLARY SENTINEL LYMPH NODE BX;  Surgeon: Luretha Murphy, MD;  Location: [facility] ORS;  Service: General;  Laterality: Left;   CHOLECYSTECTOMY     EYE SURGERY Bilateral 2003   cataract extraction   implants  2003   dental, not removable   JOINT REPLACEMENT Bilateral 2003,2012   total hip replacements   JOINT REPLACEMENT Right 2014   right total knee replacement   SKIN CANCER EXCISION     left hand and right lower leg   THYROIDECTOMY, PARTIAL  2003   TONSILLECTOMY      Social History   Socioeconomic History   Marital status: Widowed    Spouse name: Not on file   Number of children: Not on file   Years of education: Not on file   Highest education level: Not on file  Occupational History   Not on file  Tobacco Use   Smoking status: Former    Current packs/day: 0.00  Types: Cigarettes    Quit date: 06/29/1985    Years since quitting: 37.6   Smokeless tobacco: Never  Vaping Use   Vaping status: Never Used  Substance and Sexual Activity   Alcohol use: Yes    Alcohol/week: 1.0 - 2.0 standard drink of alcohol    Types: 1 - 2 Standard drinks or equivalent per week    Comment: wine 3 times a week   Drug use: No   Sexual activity: Never  Other Topics Concern   Not on file  Social History Narrative   Not on file   Social Determinants of Health   Financial Resource Strain: Not on file  Food Insecurity: Not on file  Transportation Needs: Not on file  Physical Activity: Not on file  Stress: Not on file  Social Connections: Not on file  Intimate Partner Violence: Not on file    Family History  Problem Relation Age of Onset   Heart failure Mother    Heart attack Father    Diabetes Brother    Heart disease  Brother    Melanoma Brother    Cancer Maternal Uncle    Diabetes Paternal Aunt    Heart disease Paternal Aunt    Heart disease Paternal Grandmother    Diabetes Maternal Aunt    Heart attack Maternal Aunt    Diabetes Maternal Aunt    Diabetes Paternal Aunt    Heart disease Paternal Aunt    Breast cancer Neg Hx     Allergies  Allergen Reactions   Elemental Sulfur Hives and Shortness Of Breath   Iodinated Contrast Media Hives, Other (See Comments) and Shortness Of Breath    Restless. Difficulty breathing. Topical betadine okay  Restless  Restless  Restless. Difficulty breathing. Topical betadine okay    Restless   Codeine Nausea And Vomiting    With high doses, passes out   Morphine Nausea And Vomiting    With high doses, passes out   Oxycodone Nausea And Vomiting and Other (See Comments)    Dizzy and with high doses, passes out.   Penicillins Other (See Comments)    "Hardened lump" Has patient had a PCN reaction causing immediate rash, facial/tongue/throat swelling, SOB or lightheadedness with hypotension: No Has patient had a PCN reaction causing severe rash involving mucus membranes or skin necrosis: No Has patient had a PCN reaction that required hospitalization No Has patient had a PCN reaction occurring within the last 10 years: No If all of the above answers are "NO", then may proceed with Cephalosporin use.     Sulfa Antibiotics Rash    Review of Systems  All other systems reviewed and are negative.      Objective:   BP (!) 140/80   Pulse (!) 105   Ht 4\' 11"  (1.499 m)   Wt 182 lb (82.6 kg)   SpO2 96%   BMI 36.76 kg/m   Vitals:   01/21/23 1314  BP: (!) 140/80  Pulse: (!) 105  Height: 4\' 11"  (1.499 m)  Weight: 182 lb (82.6 kg)  SpO2: 96%  BMI (Calculated): 36.74    Physical Exam Vitals and nursing note reviewed.  Constitutional:      Appearance: Normal appearance. She is normal weight.  HENT:     Head: Normocephalic.  Eyes:     Pupils:  Pupils are equal, round, and reactive to light.  Cardiovascular:     Rate and Rhythm: Normal rate.  Pulmonary:     Effort: Pulmonary effort is  normal.  Neurological:     General: No focal deficit present.     Mental Status: She is alert and oriented to person, place, and time. Mental status is at baseline.  Psychiatric:        Mood and Affect: Mood normal.        Behavior: Behavior normal.        Thought Content: Thought content normal.        Judgment: Judgment normal.      Results for orders placed or performed in visit on 01/21/23  POCT Urinalysis Dipstick (81002)  Result Value Ref Range   Color, UA     Clarity, UA     Glucose, UA Positive (A) Negative   Bilirubin, UA negative    Ketones, UA negative    Spec Grav, UA 1.010 1.010 - 1.025   Blood, UA negative    pH, UA 6.0 5.0 - 8.0   Protein, UA Negative Negative   Urobilinogen, UA 0.2 0.2 or 1.0 E.U./dL   Nitrite, UA negative    Leukocytes, UA Negative Negative   Appearance     Odor      Recent Results (from the past 2160 hour(s))  I-STAT creatinine     Status: None   Collection Time: 12/21/22  1:07 PM  Result Value Ref Range   Creatinine, Ser 0.90 0.44 - 1.00 mg/dL  Vitamin N82     Status: Abnormal   Collection Time: 01/20/23 10:24 AM  Result Value Ref Range   Vitamin B-12 1,252 (H) 232 - 1,245 pg/mL  Hemoglobin A1c     Status: Abnormal   Collection Time: 01/20/23 10:24 AM  Result Value Ref Range   Hgb A1c MFr Bld 6.1 (H) 4.8 - 5.6 %    Comment:          Prediabetes: 5.7 - 6.4          Diabetes: >6.4          Glycemic control for adults with diabetes: <7.0    Est. average glucose Bld gHb Est-mCnc 128 mg/dL  TSH     Status: None   Collection Time: 01/20/23 10:24 AM  Result Value Ref Range   TSH 1.330 0.450 - 4.500 uIU/mL  CMP14+EGFR     Status: Abnormal   Collection Time: 01/20/23 10:24 AM  Result Value Ref Range   Glucose 113 (H) 70 - 99 mg/dL   BUN 25 8 - 27 mg/dL   Creatinine, Ser 9.56 0.57 - 1.00  mg/dL   eGFR 64 >21 HY/QMV/7.84   BUN/Creatinine Ratio 29 (H) 12 - 28   Sodium 140 134 - 144 mmol/L   Potassium 4.1 3.5 - 5.2 mmol/L   Chloride 102 96 - 106 mmol/L   CO2 24 20 - 29 mmol/L   Calcium 9.8 8.7 - 10.3 mg/dL   Total Protein 6.5 6.0 - 8.5 g/dL   Albumin 4.3 3.7 - 4.7 g/dL   Globulin, Total 2.2 1.5 - 4.5 g/dL   Bilirubin Total 0.3 0.0 - 1.2 mg/dL   Alkaline Phosphatase 110 44 - 121 IU/L   AST 22 0 - 40 IU/L   ALT 21 0 - 32 IU/L  CBC With Differential     Status: None   Collection Time: 01/20/23 10:24 AM  Result Value Ref Range   WBC 6.2 3.4 - 10.8 x10E3/uL   RBC 4.87 3.77 - 5.28 x10E6/uL   Hemoglobin 15.0 11.1 - 15.9 g/dL   Hematocrit 69.6 29.5 - 46.6 %  MCV 92 79 - 97 fL   MCH 30.8 26.6 - 33.0 pg   MCHC 33.4 31.5 - 35.7 g/dL   RDW 20.2 54.2 - 70.6 %   Neutrophils 66 Not Estab. %   Lymphs 21 Not Estab. %   Monocytes 9 Not Estab. %   Eos 3 Not Estab. %   Basos 1 Not Estab. %   Neutrophils Absolute 4.0 1.4 - 7.0 x10E3/uL   Lymphocytes Absolute 1.3 0.7 - 3.1 x10E3/uL   Monocytes Absolute 0.6 0.1 - 0.9 x10E3/uL   EOS (ABSOLUTE) 0.2 0.0 - 0.4 x10E3/uL   Basophils Absolute 0.1 0.0 - 0.2 x10E3/uL   Immature Granulocytes 0 Not Estab. %   Immature Grans (Abs) 0.0 0.0 - 0.1 x10E3/uL    Comment: **Effective January 25, 2023, profile [id number] CBC/Differential**   (No Platelet) will be made non-orderable. [facility] Offers:   [id number] CBC With Differential/Platelet   VITAMIN D 25 Hydroxy (Vit-D Deficiency, Fractures)     Status: None   Collection Time: 01/20/23 10:24 AM  Result Value Ref Range   Vit D, 25-Hydroxy 36.3 30.0 - 100.0 ng/mL    Comment: Vitamin D deficiency has been defined by the Institute of Medicine and an Endocrine Society practice guideline as a level of serum 25-OH vitamin D less than 20 ng/mL (1,2). The Endocrine Society went on to further define vitamin D insufficiency as a level between 21 and 29 ng/mL (2). 1. IOM (Institute of Medicine). 2010. Dietary  reference    intakes for calcium and D. [place]: The    [employer]. 2. Holick MF, Binkley Loco, Bischoff-Ferrari HA, et al.    Evaluation, treatment, and prevention of vitamin D    deficiency: an Endocrine Society clinical practice    guideline. JCEM. 2011 Jul; 96(7):1911-30.   Lipid panel     Status: None   Collection Time: 01/20/23 10:24 AM  Result Value Ref Range   Cholesterol, Total 149 100 - 199 mg/dL   Triglycerides 98 0 - 149 mg/dL   HDL 44 >31 mg/dL   VLDL Cholesterol Cal 18 5 - 40 mg/dL   LDL Chol Calc (NIH) 87 0 - 99 mg/dL   Chol/HDL Ratio 3.4 0.0 - 4.4 ratio    Comment:                                   T. Chol/HDL Ratio                                             Men  Women                               1/2 Avg.Risk  3.4    3.3                                   Avg.Risk  5.0    4.4                                2X Avg.Risk  9.6    7.1  3X Avg.Risk 23.4   11.0   POCT Urinalysis Dipstick (40981)     Status: Abnormal   Collection Time: 01/21/23  2:02 PM  Result Value Ref Range   Color, UA     Clarity, UA     Glucose, UA Positive (A) Negative   Bilirubin, UA negative    Ketones, UA negative    Spec Grav, UA 1.010 1.010 - 1.025   Blood, UA negative    pH, UA 6.0 5.0 - 8.0   Protein, UA Negative Negative   Urobilinogen, UA 0.2 0.2 or 1.0 E.U./dL   Nitrite, UA negative    Leukocytes, UA Negative Negative   Appearance     Odor         Assessment & Plan:   Problem List Items Addressed This Visit       Active Problems   DDD (degenerative disc disease), lumbosacral    Patient stable.  Well controlled with current therapy.   Continue current meds.       Type 2 diabetes mellitus with hyperglycemia (HCC)    Continue current diabetes POC, as patient has been well controlled on current regimen.  Will adjust meds if needed based on labs.       Relevant Medications   empagliflozin (JARDIANCE) 25 MG TABS tablet    BP (high blood pressure)    Blood pressure well controlled with current medications.  Continue current therapy.  Will reassess at follow up.       Hypothyroidism    Patient stable.  Well controlled with current therapy.   Continue current meds.       Chronic idiopathic constipation    Patient stable.  Well controlled with current therapy.  Continue current bowel regimen.  Reassess at follow up.       Other Visit Diagnoses     Urination frequency    -  Primary   UA in office today WNL.  Suspect this is simple OAB.  Will reassess at follow up.   Relevant Orders   POCT Urinalysis Dipstick (19147) (Completed)       Return in about 3 months (around 04/23/2023).   Total time spent: 30 minutes  Miki Kins, FNP  01/21/2023   This document may have been prepared by [facility] Voice Recognition software and as such may include unintentional dictation errors.

## 2023-01-31 NOTE — Assessment & Plan Note (Signed)
Continue current diabetes POC, as patient has been well controlled on current regimen.  Will adjust meds if needed based on labs.  

## 2023-02-01 NOTE — Progress Notes (Signed)
Referring Physician:  Alwyn Pea, MD [address] [place],  [place] [address]  Primary Physician:  Miki Kins, FNP  History of Present Illness: 02/01/2023 Ms. Kelsey Young is an 89 y.o with a history of HTN, COPD, HLD, Type II DM. Sleep apnea, GERD, obesity, and long standing lumbosacral complaints who is here today with a chief complaint of low back pain that will radiate into the bilateral posterior thigh, right worse than left. She also has numbness in the bilateral feet.  This is been going on for about 10 years, maybe longer but has worsened over the last 2 years.  The patient was recently seen by her cardiologist and after discussing her symptoms recommended further evaluation by neurosurgery due to the severity of her pain.  She states it is worse with standing and walking and improved some with sitting or with heat.  She has not had any lasting results with either physical therapy or injections.  Conservative measures: she has seen a Chiropractor   Physical therapy: has participated in many times without relief, but nothing recently   Multimodal medical therapy including regular antiinflammatories: tylenol  Injections: has received epidural steroid injections 07/29/2021: Bilateral S1 transforaminal ESI (no relief)  08/24/2018: Bilateral L4-5 and L5-S1 facet joint injections (postinjection flare) 06/17/2018: Bilateral S1 transforaminal ESI (no relief) 05/17/2018: Bilateral S1 transforaminal ESI (relief x3 days) 01/11/2015: Left L5-S1 and right S1 transforaminal epidural (relief x2 weeks) 11/29/2014: Left L5-S1 and right S1 transforaminal epidural (relief x2 weeks)  07/09/2010: L4-5 interlaminar epidural steroid injection (Dr. Pernell Dupre) 05/29/2010: L5-S1 interlaminar epidural steroid injection (Dr. Pernell Dupre)     Past Surgery: denies  Kelsey Young has no symptoms of cervical myelopathy.  The symptoms are causing a significant impact on the patient's life.    Review of Systems:  A 10 point review of systems is negative, except for the pertinent positives and negatives detailed in the HPI.  Past Medical History: Past Medical History:  Diagnosis Date   Breast cancer (HCC)    Cancer (HCC) 03/2017   left breast   Chest pain, rule out acute myocardial infarction 11/23/2016   Complication of anesthesia    has been slow to wake up   COPD (chronic obstructive pulmonary disease) (HCC)    Diabetes mellitus without complication (HCC)    Dyspnea    Edema leg 03/2017   bilateral swelling   Family history of adverse reaction to anesthesia    son had surgery and heart stopped during procedure   GERD (gastroesophageal reflux disease)    Heart murmur    no treatment   Hemorrhoids 03/2017   History of kidney stones    had eswl   Hyperlipidemia    Hypertension    Hyponatremia 11/25/2016   Hypothyroidism    Lumbar stenosis    Nausea 08/23/2019   Palpitations 11/25/2016   Personal history of radiation therapy 2018   LEFT lumpectomy   Skin cancer 2016,2017,2018   face, back of neck, shoulder, leg, hand   Sleep apnea    does not and will not use a cpap   Thyroid disease     Past Surgical History: Past Surgical History:  Procedure Laterality Date   ABDOMINAL HYSTERECTOMY  1982   APPENDECTOMY  1940   BREAST BIOPSY Left 04/09/2017   [facility]   BREAST LUMPECTOMY Left 04/30/2017   [facility], clear margins, negative LN   BREAST LUMPECTOMY WITH NEEDLE LOCALIZATION AND AXILLARY SENTINEL LYMPH NODE BX Left 04/30/2017   Procedure:  LEFT BREAST LUMPECTOMY WITH NEEDLE LOCALIZATION AND LEFT AXILLARY SENTINEL LYMPH NODE BX;  Surgeon: Luretha Murphy, MD;  Location: [facility] ORS;  Service: General;  Laterality: Left;   CHOLECYSTECTOMY     EYE SURGERY Bilateral 2003   cataract extraction   implants  2003   dental, not removable   JOINT REPLACEMENT Bilateral 2003,2012   total hip replacements   JOINT REPLACEMENT Right 2014   right total knee replacement   SKIN  CANCER EXCISION     left hand and right lower leg   THYROIDECTOMY, PARTIAL  2003   TONSILLECTOMY      Allergies: Allergies as of 02/09/2023 - Review Complete 01/31/2023  Allergen Reaction Noted   Elemental sulfur Hives and Shortness Of Breath 11/08/2014   Iodinated contrast media Hives, Other (See Comments), and Shortness Of Breath 08/31/2013   Codeine Nausea And Vomiting 11/13/2014   Morphine Nausea And Vomiting 11/13/2014   Oxycodone Nausea And Vomiting and Other (See Comments) 11/08/2014   Penicillins Other (See Comments) 11/08/2014   Sulfa antibiotics Rash 11/13/2014    Medications: Outpatient Encounter Medications as of 02/09/2023  Medication Sig   acetaminophen (TYLENOL) 500 MG tablet Take 500 mg by mouth every 8 (eight) hours as needed (takes only as needed).   anastrozole (ARIMIDEX) 1 MG tablet Take 1 tablet (1 mg total) by mouth daily.   aspirin EC 81 MG EC tablet Take 1 tablet (81 mg total) by mouth daily.   atorvastatin (LIPITOR) 20 MG tablet Take by mouth.   Coenzyme Q10 (CO Q 10 PO) Take 100 mg by mouth daily.    Continuous Glucose Sensor (FREESTYLE LIBRE 2 SENSOR) MISC PLEASE SEE ATTACHED FOR DETAILED DIRECTIONS   diltiazem (CARDIZEM CD) 300 MG 24 hr capsule Take 1 capsule (300 mg total) by mouth daily.   docusate sodium (COLACE) 100 MG capsule Take 200 mg by mouth 2 (two) times daily.   econazole nitrate 1 % cream Use 1 % CREAM   empagliflozin (JARDIANCE) 25 MG TABS tablet Take 1 tablet (25 mg total) by mouth daily before breakfast.   levothyroxine (SYNTHROID) 50 MCG tablet Take 1 tablet (50 mcg total) by mouth daily.   losartan (COZAAR) 50 MG tablet TAKE 1 TABLET BY MOUTH EVERY DAY   metoprolol succinate (TOPROL-XL) 25 MG 24 hr tablet Take 1 tablet by mouth daily. (Patient not taking: Reported on 01/21/2023)   omeprazole (PRILOSEC) 20 MG capsule Take 20 mg by mouth daily as needed (for heartburn).    triamcinolone cream (KENALOG) 0.1 % Apply 1 Application topically 2  (two) times daily.   triamterene-hydrochlorothiazide (MAXZIDE-25) 37.5-25 MG tablet Take 0.5 tablets by mouth daily.   No facility-administered encounter medications on file as of 02/09/2023.    Social History: Social History   Tobacco Use   Smoking status: Former    Current packs/day: 0.00    Types: Cigarettes    Quit date: 06/29/1985    Years since quitting: 37.6   Smokeless tobacco: Never  Vaping Use   Vaping status: Never Used  Substance Use Topics   Alcohol use: Yes    Alcohol/week: 1.0 - 2.0 standard drink of alcohol    Types: 1 - 2 Standard drinks or equivalent per week    Comment: wine 3 times a week   Drug use: No    Family Medical History: Family History  Problem Relation Age of Onset   Heart failure Mother    Heart attack Father    Diabetes Brother  Heart disease Brother    Melanoma Brother    Cancer Maternal Uncle    Diabetes Paternal Aunt    Heart disease Paternal Aunt    Heart disease Paternal Grandmother    Diabetes Maternal Aunt    Heart attack Maternal Aunt    Diabetes Maternal Aunt    Diabetes Paternal Aunt    Heart disease Paternal Aunt    Breast cancer Neg Hx     Physical Examination: Today's Vitals   02/09/23 1359  BP: 132/82  Weight: 81 kg  Height: 4\' 11"  (1.499 m)  PainSc: 5   PainLoc: Back   Body mass index is 36.07 kg/m.   General: Patient is well developed, well nourished, calm, collected, and in no apparent distress. Attention to examination is appropriate.  Psychiatric: Patient is non-anxious.  Head:  Pupils equal, round, and reactive to light.  ENT:  Oral mucosa appears well hydrated.  Neck:   Supple.  Full range of motion.  Respiratory: Patient is breathing without any difficulty.  Extremities: No edema.  Vascular: Palpable dorsal pedal pulses.  Skin:   On exposed skin, there are no abnormal skin lesions.  NEUROLOGICAL:     Awake, alert, oriented to person, place, and time.  Speech is clear and fluent. Fund of  knowledge is appropriate.   Cranial Nerves: Pupils equal round and reactive to light.  Facial tone is symmetric.  Facial sensation is symmetric.  ROM of spine: limited flex/ex due to pain  Palpation of spine: non tender.    Strength: Side Biceps Triceps Deltoid Interossei Grip Wrist Ext. Wrist Flex.  R 5 5 5 5 5 5 5   L 5 5 5 5 5 5 5    Side Iliopsoas Quads Hamstring PF DF EHL  R 5 5 5 5 5 5   L 5 5 5 5 5 5    Reflexes are 1+ and symmetric at the biceps, triceps, brachioradialis, patella and achilles.   Hoffman's is absent.  Clonus is not present.  Toes are down-going.  Bilateral upper and lower extremity sensation is intact to light touch.    Ambulates with an antalgic gait  Medical Decision Making  Imaging: MRI L spine 04/08/2018  FINDINGS: Segmentation:  Standard   Alignment: 6 mm anterolisthesis L4-5. 3 mm retrolisthesis T12-L1. 3 mm anterolisthesis L5-S1.   Vertebrae:  No worrisome osseous lesion.   Conus medullaris and cauda equina: Conus extends to the L1-L2 level. Conus and cauda equina appear normal.   Paraspinal and other soft tissues: Unremarkable.   Disc levels:   L1-L2: Disc desiccation. Fairly good preservation of disc height. Annular bulge. No impingement.   L2-L3: Disc desiccation. Annular bulge. Facet arthropathy. No impingement.   L3-L4: Mild loss of disc height and signal. Annular bulge. Shallow foraminal protrusion to the LEFT. Posterior element hypertrophy. No definite impingement.   L4-L5: Disc space narrowing. 6 mm anterolisthesis, facet mediated. Uncovering of the disc with central protrusion. Posterior element hypertrophy. Severe stenosis. BILATERAL L5 and L4 neural impingement.   L5-S1: 3 mm anterolisthesis, facet mediated. Preserved disc height. Annular bulge. Posterior element hypertrophy. No definite compressive subarticular zone or foraminal zone narrowing.   IMPRESSION: The dominant abnormality is at L4-5, where 6 mm  anterolisthesis, central protrusion, and element hypertrophy result in severe stenosis with BILATERAL L4 and L5 neural impingement. Mild progression compared with 2016.     Electronically Signed   By: Elsie Stain M.D.   On: 04/08/2018 14:58  I have personally reviewed the images and  agree with the above interpretation.  Assessment and Plan: Ms. Dyer is a pleasant 89 y.o. female with a longstanding history of lumbosacral complaints.  Her MRI from 2019 does show significant lumbar stenosis with associated anterolisthesis at L4-5 and ligamentous hypertrophy.  Her symptoms do correlate with this and are concerning for neurogenic claudication.  We discussed this briefly and discussed the potential of surgical intervention however I would like for her to have an updated MRI given that this 1 is almost 89 years old.  We briefly discussed the potential for surgical intervention however she is apprehensive about this due to having friends responded poorly to back surgery.  She would like to return to clinic to review her MRI in person once completed and discuss further options.  I did discuss that she may need to complete physical therapy prior to being evaluated by surgeon to discuss surgical options.  She was agreeable to this.  Thank you for involving me in the care of this patient.   I spent a total of 30 minutes in both face-to-face and non-face-to-face activities for this visit on the date of this encounter including review of records, review of old imaging, discussion of symptoms, discussion of differential diagnosis, discussion of treatment options, documentation, and order placement.   [facility]

## 2023-02-05 ENCOUNTER — Telehealth: Payer: Self-pay | Admitting: *Deleted

## 2023-02-05 NOTE — Telephone Encounter (Signed)
Patient requests refill for anastrozole. She has 3 tabs left. Order pended per MD approval.

## 2023-02-09 ENCOUNTER — Encounter: Payer: Self-pay | Admitting: Neurosurgery

## 2023-02-09 ENCOUNTER — Ambulatory Visit (INDEPENDENT_AMBULATORY_CARE_PROVIDER_SITE_OTHER): Payer: PPO | Admitting: Neurosurgery

## 2023-02-09 VITALS — BP 132/82 | Ht 59.0 in | Wt 178.6 lb

## 2023-02-09 DIAGNOSIS — M4316 Spondylolisthesis, lumbar region: Secondary | ICD-10-CM | POA: Diagnosis not present

## 2023-02-09 DIAGNOSIS — M48062 Spinal stenosis, lumbar region with neurogenic claudication: Secondary | ICD-10-CM | POA: Diagnosis not present

## 2023-02-16 ENCOUNTER — Ambulatory Visit
Admission: RE | Admit: 2023-02-16 | Discharge: 2023-02-16 | Disposition: A | Discharge status: Home / Self Care | Payer: PPO | Source: Ambulatory Visit | Attending: Neurosurgery | Admitting: Neurosurgery

## 2023-02-16 DIAGNOSIS — M48062 Spinal stenosis, lumbar region with neurogenic claudication: Secondary | ICD-10-CM | POA: Diagnosis not present

## 2023-03-16 ENCOUNTER — Ambulatory Visit: Payer: PPO | Admitting: Neurosurgery

## 2023-03-16 ENCOUNTER — Encounter: Payer: Self-pay | Admitting: Neurosurgery

## 2023-03-16 ENCOUNTER — Encounter: Payer: Self-pay | Admitting: Family

## 2023-03-16 VITALS — BP 122/68 | Ht 59.0 in | Wt 178.6 lb

## 2023-03-16 DIAGNOSIS — M4316 Spondylolisthesis, lumbar region: Secondary | ICD-10-CM

## 2023-03-16 DIAGNOSIS — N2889 Other specified disorders of kidney and ureter: Secondary | ICD-10-CM | POA: Diagnosis not present

## 2023-03-16 DIAGNOSIS — M48062 Spinal stenosis, lumbar region with neurogenic claudication: Secondary | ICD-10-CM

## 2023-03-16 NOTE — Progress Notes (Signed)
Referring Physician:  No referring provider defined for this encounter.  Primary Physician:  Miki Kins, FNP  History of Present Illness: 03/16/23 Kelsey Young is an 89 y.o presenting today to review lumbar MRI.  She continues to have back and right greater than left posterior thigh pain that is worse with activity.  She denies any new or worsening symptoms.  02/09/23 Kelsey Young is an 89 y.o with a history of HTN, COPD, HLD, Type II DM. Sleep apnea, GERD, obesity, and long standing lumbosacral complaints who is here today with a chief complaint of low back pain that will radiate into the bilateral posterior thigh, right worse than left. She also has numbness in the bilateral feet.  This is been going on for about 10 years, maybe longer but has worsened over the last 2 years.  The patient was recently seen by her cardiologist and after discussing her symptoms recommended further evaluation by neurosurgery due to the severity of her pain.  She states it is worse with standing and walking and improved some with sitting or with heat.  She has not had any lasting results with either physical therapy or injections.  Conservative measures: she has seen a Chiropractor   Physical therapy: has participated in many times without relief, but nothing recently   Multimodal medical therapy including regular antiinflammatories: tylenol  Injections: has received epidural steroid injections 07/29/2021: Bilateral S1 transforaminal ESI (no relief)  08/24/2018: Bilateral L4-5 and L5-S1 facet joint injections (postinjection flare) 06/17/2018: Bilateral S1 transforaminal ESI (no relief) 05/17/2018: Bilateral S1 transforaminal ESI (relief x3 days) 01/11/2015: Left L5-S1 and right S1 transforaminal epidural (relief x2 weeks) 11/29/2014: Left L5-S1 and right S1 transforaminal epidural (relief x2 weeks)  07/09/2010: L4-5 interlaminar epidural steroid injection (Dr. Pernell Dupre) 05/29/2010: L5-S1 interlaminar  epidural steroid injection (Dr. Pernell Dupre)     Past Surgery: denies  Kelsey Young has no symptoms of cervical myelopathy.  The symptoms are causing a significant impact on the patient's life.   Review of Systems:  A 10 point review of systems is negative, except for the pertinent positives and negatives detailed in the HPI.  Past Medical History: Past Medical History:  Diagnosis Date   Breast cancer (HCC)    Cancer (HCC) 03/2017   left breast   Chest pain, rule out acute myocardial infarction 11/23/2016   Complication of anesthesia    has been slow to wake up   COPD (chronic obstructive pulmonary disease) (HCC)    Diabetes mellitus without complication (HCC)    Dyspnea    Edema leg 03/2017   bilateral swelling   Family history of adverse reaction to anesthesia    son had surgery and heart stopped during procedure   GERD (gastroesophageal reflux disease)    Heart murmur    no treatment   Hemorrhoids 03/2017   History of kidney stones    had eswl   Hyperlipidemia    Hypertension    Hyponatremia 11/25/2016   Hypothyroidism    Lumbar stenosis    Nausea 08/23/2019   Palpitations 11/25/2016   Personal history of radiation therapy 2018   LEFT lumpectomy   Skin cancer 2016,2017,2018   face, back of neck, shoulder, leg, hand   Sleep apnea    does not and will not use a cpap   Thyroid disease     Past Surgical History: Past Surgical History:  Procedure Laterality Date   ABDOMINAL HYSTERECTOMY  1982   APPENDECTOMY  1940   BREAST BIOPSY  Left 04/09/2017   [facility]   BREAST LUMPECTOMY Left 04/30/2017   [facility], clear margins, negative LN   BREAST LUMPECTOMY WITH NEEDLE LOCALIZATION AND AXILLARY SENTINEL LYMPH NODE BX Left 04/30/2017   Procedure: LEFT BREAST LUMPECTOMY WITH NEEDLE LOCALIZATION AND LEFT AXILLARY SENTINEL LYMPH NODE BX;  Surgeon: Luretha Murphy, MD;  Location: [facility] ORS;  Service: General;  Laterality: Left;   CHOLECYSTECTOMY     EYE SURGERY Bilateral 2003    cataract extraction   implants  2003   dental, not removable   JOINT REPLACEMENT Bilateral 2003,2012   total hip replacements   JOINT REPLACEMENT Right 2014   right total knee replacement   SKIN CANCER EXCISION     left hand and right lower leg   THYROIDECTOMY, PARTIAL  2003   TONSILLECTOMY      Allergies: Allergies as of 03/16/2023 - Review Complete 03/16/2023  Allergen Reaction Noted   Elemental sulfur Hives and Shortness Of Breath 11/08/2014   Iodinated contrast media Hives, Other (See Comments), and Shortness Of Breath 08/31/2013   Codeine Nausea And Vomiting 11/13/2014   Morphine Nausea And Vomiting 11/13/2014   Oxycodone Nausea And Vomiting and Other (See Comments) 11/08/2014   Penicillins Other (See Comments) 11/08/2014   Sulfa antibiotics Rash 11/13/2014    Medications: Outpatient Encounter Medications as of 03/16/2023  Medication Sig   acetaminophen (TYLENOL) 500 MG tablet Take 500 mg by mouth every 8 (eight) hours as needed (takes only as needed).   aspirin EC 81 MG EC tablet Take 1 tablet (81 mg total) by mouth daily.   atorvastatin (LIPITOR) 20 MG tablet Take by mouth.   Coenzyme Q10 (CO Q 10 PO) Take 100 mg by mouth daily.    Continuous Glucose Sensor (FREESTYLE LIBRE 2 SENSOR) MISC PLEASE SEE ATTACHED FOR DETAILED DIRECTIONS   diltiazem (CARDIZEM CD) 300 MG 24 hr capsule Take 1 capsule (300 mg total) by mouth daily.   docusate sodium (COLACE) 100 MG capsule Take 200 mg by mouth 2 (two) times daily.   econazole nitrate 1 % cream Use 1 % CREAM   empagliflozin (JARDIANCE) 25 MG TABS tablet Take 1 tablet (25 mg total) by mouth daily before breakfast.   levothyroxine (SYNTHROID) 50 MCG tablet Take 1 tablet (50 mcg total) by mouth daily.   losartan (COZAAR) 50 MG tablet TAKE 1 TABLET BY MOUTH EVERY DAY   omeprazole (PRILOSEC) 20 MG capsule Take 20 mg by mouth daily as needed (for heartburn).    triamcinolone cream (KENALOG) 0.1 % Apply 1 Application topically 2 (two)  times daily.   triamterene-hydrochlorothiazide (MAXZIDE-25) 37.5-25 MG tablet Take 0.5 tablets by mouth daily.   No facility-administered encounter medications on file as of 03/16/2023.    Social History: Social History   Tobacco Use   Smoking status: Former    Current packs/day: 0.00    Types: Cigarettes    Quit date: 06/29/1985    Years since quitting: 37.7   Smokeless tobacco: Never  Vaping Use   Vaping status: Never Used  Substance Use Topics   Alcohol use: Yes    Alcohol/week: 1.0 - 2.0 standard drink of alcohol    Types: 1 - 2 Standard drinks or equivalent per week    Comment: wine 3 times a week   Drug use: No    Family Medical History: Family History  Problem Relation Age of Onset   Heart failure Mother    Heart attack Father    Diabetes Brother    Heart disease  Brother    Melanoma Brother    Cancer Maternal Uncle    Diabetes Paternal Aunt    Heart disease Paternal Aunt    Heart disease Paternal Grandmother    Diabetes Maternal Aunt    Heart attack Maternal Aunt    Diabetes Maternal Aunt    Diabetes Paternal Aunt    Heart disease Paternal Aunt    Breast cancer Neg Hx     Physical Examination: Today's Vitals   03/16/23 1026  BP: 122/68  Weight: 81 kg  Height: 4\' 11"  (1.499 m)  PainSc: 6   PainLoc: Back   Body mass index is 36.07 kg/m.   General: Patient is well developed, well nourished, calm, collected, and in no apparent distress. Attention to examination is appropriate.  Psychiatric: Patient is non-anxious.  Head:  Pupils equal, round, and reactive to light.  ENT:  Oral mucosa appears well hydrated.  Neck:   Supple.  Full range of motion.  Respiratory: Patient is breathing without any difficulty.  Extremities: No edema.  Vascular: Palpable dorsal pedal pulses.  Skin:   On exposed skin, there are no abnormal skin lesions.  NEUROLOGICAL:     Awake, alert, oriented to person, place, and time.  Speech is clear and fluent. Fund of  knowledge is appropriate.   Cranial Nerves: Pupils equal round and reactive to light.  Facial tone is symmetric.  Facial sensation is symmetric.  ROM of spine: limited flex/ex due to pain  Palpation of spine: non tender.    Strength: Side Biceps Triceps Deltoid Interossei Grip Wrist Ext. Wrist Flex.  R 5 5 5 5 5 5 5   L 5 5 5 5 5 5 5    Side Iliopsoas Quads Hamstring PF DF EHL  R 5 5 5 5 5 5   L 5 5 5 5 5 5    Reflexes are 1+ and symmetric at the biceps, triceps, brachioradialis, patella and achilles.   Hoffman's is absent.  Clonus is not present.  Toes are down-going.  Bilateral upper and lower extremity sensation is intact to light touch.    Ambulates with an antalgic gait  Medical Decision Making  Imaging: MRI 02/16/23 MRI L spine IMPRESSION: 1. Grade 1 anterolisthesis and severe bilateral facet arthropathy at L4-L5 resulting in severe spinal canal stenosis with cauda equina nerve root compression and severe left worse than right neural foraminal stenosis, progressed since 2019. 2. Grade 1 anterolisthesis and severe bilateral facet arthropathy at L5-S1 resulting in right worse than left subarticular zone narrowing with contact of the traversing S1 nerve roots, and mild right worse than left neural foraminal stenosis, progressed since 2019. 3. Grade 1 retrolisthesis of T12 on L1 with a shallow right subarticular/foraminal disc protrusion and moderate right worse than left facet arthropathy resulting in mild right subarticular zone narrowing and moderate right worse than left neural foraminal stenosis, similar to 2019. 4. Disc bulge with bilateral extraforaminal protrusions at L2-L3 resulting in mild right worse than left subarticular zone narrowing and moderate left and mild-to-moderate right neural foraminal stenosis, progressed since 2019. 5. Disc bulge eccentric to the left and moderate facet arthropathy at L3-L4 resulting in mild spinal canal and left worse than  right subarticular zone narrowing and moderate left neural foraminal stenosis, progressed since 2019. 6. Levocurvature centered at L2-L3. 7. 1.0 cm indeterminate left renal lesion was not definitely present on prior CT abdomen/pelvis from 2017. In the absence of more recent prior imaging of the abdomen, recommend further evaluation with renal ultrasound.  Electronically Signed   By: Lesia Hausen M.D.   On: 03/05/2023 12:42  MRI L spine 04/08/2018  FINDINGS: Segmentation:  Standard   Alignment: 6 mm anterolisthesis L4-5. 3 mm retrolisthesis T12-L1. 3 mm anterolisthesis L5-S1.   Vertebrae:  No worrisome osseous lesion.   Conus medullaris and cauda equina: Conus extends to the L1-L2 level. Conus and cauda equina appear normal.   Paraspinal and other soft tissues: Unremarkable.   Disc levels:   L1-L2: Disc desiccation. Fairly good preservation of disc height. Annular bulge. No impingement.   L2-L3: Disc desiccation. Annular bulge. Facet arthropathy. No impingement.   L3-L4: Mild loss of disc height and signal. Annular bulge. Shallow foraminal protrusion to the LEFT. Posterior element hypertrophy. No definite impingement.   L4-L5: Disc space narrowing. 6 mm anterolisthesis, facet mediated. Uncovering of the disc with central protrusion. Posterior element hypertrophy. Severe stenosis. BILATERAL L5 and L4 neural impingement.   L5-S1: 3 mm anterolisthesis, facet mediated. Preserved disc height. Annular bulge. Posterior element hypertrophy. No definite compressive subarticular zone or foraminal zone narrowing.   IMPRESSION: The dominant abnormality is at L4-5, where 6 mm anterolisthesis, central protrusion, and element hypertrophy result in severe stenosis with BILATERAL L4 and L5 neural impingement. Mild progression compared with 2016.     Electronically Signed   By: Elsie Stain M.D.   On: 04/08/2018 14:58  I have personally reviewed the images and agree with  the above interpretation.  Assessment and Plan: Kelsey Young is a pleasant 89 y.o. female with a longstanding history of lumbosacral complaints.  Her MRI shows significant L4-5 stenosis with corresponding spondylolisthesis.  I discussed this with the patient however she is not interested in any surgical interventions.  She does not want to go forward with injections as these were not helpful in the past.  We discussed adjunct treatment such as physical therapy and dry needling and she would like a referral for this.  I will place a referral to [facility]. Her MRI did show an incidental left renal mass and recommended follow-up with a renal ultrasound.  I will place an order for this and contact the patient with the results of the scan. Otherwise see her going forward on an as-needed basis.  She expressed understanding and was in agreement with this plan.  I spent a total of 56 minutes in both face-to-face and non-face-to-face activities for this visit on the date of this encounter including review of records, review of imaging, discussion of symptoms, discussion of medication management, discussion of treatment options, documentation, and order placement.  [facility]

## 2023-03-22 ENCOUNTER — Other Ambulatory Visit: Payer: PPO

## 2023-03-22 DIAGNOSIS — E538 Deficiency of other specified B group vitamins: Secondary | ICD-10-CM

## 2023-03-22 DIAGNOSIS — E782 Mixed hyperlipidemia: Secondary | ICD-10-CM

## 2023-03-22 DIAGNOSIS — E559 Vitamin D deficiency, unspecified: Secondary | ICD-10-CM

## 2023-03-22 DIAGNOSIS — I152 Hypertension secondary to endocrine disorders: Secondary | ICD-10-CM

## 2023-03-22 DIAGNOSIS — E1165 Type 2 diabetes mellitus with hyperglycemia: Secondary | ICD-10-CM

## 2023-03-22 DIAGNOSIS — E039 Hypothyroidism, unspecified: Secondary | ICD-10-CM

## 2023-03-23 ENCOUNTER — Ambulatory Visit: Payer: PPO | Admitting: Family

## 2023-03-23 ENCOUNTER — Other Ambulatory Visit: Payer: Self-pay

## 2023-03-23 ENCOUNTER — Encounter: Payer: Self-pay | Admitting: Family

## 2023-03-23 VITALS — BP 135/80 | HR 107 | Ht 59.0 in | Wt 178.4 lb

## 2023-03-23 DIAGNOSIS — R350 Frequency of micturition: Secondary | ICD-10-CM | POA: Diagnosis not present

## 2023-03-23 DIAGNOSIS — I10 Essential (primary) hypertension: Secondary | ICD-10-CM | POA: Diagnosis not present

## 2023-03-23 DIAGNOSIS — E039 Hypothyroidism, unspecified: Secondary | ICD-10-CM | POA: Diagnosis not present

## 2023-03-23 DIAGNOSIS — E1165 Type 2 diabetes mellitus with hyperglycemia: Secondary | ICD-10-CM | POA: Diagnosis not present

## 2023-03-23 DIAGNOSIS — E782 Mixed hyperlipidemia: Secondary | ICD-10-CM

## 2023-03-23 DIAGNOSIS — M51379 Other intervertebral disc degeneration, lumbosacral region without mention of lumbar back pain or lower extremity pain: Secondary | ICD-10-CM

## 2023-03-23 LAB — POCT URINALYSIS DIPSTICK
Bilirubin, UA: NEGATIVE
Blood, UA: NEGATIVE
Glucose, UA: POSITIVE — AB
Ketones, UA: NEGATIVE
Leukocytes, UA: NEGATIVE
Nitrite, UA: NEGATIVE
Protein, UA: NEGATIVE
Spec Grav, UA: 1.02 (ref 1.010–1.025)
Urobilinogen, UA: 0.2 E.U./dL
pH, UA: 6 (ref 5.0–8.0)

## 2023-03-23 LAB — POCT CBG (FASTING - GLUCOSE)-MANUAL ENTRY: Glucose Fasting, POC: 109 mg/dL — AB (ref 70–99)

## 2023-03-23 LAB — LIPID PANEL
Chol/HDL Ratio: 3.5 ratio (ref 0.0–4.4)
Cholesterol, Total: 148 mg/dL (ref 100–199)
HDL: 42 mg/dL (ref >39)
LDL Chol Calc (NIH): 85 mg/dL (ref 0–99)
Triglycerides: 114 mg/dL (ref 0–149)
VLDL Cholesterol Cal: 21 mg/dL (ref 5–40)

## 2023-03-23 LAB — CMP14+EGFR
ALT: 18 IU/L (ref 0–32)
AST: 21 IU/L (ref 0–40)
Albumin: 4.1 g/dL (ref 3.7–4.7)
Alkaline Phosphatase: 116 IU/L (ref 44–121)
BUN/Creatinine Ratio: 29 — ABNORMAL HIGH (ref 12–28)
BUN: 28 mg/dL — ABNORMAL HIGH (ref 8–27)
Bilirubin Total: 0.4 mg/dL (ref 0.0–1.2)
CO2: 22 mmol/L (ref 20–29)
Calcium: 10.1 mg/dL (ref 8.7–10.3)
Chloride: 101 mmol/L (ref 96–106)
Creatinine, Ser: 0.95 mg/dL (ref 0.57–1.00)
Globulin, Total: 2.6 g/dL (ref 1.5–4.5)
Glucose: 107 mg/dL — ABNORMAL HIGH (ref 70–99)
Potassium: 4.3 mmol/L (ref 3.5–5.2)
Sodium: 142 mmol/L (ref 134–144)
Total Protein: 6.7 g/dL (ref 6.0–8.5)
eGFR: 57 mL/min/{1.73_m2} — ABNORMAL LOW (ref >59)

## 2023-03-23 LAB — HEMOGLOBIN A1C
Est. average glucose Bld gHb Est-mCnc: 128 mg/dL
Hgb A1c MFr Bld: 6.1 % — ABNORMAL HIGH (ref 4.8–5.6)

## 2023-03-23 LAB — TSH: TSH: 1.63 u[IU]/mL (ref 0.450–4.500)

## 2023-03-23 LAB — VITAMIN D 25 HYDROXY (VIT D DEFICIENCY, FRACTURES): Vit D, 25-Hydroxy: 35.8 ng/mL (ref 30.0–100.0)

## 2023-03-23 LAB — VITAMIN B12: Vitamin B-12: 975 pg/mL (ref 232–1245)

## 2023-03-23 NOTE — Progress Notes (Signed)
Established Patient Office Visit  Subjective:  Patient ID: Kelsey Young, female    DOB: 09/27/1931  Age: 89 y.o. MRN: [id number]  Chief Complaint  Patient presents with   Follow-up    Pt had MRI done 8/20- back issues are still getting worse, not open to surgery.     Patient is here today for her 3 months follow up.  She has been feeling well since last appointment.   She does not have additional concerns to discuss today.  Labs were done yesterday, so we will discuss these in detail today. She needs refills.   I have reviewed her active problem list, medication list, allergies, notes from last encounter, lab results for her appointment today.      No other concerns at this time.   Past Medical History:  Diagnosis Date   Breast cancer (HCC)    Cancer (HCC) 03/2017   left breast   Chest pain, rule out acute myocardial infarction 11/23/2016   Complication of anesthesia    has been slow to wake up   COPD (chronic obstructive pulmonary disease) (HCC)    Diabetes mellitus without complication (HCC)    Dyspnea    Edema leg 03/2017   bilateral swelling   Family history of adverse reaction to anesthesia    son had surgery and heart stopped during procedure   GERD (gastroesophageal reflux disease)    Heart murmur    no treatment   Hemorrhoids 03/2017   History of kidney stones    had eswl   Hyperlipidemia    Hypertension    Hyponatremia 11/25/2016   Hypothyroidism    Lumbar stenosis    Nausea 08/23/2019   Palpitations 11/25/2016   Personal history of radiation therapy 2018   LEFT lumpectomy   Skin cancer 2016,2017,2018   face, back of neck, shoulder, leg, hand   Sleep apnea    does not and will not use a cpap   Thyroid disease     Past Surgical History:  Procedure Laterality Date   ABDOMINAL HYSTERECTOMY  1982   APPENDECTOMY  1940   BREAST BIOPSY Left 04/09/2017   [facility]   BREAST LUMPECTOMY Left 04/30/2017   [facility], clear margins, negative LN   BREAST  LUMPECTOMY WITH NEEDLE LOCALIZATION AND AXILLARY SENTINEL LYMPH NODE BX Left 04/30/2017   Procedure: LEFT BREAST LUMPECTOMY WITH NEEDLE LOCALIZATION AND LEFT AXILLARY SENTINEL LYMPH NODE BX;  Surgeon: Luretha Murphy, MD;  Location: [facility] ORS;  Service: General;  Laterality: Left;   CHOLECYSTECTOMY     EYE SURGERY Bilateral 2003   cataract extraction   implants  2003   dental, not removable   JOINT REPLACEMENT Bilateral 2003,2012   total hip replacements   JOINT REPLACEMENT Right 2014   right total knee replacement   SKIN CANCER EXCISION     left hand and right lower leg   THYROIDECTOMY, PARTIAL  2003   TONSILLECTOMY      Social History   Socioeconomic History   Marital status: Widowed    Spouse name: Not on file   Number of children: Not on file   Years of education: Not on file   Highest education level: Not on file  Occupational History   Not on file  Tobacco Use   Smoking status: Former    Current packs/day: 0.00    Types: Cigarettes    Quit date: 06/29/1985    Years since quitting: 37.8   Smokeless tobacco: Never  Vaping Use   Vaping  status: Never Used  Substance and Sexual Activity   Alcohol use: Yes    Alcohol/week: 1.0 - 2.0 standard drink of alcohol    Types: 1 - 2 Standard drinks or equivalent per week    Comment: wine 3 times a week   Drug use: No   Sexual activity: Never  Other Topics Concern   Not on file  Social History Narrative   Not on file   Social Determinants of Health   Financial Resource Strain: Not on file  Food Insecurity: Not on file  Transportation Needs: Not on file  Physical Activity: Not on file  Stress: Not on file  Social Connections: Not on file  Intimate Partner Violence: Not on file    Family History  Problem Relation Age of Onset   Heart failure Mother    Heart attack Father    Diabetes Brother    Heart disease Brother    Melanoma Brother    Cancer Maternal Uncle    Diabetes Paternal Aunt    Heart disease Paternal Aunt     Heart disease Paternal Grandmother    Diabetes Maternal Aunt    Heart attack Maternal Aunt    Diabetes Maternal Aunt    Diabetes Paternal Aunt    Heart disease Paternal Aunt    Breast cancer Neg Hx     Allergies  Allergen Reactions   Elemental Sulfur Hives and Shortness Of Breath   Iodinated Contrast Media Hives, Other (See Comments) and Shortness Of Breath    Restless. Difficulty breathing. Topical betadine okay  Restless  Restless  Restless. Difficulty breathing. Topical betadine okay    Restless   Codeine Nausea And Vomiting    With high doses, passes out   Morphine Nausea And Vomiting    With high doses, passes out   Oxycodone Nausea And Vomiting and Other (See Comments)    Dizzy and with high doses, passes out.   Penicillins Other (See Comments)    "Hardened lump" Has patient had a PCN reaction causing immediate rash, facial/tongue/throat swelling, SOB or lightheadedness with hypotension: No Has patient had a PCN reaction causing severe rash involving mucus membranes or skin necrosis: No Has patient had a PCN reaction that required hospitalization No Has patient had a PCN reaction occurring within the last 10 years: No If all of the above answers are "NO", then may proceed with Cephalosporin use.     Sulfa Antibiotics Rash    ROS     Objective:   BP 135/80   Pulse (!) 107   Ht 4\' 11"  (1.499 m)   Wt 178 lb 6.4 oz (80.9 kg)   SpO2 98%   BMI 36.03 kg/m   Vitals:   03/23/23 1307  BP: 135/80  Pulse: (!) 107  Height: 4\' 11"  (1.499 m)  Weight: 178 lb 6.4 oz (80.9 kg)  SpO2: 98%  BMI (Calculated): 36.01    Physical Exam   Results for orders placed or performed in visit on 03/23/23  POCT CBG (Fasting - Glucose)  Result Value Ref Range   Glucose Fasting, POC 109 (A) 70 - 99 mg/dL  POCT Urinalysis Dipstick (81002)  Result Value Ref Range   Color, UA Yellow    Clarity, UA Clear    Glucose, UA Positive (A) Negative   Bilirubin, UA Negative     Ketones, UA Negative    Spec Grav, UA 1.020 1.010 - 1.025   Blood, UA Negative    pH, UA 6.0 5.0 - 8.0  Protein, UA Negative Negative   Urobilinogen, UA 0.2 0.2 or 1.0 E.U./dL   Nitrite, UA Negative    Leukocytes, UA Negative Negative   Appearance Clear    Odor NO     Recent Results (from the past 2160 hour(s))  Vitamin B12     Status: None   Collection Time: 03/22/23  9:10 AM  Result Value Ref Range   Vitamin B-12 975 232 - 1,245 pg/mL  Hemoglobin A1c     Status: Abnormal   Collection Time: 03/22/23  9:10 AM  Result Value Ref Range   Hgb A1c MFr Bld 6.1 (H) 4.8 - 5.6 %    Comment:          Prediabetes: 5.7 - 6.4          Diabetes: >6.4          Glycemic control for adults with diabetes: <7.0    Est. average glucose Bld gHb Est-mCnc 128 mg/dL  TSH     Status: None   Collection Time: 03/22/23  9:10 AM  Result Value Ref Range   TSH 1.630 0.450 - 4.500 uIU/mL  CMP14+EGFR     Status: Abnormal   Collection Time: 03/22/23  9:10 AM  Result Value Ref Range   Glucose 107 (H) 70 - 99 mg/dL   BUN 28 (H) 8 - 27 mg/dL   Creatinine, Ser 4.09 0.57 - 1.00 mg/dL   eGFR 57 (L) >81 XB/JYN/8.29   BUN/Creatinine Ratio 29 (H) 12 - 28   Sodium 142 134 - 144 mmol/L   Potassium 4.3 3.5 - 5.2 mmol/L   Chloride 101 96 - 106 mmol/L   CO2 22 20 - 29 mmol/L   Calcium 10.1 8.7 - 10.3 mg/dL   Total Protein 6.7 6.0 - 8.5 g/dL   Albumin 4.1 3.7 - 4.7 g/dL   Globulin, Total 2.6 1.5 - 4.5 g/dL   Bilirubin Total 0.4 0.0 - 1.2 mg/dL   Alkaline Phosphatase 116 44 - 121 IU/L   AST 21 0 - 40 IU/L   ALT 18 0 - 32 IU/L  VITAMIN D 25 Hydroxy (Vit-D Deficiency, Fractures)     Status: None   Collection Time: 03/22/23  9:10 AM  Result Value Ref Range   Vit D, 25-Hydroxy 35.8 30.0 - 100.0 ng/mL    Comment: Vitamin D deficiency has been defined by the Institute of Medicine and an Endocrine Society practice guideline as a level of serum 25-OH vitamin D less than 20 ng/mL (1,2). The Endocrine Society went on  to further define vitamin D insufficiency as a level between 21 and 29 ng/mL (2). 1. IOM (Institute of Medicine). 2010. Dietary reference    intakes for calcium and D. [place]: The    [employer]. 2. Holick MF, Binkley Rye, Bischoff-Ferrari HA, et al.    Evaluation, treatment, and prevention of vitamin D    deficiency: an Endocrine Society clinical practice    guideline. JCEM. 2011 Jul; 96(7):1911-30.   Lipid panel     Status: None   Collection Time: 03/22/23  9:10 AM  Result Value Ref Range   Cholesterol, Total 148 100 - 199 mg/dL   Triglycerides 562 0 - 149 mg/dL   HDL 42 >13 mg/dL   VLDL Cholesterol Cal 21 5 - 40 mg/dL   LDL Chol Calc (NIH) 85 0 - 99 mg/dL   Chol/HDL Ratio 3.5 0.0 - 4.4 ratio    Comment:  T. Chol/HDL Ratio                                             Men  Women                               1/2 Avg.Risk  3.4    3.3                                   Avg.Risk  5.0    4.4                                2X Avg.Risk  9.6    7.1                                3X Avg.Risk 23.4   11.0   POCT CBG (Fasting - Glucose)     Status: Abnormal   Collection Time: 03/23/23  1:22 PM  Result Value Ref Range   Glucose Fasting, POC 109 (A) 70 - 99 mg/dL  POCT Urinalysis Dipstick (16109)     Status: Abnormal   Collection Time: 03/23/23  2:55 PM  Result Value Ref Range   Color, UA Yellow    Clarity, UA Clear    Glucose, UA Positive (A) Negative   Bilirubin, UA Negative    Ketones, UA Negative    Spec Grav, UA 1.020 1.010 - 1.025   Blood, UA Negative    pH, UA 6.0 5.0 - 8.0   Protein, UA Negative Negative   Urobilinogen, UA 0.2 0.2 or 1.0 E.U./dL   Nitrite, UA Negative    Leukocytes, UA Negative Negative   Appearance Clear    Odor NO        Assessment & Plan:   Problem List Items Addressed This Visit       Active Problems   DDD (degenerative disc disease), lumbosacral    Patient seeing NS.  Will defer to them for  changes.       Type 2 diabetes mellitus with hyperglycemia (HCC) - Primary    Continue current diabetes POC, as patient has been well controlled on current regimen.  Will adjust meds if needed based on labs.       Relevant Orders   POCT CBG (Fasting - Glucose) (Completed)   HLD (hyperlipidemia)    Continue current therapy for lipid control. Will modify as needed based on labwork results.        BP (high blood pressure)    Blood pressure well controlled with current medications.  Continue current therapy.  Will reassess at follow up.       Hypothyroidism    Patient stable.  Well controlled with current therapy.   Continue current meds.       Other Visit Diagnoses     Urination frequency       UA in office today WNL.  Continue current treatment.   Relevant Orders   POCT Urinalysis Dipstick (60454) (Completed)       Return in about 4 months (around 07/23/2023) for F/U.   Total time spent: 30 minutes  Levie Owensby M  [place], FNP  03/23/2023   This document may have been prepared by [facility] Voice Recognition software and as such may include unintentional dictation errors.

## 2023-04-07 ENCOUNTER — Ambulatory Visit
Admission: RE | Admit: 2023-04-07 | Discharge: 2023-04-07 | Disposition: A | Discharge status: Home / Self Care | Payer: PPO | Source: Ambulatory Visit | Attending: Neurosurgery | Admitting: Neurosurgery

## 2023-04-07 DIAGNOSIS — N2889 Other specified disorders of kidney and ureter: Secondary | ICD-10-CM | POA: Insufficient documentation

## 2023-04-23 ENCOUNTER — Encounter: Payer: Self-pay | Admitting: Family

## 2023-04-24 ENCOUNTER — Encounter: Payer: Self-pay | Admitting: Family

## 2023-04-24 NOTE — Assessment & Plan Note (Signed)
Patient stable.  Well controlled with current therapy.   Continue current meds.  

## 2023-04-24 NOTE — Assessment & Plan Note (Signed)
Blood pressure well controlled with current medications.  Continue current therapy.  Will reassess at follow up.  

## 2023-04-24 NOTE — Assessment & Plan Note (Signed)
Continue current diabetes POC, as patient has been well controlled on current regimen.  Will adjust meds if needed based on labs.  

## 2023-04-24 NOTE — Assessment & Plan Note (Signed)
Patient seeing NS.  Will defer to them for changes.

## 2023-04-24 NOTE — Assessment & Plan Note (Signed)
Continue current therapy for lipid control. Will modify as needed based on labwork results.   

## 2023-04-29 ENCOUNTER — Telehealth: Payer: Self-pay

## 2023-04-29 NOTE — Telephone Encounter (Signed)
-----   Message from Susanne Borders sent at 04/28/2023 10:18 AM EDT ----- If you have a change today will you let Ms. Wakley know that her renal US was reassuring regarding her cyst and that the radiologist did not recommend any further follow up imaging of it. Thank you ----- Message ----- From: Interface, Rad Results In Sent: 04/26/2023   2:07 PM EDT To: Susanne Borders, PA

## 2023-05-13 ENCOUNTER — Other Ambulatory Visit: Payer: Self-pay | Admitting: Family

## 2023-05-13 DIAGNOSIS — E1169 Type 2 diabetes mellitus with other specified complication: Secondary | ICD-10-CM

## 2023-05-17 ENCOUNTER — Ambulatory Visit: Payer: PPO

## 2023-05-18 ENCOUNTER — Ambulatory Visit
Admission: RE | Admit: 2023-05-18 | Discharge: 2023-05-18 | Disposition: A | Discharge status: Home / Self Care | Payer: PPO | Source: Ambulatory Visit | Attending: Oncology | Admitting: Oncology

## 2023-05-18 DIAGNOSIS — C50412 Malignant neoplasm of upper-outer quadrant of left female breast: Secondary | ICD-10-CM | POA: Insufficient documentation

## 2023-05-18 DIAGNOSIS — Z853 Personal history of malignant neoplasm of breast: Secondary | ICD-10-CM | POA: Insufficient documentation

## 2023-05-18 DIAGNOSIS — Z13820 Encounter for screening for osteoporosis: Secondary | ICD-10-CM | POA: Insufficient documentation

## 2023-05-18 DIAGNOSIS — Z79811 Long term (current) use of aromatase inhibitors: Secondary | ICD-10-CM | POA: Insufficient documentation

## 2023-05-18 DIAGNOSIS — Z1231 Encounter for screening mammogram for malignant neoplasm of breast: Secondary | ICD-10-CM | POA: Insufficient documentation

## 2023-05-18 DIAGNOSIS — Z923 Personal history of irradiation: Secondary | ICD-10-CM | POA: Insufficient documentation

## 2023-05-18 DIAGNOSIS — M810 Age-related osteoporosis without current pathological fracture: Secondary | ICD-10-CM | POA: Diagnosis not present

## 2023-05-18 DIAGNOSIS — Z780 Asymptomatic menopausal state: Secondary | ICD-10-CM | POA: Insufficient documentation

## 2023-05-20 ENCOUNTER — Other Ambulatory Visit: Payer: Self-pay | Admitting: Oncology

## 2023-05-20 ENCOUNTER — Inpatient Hospital Stay: Payer: PPO | Admitting: Oncology

## 2023-05-20 DIAGNOSIS — R928 Other abnormal and inconclusive findings on diagnostic imaging of breast: Secondary | ICD-10-CM

## 2023-05-21 ENCOUNTER — Telehealth: Payer: Self-pay | Admitting: Family

## 2023-05-21 ENCOUNTER — Encounter: Payer: Self-pay | Admitting: Family

## 2023-05-21 NOTE — Telephone Encounter (Signed)
Patient left VM requesting we send a week supply of atorvastatin to [facility] on [address] and a 90 DS to [facility]. Please send. She only has 2 pills left.

## 2023-05-22 MED ORDER — ATORVASTATIN CALCIUM 20 MG PO TABS: 20.0000 mg | ORAL_TABLET | Freq: Every day | ORAL | 0 refills | Status: DC

## 2023-05-22 MED ORDER — ATORVASTATIN CALCIUM 20 MG PO TABS: 20.0000 mg | ORAL_TABLET | Freq: Every day | ORAL | 3 refills | Status: DC

## 2023-05-25 ENCOUNTER — Telehealth: Payer: Self-pay

## 2023-05-25 ENCOUNTER — Other Ambulatory Visit: Payer: Self-pay

## 2023-05-25 MED ORDER — DILTIAZEM HCL ER COATED BEADS 300 MG PO CP24: 300.0000 mg | ORAL_CAPSULE | Freq: Every day | ORAL | 1 refills | Status: DC

## 2023-05-25 NOTE — Telephone Encounter (Signed)
Alena from [facility] for refill for Diltiazem 300 mg 24 hr capsule. Can e-scribe or fax [phone].

## 2023-05-31 ENCOUNTER — Encounter: Payer: Self-pay | Admitting: Oncology

## 2023-05-31 ENCOUNTER — Ambulatory Visit
Admission: RE | Admit: 2023-05-31 | Discharge: 2023-05-31 | Disposition: A | Discharge status: Home / Self Care | Payer: PPO | Source: Ambulatory Visit | Attending: Oncology | Admitting: Oncology

## 2023-05-31 ENCOUNTER — Inpatient Hospital Stay: Payer: PPO | Attending: Oncology | Admitting: Oncology

## 2023-05-31 VITALS — BP 164/77 | HR 105 | Temp 99.1°F | Resp 18 | Ht 59.0 in | Wt 173.6 lb

## 2023-05-31 DIAGNOSIS — Z87891 Personal history of nicotine dependence: Secondary | ICD-10-CM | POA: Insufficient documentation

## 2023-05-31 DIAGNOSIS — I10 Essential (primary) hypertension: Secondary | ICD-10-CM | POA: Insufficient documentation

## 2023-05-31 DIAGNOSIS — M549 Dorsalgia, unspecified: Secondary | ICD-10-CM | POA: Insufficient documentation

## 2023-05-31 DIAGNOSIS — Z853 Personal history of malignant neoplasm of breast: Secondary | ICD-10-CM | POA: Diagnosis present

## 2023-05-31 DIAGNOSIS — C50412 Malignant neoplasm of upper-outer quadrant of left female breast: Secondary | ICD-10-CM | POA: Diagnosis not present

## 2023-05-31 DIAGNOSIS — M810 Age-related osteoporosis without current pathological fracture: Secondary | ICD-10-CM | POA: Diagnosis not present

## 2023-05-31 DIAGNOSIS — R928 Other abnormal and inconclusive findings on diagnostic imaging of breast: Secondary | ICD-10-CM | POA: Insufficient documentation

## 2023-05-31 DIAGNOSIS — M2550 Pain in unspecified joint: Secondary | ICD-10-CM | POA: Insufficient documentation

## 2023-05-31 DIAGNOSIS — Z923 Personal history of irradiation: Secondary | ICD-10-CM | POA: Diagnosis not present

## 2023-05-31 DIAGNOSIS — G8929 Other chronic pain: Secondary | ICD-10-CM | POA: Diagnosis not present

## 2023-05-31 MED ORDER — ALENDRONATE SODIUM 70 MG PO TABS: 70.0000 mg | ORAL_TABLET | ORAL | 1 refills | Status: DC

## 2023-05-31 NOTE — Progress Notes (Signed)
[facility]  [phone] [phone]  ID: Kelsey Young OB: [birth year]  MR#: [id number]  WUX#:[id number]  Patient Care Team: Miki Kins, FNP as PCP - General (Family Medicine) Jeralyn Ruths, MD as Consulting Physician (Oncology)  CHIEF COMPLAINT: Pathologic stage Ia ER positive, PR/HER-2 negative invasive carcinoma of the upper outer quadrant of the left breast.  INTERVAL HISTORY: Patient returns to clinic today for routine 3-month evaluation.  She continues to complain of chronic pain, but otherwise feels well. She has no neurologic complaints.  She had a GI illness over the weekend, but now feels back to baseline.  She has a good appetite and denies weight loss.  She denies any chest pain, shortness of breath, cough, or hemoptysis.  She has no nausea, vomiting, constipation, or diarrhea.  She has no urinary complaints.  Patient offers no further specific complaints today.  REVIEW OF SYSTEMS:   Review of Systems  Constitutional: Negative.  Negative for fever, malaise/fatigue and weight loss.  HENT: Negative.  Negative for sore throat.   Respiratory:  Negative for cough and shortness of breath.   Cardiovascular: Negative.  Negative for chest pain and leg swelling.  Gastrointestinal: Negative.  Negative for abdominal pain, blood in stool, constipation, diarrhea, heartburn, melena, nausea and vomiting.  Genitourinary: Negative.  Negative for dysuria.  Musculoskeletal:  Positive for back pain and joint pain.  Skin: Negative.  Negative for rash.  Neurological: Negative.  Negative for dizziness, sensory change, focal weakness, weakness and headaches.  Psychiatric/Behavioral: Negative.  The patient is not nervous/anxious.     As per HPI. Otherwise, a complete review of systems is negative.  PAST MEDICAL HISTORY: Past Medical History:  Diagnosis Date   Breast cancer (HCC)    Cancer (HCC) 03/2017   left breast   Chest pain, rule out  acute myocardial infarction 11/23/2016   Complication of anesthesia    has been slow to wake up   COPD (chronic obstructive pulmonary disease) (HCC)    Diabetes mellitus without complication (HCC)    Dyspnea    Edema leg 03/2017   bilateral swelling   Family history of adverse reaction to anesthesia    son had surgery and heart stopped during procedure   GERD (gastroesophageal reflux disease)    Heart murmur    no treatment   Hemorrhoids 03/2017   History of kidney stones    had eswl   Hyperlipidemia    Hypertension    Hyponatremia 11/25/2016   Hypothyroidism    Lumbar stenosis    Nausea 08/23/2019   Palpitations 11/25/2016   Personal history of radiation therapy 2018   LEFT lumpectomy   Skin cancer 2016,2017,2018   face, back of neck, shoulder, leg, hand   Sleep apnea    does not and will not use a cpap   Thyroid disease     PAST SURGICAL HISTORY: Past Surgical History:  Procedure Laterality Date   ABDOMINAL HYSTERECTOMY  1982   APPENDECTOMY  1940   BREAST BIOPSY Left 04/09/2017   [facility]   BREAST LUMPECTOMY Left 04/30/2017   [facility], clear margins, negative LN   BREAST LUMPECTOMY WITH NEEDLE LOCALIZATION AND AXILLARY SENTINEL LYMPH NODE BX Left 04/30/2017   Procedure: LEFT BREAST LUMPECTOMY WITH NEEDLE LOCALIZATION AND LEFT AXILLARY SENTINEL LYMPH NODE BX;  Surgeon: Luretha Murphy, MD;  Location: [facility] ORS;  Service: General;  Laterality: Left;   CHOLECYSTECTOMY     EYE SURGERY Bilateral 2003   cataract extraction   implants  2003   dental, not removable   JOINT REPLACEMENT Bilateral 2003,2012   total hip replacements   JOINT REPLACEMENT Right 2014   right total knee replacement   SKIN CANCER EXCISION     left hand and right lower leg   THYROIDECTOMY, PARTIAL  2003   TONSILLECTOMY      FAMILY HISTORY: Family History  Problem Relation Age of Onset   Heart failure Mother    Heart attack Father    Diabetes Brother    Heart disease Brother    Melanoma Brother     Cancer Maternal Uncle    Diabetes Paternal Aunt    Heart disease Paternal Aunt    Heart disease Paternal Grandmother    Diabetes Maternal Aunt    Heart attack Maternal Aunt    Diabetes Maternal Aunt    Diabetes Paternal Aunt    Heart disease Paternal Aunt    Breast cancer Neg Hx     ADVANCED DIRECTIVES (Y/N):  N  HEALTH MAINTENANCE: Social History   Tobacco Use   Smoking status: Former    Current packs/day: 0.00    Types: Cigarettes    Quit date: 06/29/1985    Years since quitting: 37.9   Smokeless tobacco: Never  Vaping Use   Vaping status: Never Used  Substance Use Topics   Alcohol use: Yes    Alcohol/week: 1.0 - 2.0 standard drink of alcohol    Types: 1 - 2 Standard drinks or equivalent per week    Comment: wine 3 times a week   Drug use: No     Colonoscopy:  PAP:  Bone density:  Lipid panel:  Allergies  Allergen Reactions   Elemental Sulfur Hives and Shortness Of Breath   Iodinated Contrast Media Hives, Other (See Comments) and Shortness Of Breath    Restless. Difficulty breathing. Topical betadine okay  Restless  Restless  Restless. Difficulty breathing. Topical betadine okay    Restless   Codeine Nausea And Vomiting    With high doses, passes out   Morphine Nausea And Vomiting    With high doses, passes out   Oxycodone Nausea And Vomiting and Other (See Comments)    Dizzy and with high doses, passes out.   Penicillins Other (See Comments)    "Hardened lump" Has patient had a PCN reaction causing immediate rash, facial/tongue/throat swelling, SOB or lightheadedness with hypotension: No Has patient had a PCN reaction causing severe rash involving mucus membranes or skin necrosis: No Has patient had a PCN reaction that required hospitalization No Has patient had a PCN reaction occurring within the last 10 years: No If all of the above answers are "NO", then may proceed with Cephalosporin use.     Sulfa Antibiotics Rash    Current Outpatient  Medications  Medication Sig Dispense Refill   acetaminophen (TYLENOL) 500 MG tablet Take 500 mg by mouth every 8 (eight) hours as needed (takes only as needed).     aspirin EC 81 MG EC tablet Take 1 tablet (81 mg total) by mouth daily. 30 tablet 3   atorvastatin (LIPITOR) 20 MG tablet Take 1 tablet (20 mg total) by mouth daily. 90 tablet 3   Coenzyme Q10 (CO Q 10 PO) Take 100 mg by mouth daily.      Continuous Glucose Sensor (FREESTYLE LIBRE 2 SENSOR) MISC APPLY ONE SENSOR EVERY 2 WEEKS AND USE TO MONITOR BLOOD SUGARS FOUR TIMES DAILY BEFORE MEALS AND AT BEDTIME. 2 each 11   diltiazem (CARDIZEM CD) 300  MG 24 hr capsule Take 1 capsule (300 mg total) by mouth daily. 90 capsule 1   docusate sodium (COLACE) 100 MG capsule Take 200 mg by mouth 2 (two) times daily.     econazole nitrate 1 % cream Use 1 % CREAM     empagliflozin (JARDIANCE) 25 MG TABS tablet Take 1 tablet (25 mg total) by mouth daily before breakfast. 90 tablet 1   levothyroxine (SYNTHROID) 50 MCG tablet Take 1 tablet (50 mcg total) by mouth daily. 90 tablet 2   losartan (COZAAR) 50 MG tablet TAKE 1 TABLET BY MOUTH EVERY DAY 90 tablet 3   omeprazole (PRILOSEC) 20 MG capsule Take 20 mg by mouth daily as needed (for heartburn).      triamcinolone cream (KENALOG) 0.1 % Apply 1 Application topically 2 (two) times daily.     triamterene-hydrochlorothiazide (MAXZIDE-25) 37.5-25 MG tablet Take 0.5 tablets by mouth daily. 90 tablet 3   No current facility-administered medications for this visit.    OBJECTIVE: Vitals:   05/31/23 1420  BP: (!) 164/77  Pulse: (!) 105  Resp: 18  Temp: 99.1 F (37.3 C)  SpO2: 94%     Body mass index is 35.06 kg/m.    ECOG FS:1 - Symptomatic but completely ambulatory  General: Well-developed, well-nourished, no acute distress. Eyes: Pink conjunctiva, anicteric sclera. HEENT: Normocephalic, moist mucous membranes. Lungs: No audible wheezing or coughing. Heart: Regular rate and rhythm. Abdomen: Soft,  nontender, no obvious distention. Musculoskeletal: No edema, cyanosis, or clubbing. Neuro: Alert, answering all questions appropriately. Cranial nerves grossly intact. Skin: No rashes or petechiae noted. Psych: Normal affect.  LAB RESULTS:  Lab Results  Component Value Date   NA 142 03/22/2023   K 4.3 03/22/2023   CL 101 03/22/2023   CO2 22 03/22/2023   GLUCOSE 107 (H) 03/22/2023   BUN 28 (H) 03/22/2023   CREATININE 0.95 03/22/2023   CALCIUM 10.1 03/22/2023   PROT 6.7 03/22/2023   ALBUMIN 4.1 03/22/2023   AST 21 03/22/2023   ALT 18 03/22/2023   ALKPHOS 116 03/22/2023   BILITOT 0.4 03/22/2023   GFRNONAA >60 04/22/2017   GFRAA >60 04/22/2017    Lab Results  Component Value Date   WBC 6.2 01/20/2023   NEUTROABS 4.0 01/20/2023   HGB 15.0 01/20/2023   HCT 44.9 01/20/2023   MCV 92 01/20/2023   PLT 310 04/22/2017     STUDIES: MM 3D DIAGNOSTIC MAMMOGRAM UNILATERAL LEFT BREAST  Result Date: 05/31/2023 CLINICAL DATA:  LEFT asymmetry callback. History of a LEFT lumpectomy in 2018. EXAM: DIGITAL DIAGNOSTIC UNILATERAL LEFT MAMMOGRAM WITH TOMOSYNTHESIS AND CAD TECHNIQUE: Left digital diagnostic mammography and breast tomosynthesis was performed. The images were evaluated with computer-aided detection. Best images possible per technologist communication. COMPARISON:  Previous exam(s). ACR Breast Density Category b: There are scattered areas of fibroglandular density. FINDINGS: The previously described finding does not persist with additional views, consistent with superimposed fibroglandular tissue. There is density and architectural distortion within the LEFT breast, consistent with postsurgical changes. These are stable in comparison to prior. No suspicious mass, microcalcification, or other finding is identified. IMPRESSION: No mammographic evidence of malignancy. RECOMMENDATION: Per protocol, as the patient is now 2 or more years status post lumpectomy, she may return to annual  screening mammography in 1 year. However, given the history of breast cancer, the patient remains eligible for annual diagnostic mammography if preferred. (Code:[id number]-B-01Y) I have discussed the findings and recommendations with the patient. If applicable, a reminder letter will be sent  to the patient regarding the next appointment. BI-RADS CATEGORY  2: Benign. Electronically Signed   By: Meda Klinefelter M.D.   On: 05/31/2023 11:44   MM 3D SCREENING MAMMOGRAM BILATERAL BREAST  Result Date: 05/20/2023 CLINICAL DATA:  Screening. EXAM: DIGITAL SCREENING BILATERAL MAMMOGRAM WITH TOMOSYNTHESIS AND CAD TECHNIQUE: Bilateral screening digital craniocaudal and mediolateral oblique mammograms were obtained. Bilateral screening digital breast tomosynthesis was performed. The images were evaluated with computer-aided detection. COMPARISON:  Previous exam(s). ACR Breast Density Category b: There are scattered areas of fibroglandular density. FINDINGS: In the left breast, a possible asymmetry warrants further evaluation. In the right breast, no findings suspicious for malignancy. IMPRESSION: Further evaluation is suggested for possible asymmetry in the left breast. RECOMMENDATION: Diagnostic mammogram and possibly ultrasound of the left breast. (Code:[id number]-55M) The patient will be contacted regarding the findings, and additional imaging will be scheduled. BI-RADS CATEGORY  0: Incomplete: Need additional imaging evaluation. Electronically Signed   By: Frederico Hamman M.D.   On: 05/20/2023 14:55   DG Bone Density  Result Date: 05/18/2023 EXAM: DUAL X-RAY ABSORPTIOMETRY (DXA) FOR BONE MINERAL DENSITY IMPRESSION: Your patient Suzelle Sory completed a BMD test on 05/18/2023 using the [employer] iDXA DXA System (software version: 14.10) manufactured by [employer]. The following summarizes the results of our evaluation. Technologist: SCE PATIENT BIOGRAPHICAL: Name: Kerrah, Windholz Patient ID: [id number] Birth Date:  09-May-1932 Height: 58.5 in. Gender: Female Exam Date: 05/18/2023 Weight: 176.9 lbs. Indications: Advanced Age, Bilateral Hip Replacements, Caucasian, Diabetic, Early Menopause, High Risk Meds, History of Breast Cancer, History of Fracture (Adult), History of Radiation, Hypothyroid, Hysterectomy, left knee replacement, Postmenopausal Fractures: ankle, Right elbow Treatments: Jardiance, Levothyroxine, Omeprazole DENSITOMETRY RESULTS: Site         Region     Measured Date Measured Age WHO Classification Young Adult T-score BMD         %Change vs. Previous Significant Change (*) AP Spine L2-L3 05[age]18/[age] 89.1 Osteopenia -1.5 1.031 g/cm2 - - Left Forearm Radius 33% 05/18/2023 89.1 Osteoporosis -2.7 0.639 g/cm2 -5.2% - Left Forearm Radius 33% 12/02/2021 87.7 Osteopenia -2.3 0.674 g/cm2 4.7% - Left Forearm Radius 33% 12/02/2021 87.7 Osteoporosis -2.6 0.644 g/cm2 -9.9% Yes Left Forearm Radius 33% 08/13/2020 86.4 Osteopenia -1.8 0.715 g/cm2 -9.0% Yes Left Forearm Radius 33% 08/14/2019 85.4 Normal -1.0 0.786 g/cm2 -1.5% - Left Forearm Radius 33% 08/12/2017 83.4 Normal -0.9 0.798 g/cm2 - - ASSESSMENT: The BMD measured at Forearm Radius 33% is 0.639 g/cm2 with a T-score of -2.7. This patient is considered osteoporotic according to World Health Organization [facility]) criteria. The scan quality is good. L-1 and L-4 were excluded due to degenerative changes. Bilateral femurs were excluded due to surgical hardware. World Science writer [facility]) criteria for post-menopausal, Caucasian Women: Normal:                   T-score at or above -1 SD Osteopenia/low bone mass: T-score between -1 and -2.5 SD Osteoporosis:             T-score at or below -2.5 SD RECOMMENDATIONS: 1. All patients should optimize calcium and vitamin D intake. 2. Consider FDA-approved medical therapies in postmenopausal women and men aged 80 years and older, based on the following: a. A hip or vertebral(clinical or morphometric) fracture b. T-score < -2.5 at the  femoral neck or spine after appropriate evaluation to exclude secondary causes c. Low bone mass (T-score between -1.0 and -2.5 at the femoral neck or spine) and a 10-year probability of a  hip fracture > 3% or a 10-year probability of a major osteoporosis-related fracture > 20% based on the [place]-adapted WHO algorithm 3. Clinician judgment and/or patient preferences may indicate treatment for people with 10-year fracture probabilities above or below these levels FOLLOW-UP: People with diagnosed cases of osteoporosis or at high risk for fracture should have regular bone mineral density tests. For patients eligible for Medicare, routine testing is allowed once every 2 years. The testing frequency can be increased to one year for patients who have rapidly progressing disease, those who are receiving or discontinuing medical therapy to restore bone mass, or have additional risk factors. I have reviewed this report, and agree with the above findings. [facility] Electronically Signed   By: Romona Curls M.D.   On: 05/18/2023 13:38    ASSESSMENT: Pathologic stage Ia ER positive, PR/HER-2 negative invasive carcinoma of the upper outer quadrant of the left breast.  PLAN:    Pathologic stage Ia ER positive, PR/HER-2 negative invasive carcinoma of the upper outer quadrant of the left breast: Given the small size of patient's tumor and her advanced age, it was decided upon that chemotherapy was not necessary and Oncotype DX was not ordered.  Patient completed adjuvant XRT.  Patient discontinued letrozole secondary to side effects.  She completed 5 years of anastrozole in July 2024.  Her most recent mammogram on May 31, 2023 was reported as BI-RADS 2.  Repeat in 1 year.  Follow-up 2 to 3 days after her mammogram at which point patient likely can be discharged from clinic. Osteoporosis: Patient's most recent bone mineral density on May 18, 2023 was reported T-score of -2.7.  Patient was given a  prescription for Fosamax and instructed to continue calcium and vitamin D supplementation.  Will defer further management to her primary care physician.  Recommend repeating bone mineral density in November 2025. Back/joint pain: Chronic and unchanged. Hypertension: Chronic and unchanged.  Patient's blood pressure is moderately elevated today.  Continue monitoring and treatment per primary care.  Patient expressed understanding and was in agreement with this plan. She also understands that She can call clinic at any time with any questions, concerns, or complaints.    Cancer Staging  Primary cancer of upper outer quadrant of left female breast [facility]) Staging form: Breast, AJCC 8th Edition - Clinical stage from 04/17/2017: Stage IA (cT1a, cN0, cM0, G2, ER+, PR-, HER2-) - Signed by Jeralyn Ruths, MD on 04/17/2017 Histologic grading system: 3 grade system - Pathologic stage from 05/11/2017: Stage IA (pT1b, pN0, cM0, G2, ER+, PR-, HER2-) - Signed by Jeralyn Ruths, MD on 05/11/2017 Neoadjuvant therapy: No Histologic grading system: 3 grade system Laterality: Left   Jeralyn Ruths, MD   05/31/2023 2:38 PM

## 2023-05-31 NOTE — Addendum Note (Signed)
Addended by: Luz Lex on: 05/31/2023 03:07 PM   Modules accepted: Orders

## 2023-06-03 ENCOUNTER — Other Ambulatory Visit: Payer: Self-pay

## 2023-06-03 MED ORDER — LEVOTHYROXINE SODIUM 50 MCG PO TABS: 50.0000 ug | ORAL_TABLET | Freq: Every day | ORAL | 2 refills | Status: DC

## 2023-07-08 DIAGNOSIS — I350 Nonrheumatic aortic (valve) stenosis: Secondary | ICD-10-CM | POA: Diagnosis not present

## 2023-07-08 DIAGNOSIS — I10 Essential (primary) hypertension: Secondary | ICD-10-CM | POA: Diagnosis not present

## 2023-07-08 DIAGNOSIS — J449 Chronic obstructive pulmonary disease, unspecified: Secondary | ICD-10-CM | POA: Diagnosis not present

## 2023-07-08 DIAGNOSIS — R609 Edema, unspecified: Secondary | ICD-10-CM | POA: Diagnosis not present

## 2023-07-08 DIAGNOSIS — I351 Nonrheumatic aortic (valve) insufficiency: Secondary | ICD-10-CM | POA: Diagnosis not present

## 2023-07-08 DIAGNOSIS — K219 Gastro-esophageal reflux disease without esophagitis: Secondary | ICD-10-CM | POA: Diagnosis not present

## 2023-07-08 DIAGNOSIS — G4730 Sleep apnea, unspecified: Secondary | ICD-10-CM | POA: Diagnosis not present

## 2023-07-08 DIAGNOSIS — R0602 Shortness of breath: Secondary | ICD-10-CM | POA: Diagnosis not present

## 2023-07-08 DIAGNOSIS — R011 Cardiac murmur, unspecified: Secondary | ICD-10-CM | POA: Diagnosis not present

## 2023-07-08 DIAGNOSIS — I2089 Other forms of angina pectoris: Secondary | ICD-10-CM | POA: Diagnosis not present

## 2023-07-08 DIAGNOSIS — E785 Hyperlipidemia, unspecified: Secondary | ICD-10-CM | POA: Diagnosis not present

## 2023-07-08 DIAGNOSIS — E119 Type 2 diabetes mellitus without complications: Secondary | ICD-10-CM | POA: Diagnosis not present

## 2023-07-23 ENCOUNTER — Ambulatory Visit: Payer: PPO | Admitting: Family

## 2023-07-26 ENCOUNTER — Other Ambulatory Visit: Payer: PPO

## 2023-07-26 DIAGNOSIS — E538 Deficiency of other specified B group vitamins: Secondary | ICD-10-CM

## 2023-07-26 DIAGNOSIS — E782 Mixed hyperlipidemia: Secondary | ICD-10-CM

## 2023-07-26 DIAGNOSIS — E1165 Type 2 diabetes mellitus with hyperglycemia: Secondary | ICD-10-CM | POA: Diagnosis not present

## 2023-07-26 DIAGNOSIS — I152 Hypertension secondary to endocrine disorders: Secondary | ICD-10-CM

## 2023-07-26 DIAGNOSIS — E039 Hypothyroidism, unspecified: Secondary | ICD-10-CM | POA: Diagnosis not present

## 2023-07-26 DIAGNOSIS — E559 Vitamin D deficiency, unspecified: Secondary | ICD-10-CM

## 2023-07-26 DIAGNOSIS — R5383 Other fatigue: Secondary | ICD-10-CM | POA: Diagnosis not present

## 2023-07-27 ENCOUNTER — Encounter: Payer: Self-pay | Admitting: Family

## 2023-07-27 ENCOUNTER — Ambulatory Visit: Payer: PPO | Admitting: Family

## 2023-07-27 VITALS — BP 124/74 | HR 99 | Ht 59.0 in | Wt 175.0 lb

## 2023-07-27 DIAGNOSIS — E782 Mixed hyperlipidemia: Secondary | ICD-10-CM | POA: Diagnosis not present

## 2023-07-27 DIAGNOSIS — M51379 Other intervertebral disc degeneration, lumbosacral region without mention of lumbar back pain or lower extremity pain: Secondary | ICD-10-CM

## 2023-07-27 DIAGNOSIS — R300 Dysuria: Secondary | ICD-10-CM

## 2023-07-27 DIAGNOSIS — E1165 Type 2 diabetes mellitus with hyperglycemia: Secondary | ICD-10-CM

## 2023-07-27 DIAGNOSIS — Z1389 Encounter for screening for other disorder: Secondary | ICD-10-CM | POA: Diagnosis not present

## 2023-07-27 DIAGNOSIS — I10 Essential (primary) hypertension: Secondary | ICD-10-CM | POA: Diagnosis not present

## 2023-07-27 LAB — LIPID PANEL
Chol/HDL Ratio: 3.6 {ratio} (ref 0.0–4.4)
Cholesterol, Total: 155 mg/dL (ref 100–199)
HDL: 43 mg/dL (ref >39)
LDL Chol Calc (NIH): 93 mg/dL (ref 0–99)
Triglycerides: 100 mg/dL (ref 0–149)
VLDL Cholesterol Cal: 19 mg/dL (ref 5–40)

## 2023-07-27 LAB — VITAMIN B12: Vitamin B-12: 1203 pg/mL (ref 232–1245)

## 2023-07-27 LAB — POCT URINALYSIS DIPSTICK
Bilirubin, UA: NEGATIVE
Blood, UA: NEGATIVE
Glucose, UA: POSITIVE — AB
Ketones, UA: NEGATIVE
Leukocytes, UA: NEGATIVE
Nitrite, UA: NEGATIVE
Protein, UA: NEGATIVE
Spec Grav, UA: 1.015
Urobilinogen, UA: 0.2 U/dL
pH, UA: 6.5

## 2023-07-27 LAB — CMP14+EGFR
ALT: 19 [IU]/L (ref 0–32)
AST: 21 [IU]/L (ref 0–40)
Albumin: 4 g/dL (ref 3.7–4.7)
Alkaline Phosphatase: 120 [IU]/L (ref 44–121)
BUN/Creatinine Ratio: 27 (ref 12–28)
BUN: 23 mg/dL (ref 8–27)
Bilirubin Total: 0.4 mg/dL (ref 0.0–1.2)
CO2: 25 mmol/L (ref 20–29)
Calcium: 9.5 mg/dL (ref 8.7–10.3)
Chloride: 101 mmol/L (ref 96–106)
Creatinine, Ser: 0.84 mg/dL (ref 0.57–1.00)
Globulin, Total: 2.5 g/dL (ref 1.5–4.5)
Glucose: 105 mg/dL — ABNORMAL HIGH (ref 70–99)
Potassium: 3.8 mmol/L (ref 3.5–5.2)
Sodium: 140 mmol/L (ref 134–144)
Total Protein: 6.5 g/dL (ref 6.0–8.5)
eGFR: 66 mL/min/{1.73_m2} (ref >59)

## 2023-07-27 LAB — TSH: TSH: 1.86 u[IU]/mL (ref 0.450–4.500)

## 2023-07-27 LAB — HEMOGLOBIN A1C
Est. average glucose Bld gHb Est-mCnc: 123 mg/dL
Hgb A1c MFr Bld: 5.9 % — ABNORMAL HIGH (ref 4.8–5.6)

## 2023-07-27 LAB — VITAMIN D 25 HYDROXY (VIT D DEFICIENCY, FRACTURES): Vit D, 25-Hydroxy: 31.6 ng/mL (ref 30.0–100.0)

## 2023-07-27 MED ORDER — BREZTRI AEROSPHERE 160-9-4.8 MCG/ACT IN AERO: 2.0000 | INHALATION_SPRAY | Freq: Two times a day (BID) | RESPIRATORY_TRACT | 1 refills | Status: DC

## 2023-07-27 MED ORDER — FREESTYLE LIBRE 3 SENSOR MISC: 1.0000 | 3 refills | Status: DC

## 2023-07-27 MED ORDER — FREESTYLE LIBRE 3 READER DEVI: 1.0000 | 0 refills | Status: DC | PRN

## 2023-07-27 NOTE — Progress Notes (Signed)
Established Patient Office Visit  Subjective:  Patient ID: Kelsey Young, female    DOB: 07-Jun-1932  Age: 89 y.o. MRN: [id number]  Chief Complaint  Patient presents with   Follow-up    4 month follow up    Patient is here today for her 4 months follow up.  She has been feeling fairly well since last appointment.   She does have additional concerns to discuss today.  Ache between right below her waist and down her back, feels short of breath.  Says that she isn't sure if this is because of the issues with her back, but either way she can't get a full breath.   Labs are due today. She needs refills.   I have reviewed her active problem list, medication list, allergies, family history, notes from last encounter, lab results for her appointment today.      No other concerns at this time.   Past Medical History:  Diagnosis Date   Breast cancer (HCC)    Cancer (HCC) 03/2017   left breast   Chest pain, rule out acute myocardial infarction 11/23/2016   Complication of anesthesia    has been slow to wake up   COPD (chronic obstructive pulmonary disease) (HCC)    Diabetes mellitus without complication (HCC)    Dyspnea    Edema leg 03/2017   bilateral swelling   Family history of adverse reaction to anesthesia    son had surgery and heart stopped during procedure   GERD (gastroesophageal reflux disease)    Heart murmur    no treatment   Hemorrhoids 03/2017   History of kidney stones    had eswl   Hyperlipidemia    Hypertension    Hyponatremia 11/25/2016   Hypothyroidism    Lumbar stenosis    Nausea 08/23/2019   Palpitations 11/25/2016   Personal history of radiation therapy 2018   LEFT lumpectomy   Skin cancer 2016,2017,2018   face, back of neck, shoulder, leg, hand   Sleep apnea    does not and will not use a cpap   Thyroid disease     Past Surgical History:  Procedure Laterality Date   ABDOMINAL HYSTERECTOMY  1982   APPENDECTOMY  1940   BREAST BIOPSY  Left 04/09/2017   [facility]   BREAST LUMPECTOMY Left 04/30/2017   [facility], clear margins, negative LN   BREAST LUMPECTOMY WITH NEEDLE LOCALIZATION AND AXILLARY SENTINEL LYMPH NODE BX Left 04/30/2017   Procedure: LEFT BREAST LUMPECTOMY WITH NEEDLE LOCALIZATION AND LEFT AXILLARY SENTINEL LYMPH NODE BX;  Surgeon: Luretha Murphy, MD;  Location: [facility] ORS;  Service: General;  Laterality: Left;   CHOLECYSTECTOMY     EYE SURGERY Bilateral 2003   cataract extraction   implants  2003   dental, not removable   JOINT REPLACEMENT Bilateral 2003,2012   total hip replacements   JOINT REPLACEMENT Right 2014   right total knee replacement   SKIN CANCER EXCISION     left hand and right lower leg   THYROIDECTOMY, PARTIAL  2003   TONSILLECTOMY      Social History   Socioeconomic History   Marital status: Widowed    Spouse name: Not on file   Number of children: Not on file   Years of education: Not on file   Highest education level: Not on file  Occupational History   Not on file  Tobacco Use   Smoking status: Former    Current packs/day: 0.00    Types: Cigarettes  Quit date: 06/29/1985    Years since quitting: 38.1   Smokeless tobacco: Never  Vaping Use   Vaping status: Never Used  Substance and Sexual Activity   Alcohol use: Yes    Alcohol/week: 1.0 - 2.0 standard drink of alcohol    Types: 1 - 2 Standard drinks or equivalent per week    Comment: wine 3 times a week   Drug use: No   Sexual activity: Never  Other Topics Concern   Not on file  Social History Narrative   Not on file   Social Drivers of Health   Financial Resource Strain: Not on file  Food Insecurity: Not on file  Transportation Needs: Not on file  Physical Activity: Not on file  Stress: Not on file  Social Connections: Not on file  Intimate Partner Violence: Not on file    Family History  Problem Relation Age of Onset   Heart failure Mother    Heart attack Father    Diabetes Brother    Heart disease Brother     Melanoma Brother    Cancer Maternal Uncle    Diabetes Paternal Aunt    Heart disease Paternal Aunt    Heart disease Paternal Grandmother    Diabetes Maternal Aunt    Heart attack Maternal Aunt    Diabetes Maternal Aunt    Diabetes Paternal Aunt    Heart disease Paternal Aunt    Breast cancer Neg Hx     Allergies  Allergen Reactions   Elemental Sulfur Hives and Shortness Of Breath   Iodinated Contrast Media Hives, Other (See Comments) and Shortness Of Breath    Restless. Difficulty breathing. Topical betadine okay  Restless  Restless  Restless. Difficulty breathing. Topical betadine okay    Restless   Codeine Nausea And Vomiting    With high doses, passes out   Morphine Nausea And Vomiting    With high doses, passes out   Oxycodone Nausea And Vomiting and Other (See Comments)    Dizzy and with high doses, passes out.   Penicillins Other (See Comments)    "Hardened lump" Has patient had a PCN reaction causing immediate rash, facial/tongue/throat swelling, SOB or lightheadedness with hypotension: No Has patient had a PCN reaction causing severe rash involving mucus membranes or skin necrosis: No Has patient had a PCN reaction that required hospitalization No Has patient had a PCN reaction occurring within the last 10 years: No If all of the above answers are "NO", then may proceed with Cephalosporin use.     Sulfa Antibiotics Rash    Review of Systems  All other systems reviewed and are negative.      Objective:   BP 124/74   Pulse 99   Ht 4\' 11"  (1.499 m)   Wt 175 lb (79.4 kg)   SpO2 96%   BMI 35.35 kg/m   Vitals:   07/27/23 1051  BP: 124/74  Pulse: 99  Height: 4\' 11"  (1.499 m)  Weight: 175 lb (79.4 kg)  SpO2: 96%  BMI (Calculated): 35.33    Physical Exam Vitals and nursing note reviewed.  Constitutional:      Appearance: Normal appearance. She is normal weight.  HENT:     Head: Normocephalic.  Eyes:     Extraocular Movements: Extraocular  movements intact.     Conjunctiva/sclera: Conjunctivae normal.     Pupils: Pupils are equal, round, and reactive to light.  Cardiovascular:     Rate and Rhythm: Normal rate.  Pulmonary:  Effort: Pulmonary effort is normal.  Neurological:     General: No focal deficit present.     Mental Status: She is alert and oriented to person, place, and time. Mental status is at baseline.  Psychiatric:        Mood and Affect: Mood normal.        Behavior: Behavior normal.        Thought Content: Thought content normal.        Judgment: Judgment normal.      Results for orders placed or performed in visit on 07/27/23  POCT Urinalysis Dipstick (81002)  Result Value Ref Range   Color, UA yellow    Clarity, UA clear    Glucose, UA Positive (A) Negative   Bilirubin, UA neg    Ketones, UA neg    Spec Grav, UA 1.015 1.010 - 1.025   Blood, UA neg    pH, UA 6.5 5.0 - 8.0   Protein, UA Negative Negative   Urobilinogen, UA 0.2 0.2 or 1.0 E.U./dL   Nitrite, UA neg    Leukocytes, UA Negative Negative   Appearance clear    Odor yes     Recent Results (from the past 2160 hours)  Vitamin B12     Status: None   Collection Time: 07/26/23  9:30 AM  Result Value Ref Range   Vitamin B-12 1,203 232 - 1,245 pg/mL  Hemoglobin A1c     Status: Abnormal   Collection Time: 07/26/23  9:30 AM  Result Value Ref Range   Hgb A1c MFr Bld 5.9 (H) 4.8 - 5.6 %    Comment:          Prediabetes: 5.7 - 6.4          Diabetes: >6.4          Glycemic control for adults with diabetes: <7.0    Est. average glucose Bld gHb Est-mCnc 123 mg/dL  TSH     Status: None   Collection Time: 07/26/23  9:30 AM  Result Value Ref Range   TSH 1.860 0.450 - 4.500 uIU/mL  CMP14+EGFR     Status: Abnormal   Collection Time: 07/26/23  9:30 AM  Result Value Ref Range   Glucose 105 (H) 70 - 99 mg/dL   BUN 23 8 - 27 mg/dL   Creatinine, Ser 2.44 0.57 - 1.00 mg/dL   eGFR 66 >01 UU/VOZ/3.66   BUN/Creatinine Ratio 27 12 - 28    Sodium 140 134 - 144 mmol/L   Potassium 3.8 3.5 - 5.2 mmol/L   Chloride 101 96 - 106 mmol/L   CO2 25 20 - 29 mmol/L   Calcium 9.5 8.7 - 10.3 mg/dL   Total Protein 6.5 6.0 - 8.5 g/dL   Albumin 4.0 3.7 - 4.7 g/dL   Globulin, Total 2.5 1.5 - 4.5 g/dL   Bilirubin Total 0.4 0.0 - 1.2 mg/dL   Alkaline Phosphatase 120 44 - 121 IU/L   AST 21 0 - 40 IU/L   ALT 19 0 - 32 IU/L  VITAMIN D 25 Hydroxy (Vit-D Deficiency, Fractures)     Status: None   Collection Time: 07/26/23  9:30 AM  Result Value Ref Range   Vit D, 25-Hydroxy 31.6 30.0 - 100.0 ng/mL    Comment: Vitamin D deficiency has been defined by the Institute of Medicine and an Endocrine Society practice guideline as a level of serum 25-OH vitamin D less than 20 ng/mL (1,2). The Endocrine Society went on to further define vitamin  D insufficiency as a level between 21 and 29 ng/mL (2). 1. IOM (Institute of Medicine). 2010. Dietary reference    intakes for calcium and D. [place]: The    [employer]. 2. Holick MF, Binkley [place], Bischoff-Ferrari HA, et al.    Evaluation, treatment, and prevention of vitamin D    deficiency: an Endocrine Society clinical practice    guideline. JCEM. 2011 Jul; 96(7):1911-30.   Lipid panel     Status: None   Collection Time: 07/26/23  9:30 AM  Result Value Ref Range   Cholesterol, Total 155 100 - 199 mg/dL   Triglycerides 098 0 - 149 mg/dL   HDL 43 >11 mg/dL   VLDL Cholesterol Cal 19 5 - 40 mg/dL   LDL Chol Calc (NIH) 93 0 - 99 mg/dL   Chol/HDL Ratio 3.6 0.0 - 4.4 ratio    Comment:                                   T. Chol/HDL Ratio                                             Men  Women                               1/2 Avg.Risk  3.4    3.3                                   Avg.Risk  5.0    4.4                                2X Avg.Risk  9.6    7.1                                3X Avg.Risk 23.4   11.0   Iron and TIBC     Status: None   Collection Time: 07/26/23  9:30 AM  Result  Value Ref Range   Total Iron Binding Capacity 277 250 - 450 ug/dL   UIBC 914 782 - 956 ug/dL   Iron 90 27 - 213 ug/dL   Iron Saturation 32 15 - 55 %  Specimen status report     Status: None   Collection Time: 07/26/23  9:30 AM  Result Value Ref Range   specimen status report Comment     Comment: Written Authorization Written Authorization Written Authorization Received. Authorization received from [employer]  for [employer] on 07-27-2023 Logged by Adria Dill   POCT Urinalysis Dipstick [id number])     Status: Abnormal   Collection Time: 07/27/23 11:27 AM  Result Value Ref Range   Color, UA yellow    Clarity, UA clear    Glucose, UA Positive (A) Negative   Bilirubin, UA neg    Ketones, UA neg    Spec Grav, UA 1.015 1.010 - 1.025   Blood, UA neg    pH, UA 6.5 5.0 - 8.0   Protein, UA Negative Negative   Urobilinogen, UA 0.2 0.2 or 1.0  E.U./dL   Nitrite, UA neg    Leukocytes, UA Negative Negative   Appearance clear    Odor yes        Assessment & Plan:   Problem List Items Addressed This Visit       Cardiovascular and Mediastinum   BP (high blood pressure)   Blood pressure well controlled with current medications.  Continue current therapy.  Will reassess at follow up.        Relevant Medications   furosemide (LASIX) 20 MG tablet   isosorbide mononitrate (IMDUR) 60 MG 24 hr tablet   metoprolol succinate (TOPROL-XL) 50 MG 24 hr tablet     Endocrine   Type 2 diabetes mellitus with hyperglycemia (HCC)   Checking labs today. Will call pt. With results  Continue current diabetes POC, as patient has been well controlled on current regimen.  Will adjust meds if needed based on labs.        Relevant Medications   Continuous Glucose Receiver (FREESTYLE LIBRE 3 READER) DEVI   Continuous Glucose Sensor (FREESTYLE LIBRE 3 SENSOR) MISC     Musculoskeletal and Integument   DDD (degenerative disc disease), lumbosacral - Primary     Other   HLD (hyperlipidemia)    Checking labs today.  Continue current therapy for lipid control. Will modify as needed based on labwork results.        Relevant Medications   furosemide (LASIX) 20 MG tablet   isosorbide mononitrate (IMDUR) 60 MG 24 hr tablet   metoprolol succinate (TOPROL-XL) 50 MG 24 hr tablet   Other Visit Diagnoses       Screening for blood or protein in urine       Relevant Orders   POCT Urinalysis Dipstick (16109) (Completed)     Dysuria       Urine sample normal.  Sending for UA/UC.   Relevant Orders   Urinalysis, Routine w reflex microscopic   Urine Culture      Giving patient samples of breztri.  She will let me know if these help with her shortness of breath.   Return in about 3 months (around 10/25/2023).   Total time spent: 20 minutes  Miki Kins, FNP  07/27/2023   This document may have been prepared by [facility] Voice Recognition software and as such may include unintentional dictation errors.

## 2023-07-28 LAB — SPECIMEN STATUS REPORT

## 2023-07-28 LAB — IRON AND TIBC
Iron Saturation: 32 % (ref 15–55)
Iron: 90 ug/dL (ref 27–139)
Total Iron Binding Capacity: 277 ug/dL (ref 250–450)
UIBC: 187 ug/dL (ref 118–369)

## 2023-07-29 DIAGNOSIS — C44729 Squamous cell carcinoma of skin of left lower limb, including hip: Secondary | ICD-10-CM | POA: Diagnosis not present

## 2023-07-31 ENCOUNTER — Encounter: Payer: Self-pay | Admitting: Family

## 2023-07-31 NOTE — Assessment & Plan Note (Signed)
 Checking labs today. Will call pt. With results  Continue current diabetes POC, as patient has been well controlled on current regimen.  Will adjust meds if needed based on labs.

## 2023-07-31 NOTE — Assessment & Plan Note (Signed)
 Checking labs today.  Continue current therapy for lipid control. Will modify as needed based on labwork results.

## 2023-07-31 NOTE — Assessment & Plan Note (Signed)
.  amssatble

## 2023-07-31 NOTE — Assessment & Plan Note (Signed)
 Blood pressure well controlled with current medications.  Continue current therapy.  Will reassess at follow up.

## 2023-08-02 ENCOUNTER — Other Ambulatory Visit: Payer: Self-pay | Admitting: Family

## 2023-08-03 MED ORDER — EMPAGLIFLOZIN 25 MG PO TABS: 25.0000 mg | ORAL_TABLET | Freq: Every day | ORAL | 1 refills | Status: DC

## 2023-08-29 ENCOUNTER — Other Ambulatory Visit: Payer: Self-pay | Admitting: Family

## 2023-08-29 DIAGNOSIS — E1165 Type 2 diabetes mellitus with hyperglycemia: Secondary | ICD-10-CM

## 2023-10-12 ENCOUNTER — Telehealth: Payer: Self-pay

## 2023-10-12 DIAGNOSIS — E119 Type 2 diabetes mellitus without complications: Secondary | ICD-10-CM | POA: Diagnosis not present

## 2023-10-12 DIAGNOSIS — J449 Chronic obstructive pulmonary disease, unspecified: Secondary | ICD-10-CM | POA: Diagnosis not present

## 2023-10-12 DIAGNOSIS — R609 Edema, unspecified: Secondary | ICD-10-CM | POA: Diagnosis not present

## 2023-10-12 DIAGNOSIS — I351 Nonrheumatic aortic (valve) insufficiency: Secondary | ICD-10-CM | POA: Diagnosis not present

## 2023-10-12 DIAGNOSIS — K219 Gastro-esophageal reflux disease without esophagitis: Secondary | ICD-10-CM | POA: Diagnosis not present

## 2023-10-12 DIAGNOSIS — I10 Essential (primary) hypertension: Secondary | ICD-10-CM | POA: Diagnosis not present

## 2023-10-12 DIAGNOSIS — I2089 Other forms of angina pectoris: Secondary | ICD-10-CM | POA: Diagnosis not present

## 2023-10-12 DIAGNOSIS — R011 Cardiac murmur, unspecified: Secondary | ICD-10-CM | POA: Diagnosis not present

## 2023-10-12 DIAGNOSIS — G4730 Sleep apnea, unspecified: Secondary | ICD-10-CM | POA: Diagnosis not present

## 2023-10-12 DIAGNOSIS — I350 Nonrheumatic aortic (valve) stenosis: Secondary | ICD-10-CM | POA: Diagnosis not present

## 2023-10-12 DIAGNOSIS — R0602 Shortness of breath: Secondary | ICD-10-CM | POA: Diagnosis not present

## 2023-10-12 DIAGNOSIS — E785 Hyperlipidemia, unspecified: Secondary | ICD-10-CM | POA: Diagnosis not present

## 2023-10-12 NOTE — Telephone Encounter (Signed)
 Entered in error

## 2023-10-13 ENCOUNTER — Other Ambulatory Visit: Payer: Self-pay

## 2023-10-13 DIAGNOSIS — E119 Type 2 diabetes mellitus without complications: Secondary | ICD-10-CM | POA: Diagnosis not present

## 2023-10-13 MED ORDER — ONETOUCH VERIO VI STRP: ORAL_STRIP | 1 refills | Status: AC

## 2023-10-13 MED ORDER — ONETOUCH VERIO W/DEVICE KIT: PACK | 0 refills | Status: AC

## 2023-10-18 ENCOUNTER — Telehealth: Payer: Self-pay | Admitting: Family

## 2023-10-18 NOTE — Telephone Encounter (Signed)
 Patient left VM that she needs Rx sent for [facility]. Please advise.

## 2023-10-19 ENCOUNTER — Other Ambulatory Visit: Payer: Self-pay

## 2023-10-19 MED ORDER — FREESTYLE LIBRE 3 PLUS SENSOR MISC: 11 refills | Status: AC

## 2023-10-19 NOTE — Telephone Encounter (Signed)
 Kelsey Young libre 3+ sensor sent

## 2023-10-25 ENCOUNTER — Ambulatory Visit: Payer: PPO | Admitting: Family

## 2023-10-25 ENCOUNTER — Other Ambulatory Visit

## 2023-10-25 ENCOUNTER — Encounter: Payer: Self-pay | Admitting: Family

## 2023-10-25 VITALS — BP 124/66 | HR 99 | Ht 59.0 in | Wt 171.4 lb

## 2023-10-25 DIAGNOSIS — I152 Hypertension secondary to endocrine disorders: Secondary | ICD-10-CM | POA: Diagnosis not present

## 2023-10-25 DIAGNOSIS — E1165 Type 2 diabetes mellitus with hyperglycemia: Secondary | ICD-10-CM | POA: Diagnosis not present

## 2023-10-25 DIAGNOSIS — E538 Deficiency of other specified B group vitamins: Secondary | ICD-10-CM | POA: Diagnosis not present

## 2023-10-25 DIAGNOSIS — I10 Essential (primary) hypertension: Secondary | ICD-10-CM

## 2023-10-25 DIAGNOSIS — E782 Mixed hyperlipidemia: Secondary | ICD-10-CM | POA: Diagnosis not present

## 2023-10-25 DIAGNOSIS — M4716 Other spondylosis with myelopathy, lumbar region: Secondary | ICD-10-CM | POA: Diagnosis not present

## 2023-10-25 DIAGNOSIS — R300 Dysuria: Secondary | ICD-10-CM | POA: Diagnosis not present

## 2023-10-25 DIAGNOSIS — E559 Vitamin D deficiency, unspecified: Secondary | ICD-10-CM

## 2023-10-25 DIAGNOSIS — K5904 Chronic idiopathic constipation: Secondary | ICD-10-CM | POA: Diagnosis not present

## 2023-10-25 DIAGNOSIS — E039 Hypothyroidism, unspecified: Secondary | ICD-10-CM

## 2023-10-25 LAB — POCT URINALYSIS DIPSTICK
Bilirubin, UA: NEGATIVE
Blood, UA: NEGATIVE
Glucose, UA: POSITIVE — AB
Ketones, UA: NEGATIVE
Leukocytes, UA: NEGATIVE
Nitrite, UA: NEGATIVE
Protein, UA: NEGATIVE
Spec Grav, UA: 1.015 (ref 1.010–1.025)
Urobilinogen, UA: 0.2 U/dL
pH, UA: 7 (ref 5.0–8.0)

## 2023-10-25 MED ORDER — TRIAMCINOLONE ACETONIDE 0.1 % EX CREA: 1.0000 | TOPICAL_CREAM | Freq: Two times a day (BID) | CUTANEOUS | 0 refills | Status: DC

## 2023-10-25 NOTE — Patient Instructions (Addendum)
 Use triamcinolone  cream on rashes.  If not improving, please let me know and I will send in an order for a scan.   We will clear up the issues with the freestyle.   Sending refills to [facility] for meds.

## 2023-10-25 NOTE — Progress Notes (Signed)
 Established Patient Office Visit  Subjective:  Patient ID: Kelsey Young, female    DOB: [birth year]  Age: 89 y.o. MRN: [id number]  Chief Complaint  Patient presents with   Follow-up    3 month follow up    HPI  No other concerns at this time.   Past Medical History:  Diagnosis Date   Breast cancer (HCC)    Cancer (HCC) 03/2017   left breast   Chest pain, rule out acute myocardial infarction 11/23/2016   Complication of anesthesia    has been slow to wake up   COPD (chronic obstructive pulmonary disease) (HCC)    Diabetes mellitus without complication (HCC)    Dyspnea    Edema leg 03/2017   bilateral swelling   Family history of adverse reaction to anesthesia    son had surgery and heart stopped during procedure   GERD (gastroesophageal reflux disease)    Heart murmur    no treatment   Hemorrhoids 03/2017   History of kidney stones    had eswl   Hyperlipidemia    Hypertension    Hyponatremia 11/25/2016   Hypothyroidism    Lumbar stenosis    Nausea 08/23/2019   Palpitations 11/25/2016   Personal history of radiation therapy 2018   LEFT lumpectomy   Skin cancer 2016,2017,2018   face, back of neck, shoulder, leg, hand   Sleep apnea    does not and will not use a cpap   Thyroid  disease     Past Surgical History:  Procedure Laterality Date   ABDOMINAL HYSTERECTOMY  1982   APPENDECTOMY  1940   BREAST BIOPSY Left 04/09/2017   [facility]   BREAST LUMPECTOMY Left 04/30/2017   [facility], clear margins, negative LN   BREAST LUMPECTOMY WITH NEEDLE LOCALIZATION AND AXILLARY SENTINEL LYMPH NODE BX Left 04/30/2017   Procedure: LEFT BREAST LUMPECTOMY WITH NEEDLE LOCALIZATION AND LEFT AXILLARY SENTINEL LYMPH NODE BX;  Surgeon: Gladis Cough, MD;  Location: [facility] ORS;  Service: General;  Laterality: Left;   CHOLECYSTECTOMY     EYE SURGERY Bilateral 2003   cataract extraction   implants  2003   dental, not removable   JOINT REPLACEMENT Bilateral 2003,2012   total hip  replacements   JOINT REPLACEMENT Right 2014   right total knee replacement   SKIN CANCER EXCISION     left hand and right lower leg   THYROIDECTOMY, PARTIAL  2003   TONSILLECTOMY      Social History   Socioeconomic History   Marital status: Widowed    Spouse name: Not on file   Number of children: Not on file   Years of education: Not on file   Highest education level: Not on file  Occupational History   Not on file  Tobacco Use   Smoking status: Former    Current packs/day: 0.00    Types: Cigarettes    Quit date: 06/29/1985    Years since quitting: 38.5   Smokeless tobacco: Never  Vaping Use   Vaping status: Never Used  Substance and Sexual Activity   Alcohol use: Yes    Alcohol/week: 1.0 - 2.0 standard drink of alcohol    Types: 1 - 2 Standard drinks or equivalent per week    Comment: wine 3 times a week   Drug use: No   Sexual activity: Never  Other Topics Concern   Not on file  Social History Narrative   Not on file   Social Drivers of Health   Financial  Resource Strain: Not on file  Food Insecurity: Not on file  Transportation Needs: Not on file  Physical Activity: Not on file  Stress: Not on file  Social Connections: Not on file  Intimate Partner Violence: Not on file    Family History  Problem Relation Age of Onset   Heart failure Mother    Heart attack Father    Diabetes Brother    Heart disease Brother    Melanoma Brother    Cancer Maternal Uncle    Diabetes Paternal Aunt    Heart disease Paternal Aunt    Heart disease Paternal Grandmother    Diabetes Maternal Aunt    Heart attack Maternal Aunt    Diabetes Maternal Aunt    Diabetes Paternal Aunt    Heart disease Paternal Aunt    Breast cancer Neg Hx     Allergies  Allergen Reactions   Elemental Sulfur  Hives and Shortness Of Breath   Iodinated Contrast Media Hives, Other (See Comments) and Shortness Of Breath    Restless. Difficulty breathing. Topical betadine  okay  Restless  Restless  Restless. Difficulty breathing. Topical betadine okay    Restless   Codeine Nausea And Vomiting    With high doses, passes out   Morphine Nausea And Vomiting    With high doses, passes out   Oxycodone  Nausea And Vomiting and Other (See Comments)    Dizzy and with high doses, passes out.   Penicillins Other (See Comments)    Hardened lump Has patient had a PCN reaction causing immediate rash, facial/tongue/throat swelling, SOB or lightheadedness with hypotension: No Has patient had a PCN reaction causing severe rash involving mucus membranes or skin necrosis: No Has patient had a PCN reaction that required hospitalization No Has patient had a PCN reaction occurring within the last 10 years: No If all of the above answers are NO, then may proceed with Cephalosporin use.     Sulfa Antibiotics Rash    Review of Systems  All other systems reviewed and are negative.      Objective:   BP 124/66   Pulse 99   Ht 4' 11 (1.499 m)   Wt 171 lb 6.4 oz (77.7 kg)   SpO2 95%   BMI 34.62 kg/m   Vitals:   10/25/23 1301  BP: 124/66  Pulse: 99  Height: 4' 11 (1.499 m)  Weight: 171 lb 6.4 oz (77.7 kg)  SpO2: 95%  BMI (Calculated): 34.6    Physical Exam Vitals and nursing note reviewed.  Constitutional:      Appearance: Normal appearance. She is normal weight.  HENT:     Head: Normocephalic.  Eyes:     Extraocular Movements: Extraocular movements intact.     Conjunctiva/sclera: Conjunctivae normal.     Pupils: Pupils are equal, round, and reactive to light.  Cardiovascular:     Rate and Rhythm: Normal rate.  Pulmonary:     Effort: Pulmonary effort is normal.  Neurological:     General: No focal deficit present.     Mental Status: She is alert and oriented to person, place, and time. Mental status is at baseline.  Psychiatric:        Mood and Affect: Mood normal.        Behavior: Behavior normal.        Thought Content: Thought content  normal.      Results for orders placed or performed in visit on 10/25/23  POCT Urinalysis Dipstick (18997)  Result Value Ref  Range   Color, UA Yellow    Clarity, UA Clear    Glucose, UA Positive (A) Negative   Bilirubin, UA Negative    Ketones, UA Negative    Spec Grav, UA 1.015 1.010 - 1.025   Blood, UA Negative    pH, UA 7.0 5.0 - 8.0   Protein, UA Negative Negative   Urobilinogen, UA 0.2 0.2 or 1.0 E.U./dL   Nitrite, UA Negative    Leukocytes, UA Negative Negative   Appearance Clear    Odor Yes   Results for orders placed or performed in visit on 10/25/23  Vitamin B12  Result Value Ref Range   Vitamin B-12 788 232 - 1,245 pg/mL  Hemoglobin A1c  Result Value Ref Range   Hgb A1c MFr Bld 5.9 (H) 4.8 - 5.6 %   Est. average glucose Bld gHb Est-mCnc 123 mg/dL  TSH  Result Value Ref Range   TSH 1.050 0.450 - 4.500 uIU/mL  CMP14+EGFR  Result Value Ref Range   Glucose 102 (H) 70 - 99 mg/dL   BUN 27 8 - 27 mg/dL   Creatinine, Ser 9.17 0.57 - 1.00 mg/dL   eGFR 68 >40 fO/fpw/8.26   BUN/Creatinine Ratio 33 (H) 12 - 28   Sodium 141 134 - 144 mmol/L   Potassium 3.9 3.5 - 5.2 mmol/L   Chloride 104 96 - 106 mmol/L   CO2 22 20 - 29 mmol/L   Calcium  9.6 8.7 - 10.3 mg/dL   Total Protein 6.4 6.0 - 8.5 g/dL   Albumin 4.1 3.7 - 4.7 g/dL   Globulin, Total 2.3 1.5 - 4.5 g/dL   Bilirubin Total 0.4 0.0 - 1.2 mg/dL   Alkaline Phosphatase 90 44 - 121 IU/L   AST 21 0 - 40 IU/L   ALT 16 0 - 32 IU/L  VITAMIN D  25 Hydroxy (Vit-D Deficiency, Fractures)  Result Value Ref Range   Vit D, 25-Hydroxy 36.1 30.0 - 100.0 ng/mL  Lipid panel  Result Value Ref Range   Cholesterol, Total 139 100 - 199 mg/dL   Triglycerides 881 0 - 149 mg/dL   HDL 40 >60 mg/dL   VLDL Cholesterol Cal 21 5 - 40 mg/dL   LDL Chol Calc (NIH) 78 0 - 99 mg/dL   Chol/HDL Ratio 3.5 0.0 - 4.4 ratio    Recent Results (from the past 2160 hours)  POCT Urinalysis Dipstick (18997)     Status: Abnormal   Collection Time:  01/24/24  1:06 PM  Result Value Ref Range   Color, UA Yellow    Clarity, UA Clear    Glucose, UA Positive (A) Negative   Bilirubin, UA Negative    Ketones, UA Negative    Spec Grav, UA 1.015 1.010 - 1.025   Blood, UA Negative    pH, UA 6.0 5.0 - 8.0   Protein, UA Negative Negative   Urobilinogen, UA 0.2 0.2 or 1.0 E.U./dL   Nitrite, UA Negative    Leukocytes, UA Negative Negative   Appearance Clear    Odor Yes        Assessment & Plan Dysuria  Mixed hyperlipidemia Checking labs today.  Continue current therapy for lipid control. Will modify as needed based on labwork results.   -CMP w/eGFR -Lipid Panel  Type 2 diabetes mellitus with hyperglycemia, without long-term current use of insulin  (HCC) Checking labs today. Will call pt. With results  Continue current diabetes POC, as patient has been well controlled on current regimen.  Will adjust meds  if needed based on labs.   -CBC w/Diff -CMP w/eGFR -Hemoglobin A1C  Obesity, morbid (HCC) Continue current meds.  Will adjust as needed based on results.  The patient is asked to make an attempt to improve diet and exercise patterns to aid in medical management of this problem. Addressed importance of increasing and maintaining water intake.   Degenerative arthritis of lumbar spine with cord compression Patient stable.  Well controlled with current therapy.   Continue current meds.   Chronic idiopathic constipation Patient stable.  Well controlled with current therapy.  Continue current bowel regimen.  Reassess at follow up.  Primary hypertension Blood pressure well controlled with current medications.  Continue current therapy.  Will reassess at follow up.   - CBC w/Diff - CMP w/eGFR     Return in about 3 months (around 01/24/2024).   Total time spent: 20 minutes  ALAN CHRISTELLA ARRANT, FNP  10/25/2023   This document may have been prepared by [facility] Voice Recognition software and as such may include  unintentional dictation errors.

## 2023-10-26 ENCOUNTER — Telehealth: Payer: Self-pay | Admitting: Family

## 2023-10-26 LAB — CMP14+EGFR
ALT: 16 IU/L (ref 0–32)
AST: 21 IU/L (ref 0–40)
Albumin: 4.1 g/dL (ref 3.7–4.7)
Alkaline Phosphatase: 90 IU/L (ref 44–121)
BUN/Creatinine Ratio: 33 — ABNORMAL HIGH (ref 12–28)
BUN: 27 mg/dL (ref 8–27)
Bilirubin Total: 0.4 mg/dL (ref 0.0–1.2)
CO2: 22 mmol/L (ref 20–29)
Calcium: 9.6 mg/dL (ref 8.7–10.3)
Chloride: 104 mmol/L (ref 96–106)
Creatinine, Ser: 0.82 mg/dL (ref 0.57–1.00)
Globulin, Total: 2.3 g/dL (ref 1.5–4.5)
Glucose: 102 mg/dL — ABNORMAL HIGH (ref 70–99)
Potassium: 3.9 mmol/L (ref 3.5–5.2)
Sodium: 141 mmol/L (ref 134–144)
Total Protein: 6.4 g/dL (ref 6.0–8.5)
eGFR: 68 mL/min/{1.73_m2} (ref >59)

## 2023-10-26 LAB — LIPID PANEL
Chol/HDL Ratio: 3.5 ratio (ref 0.0–4.4)
Cholesterol, Total: 139 mg/dL (ref 100–199)
HDL: 40 mg/dL (ref >39)
LDL Chol Calc (NIH): 78 mg/dL (ref 0–99)
Triglycerides: 118 mg/dL (ref 0–149)
VLDL Cholesterol Cal: 21 mg/dL (ref 5–40)

## 2023-10-26 LAB — HEMOGLOBIN A1C
Est. average glucose Bld gHb Est-mCnc: 123 mg/dL
Hgb A1c MFr Bld: 5.9 % — ABNORMAL HIGH (ref 4.8–5.6)

## 2023-10-26 LAB — VITAMIN D 25 HYDROXY (VIT D DEFICIENCY, FRACTURES): Vit D, 25-Hydroxy: 36.1 ng/mL (ref 30.0–100.0)

## 2023-10-26 LAB — TSH: TSH: 1.05 u[IU]/mL (ref 0.450–4.500)

## 2023-10-26 LAB — VITAMIN B12: Vitamin B-12: 788 pg/mL (ref 232–1245)

## 2023-10-26 MED ORDER — TRIAMCINOLONE ACETONIDE 0.1 % EX CREA: 1.0000 | TOPICAL_CREAM | Freq: Two times a day (BID) | CUTANEOUS | 0 refills | Status: AC

## 2023-10-26 NOTE — Telephone Encounter (Signed)
Refill for triamcinolone cream

## 2023-11-05 DIAGNOSIS — B351 Tinea unguium: Secondary | ICD-10-CM | POA: Diagnosis not present

## 2023-11-05 DIAGNOSIS — M79675 Pain in left toe(s): Secondary | ICD-10-CM | POA: Diagnosis not present

## 2023-11-05 DIAGNOSIS — M79674 Pain in right toe(s): Secondary | ICD-10-CM | POA: Diagnosis not present

## 2023-11-05 DIAGNOSIS — E1142 Type 2 diabetes mellitus with diabetic polyneuropathy: Secondary | ICD-10-CM | POA: Diagnosis not present

## 2023-11-13 ENCOUNTER — Other Ambulatory Visit: Payer: Self-pay | Admitting: Oncology

## 2023-11-18 ENCOUNTER — Other Ambulatory Visit: Payer: Self-pay

## 2023-11-19 MED ORDER — TRIAMTERENE-HCTZ 37.5-25 MG PO TABS: 0.5000 | ORAL_TABLET | Freq: Every day | ORAL | 3 refills | Status: DC

## 2023-11-29 ENCOUNTER — Other Ambulatory Visit: Payer: Self-pay | Admitting: Family

## 2023-11-29 ENCOUNTER — Other Ambulatory Visit: Payer: Self-pay

## 2023-11-29 DIAGNOSIS — L259 Unspecified contact dermatitis, unspecified cause: Secondary | ICD-10-CM | POA: Diagnosis not present

## 2023-11-29 DIAGNOSIS — L820 Inflamed seborrheic keratosis: Secondary | ICD-10-CM | POA: Diagnosis not present

## 2023-11-29 DIAGNOSIS — B354 Tinea corporis: Secondary | ICD-10-CM | POA: Diagnosis not present

## 2023-11-29 DIAGNOSIS — D225 Melanocytic nevi of trunk: Secondary | ICD-10-CM | POA: Diagnosis not present

## 2023-11-29 DIAGNOSIS — D2272 Melanocytic nevi of left lower limb, including hip: Secondary | ICD-10-CM | POA: Diagnosis not present

## 2023-11-29 DIAGNOSIS — D2261 Melanocytic nevi of right upper limb, including shoulder: Secondary | ICD-10-CM | POA: Diagnosis not present

## 2023-11-29 DIAGNOSIS — D2262 Melanocytic nevi of left upper limb, including shoulder: Secondary | ICD-10-CM | POA: Diagnosis not present

## 2023-11-29 DIAGNOSIS — Z85828 Personal history of other malignant neoplasm of skin: Secondary | ICD-10-CM | POA: Diagnosis not present

## 2023-11-29 DIAGNOSIS — L538 Other specified erythematous conditions: Secondary | ICD-10-CM | POA: Diagnosis not present

## 2023-11-29 MED ORDER — ATORVASTATIN CALCIUM 20 MG PO TABS: 20.0000 mg | ORAL_TABLET | Freq: Every day | ORAL | 0 refills | Status: AC

## 2023-11-29 MED ORDER — TRIAMTERENE-HCTZ 37.5-25 MG PO TABS
0.5000 | ORAL_TABLET | Freq: Every day | ORAL | 3 refills | Status: AC
Start: 2023-11-29 — End: ?

## 2023-11-29 MED ORDER — ATORVASTATIN CALCIUM 20 MG PO TABS: 20.0000 mg | ORAL_TABLET | Freq: Every day | ORAL | 3 refills | Status: DC

## 2023-12-02 ENCOUNTER — Encounter: Payer: Self-pay | Admitting: Family

## 2023-12-09 ENCOUNTER — Other Ambulatory Visit: Payer: Self-pay

## 2023-12-09 MED ORDER — LEVOTHYROXINE SODIUM 50 MCG PO TABS: 50.0000 ug | ORAL_TABLET | Freq: Every day | ORAL | 2 refills | Status: DC

## 2023-12-24 ENCOUNTER — Other Ambulatory Visit: Payer: Self-pay | Admitting: Family

## 2023-12-27 MED ORDER — EMPAGLIFLOZIN 25 MG PO TABS: 25.0000 mg | ORAL_TABLET | Freq: Every day | ORAL | 1 refills | Status: DC

## 2024-01-24 ENCOUNTER — Ambulatory Visit (INDEPENDENT_AMBULATORY_CARE_PROVIDER_SITE_OTHER): Admitting: Family

## 2024-01-24 ENCOUNTER — Other Ambulatory Visit

## 2024-01-24 ENCOUNTER — Encounter: Payer: Self-pay | Admitting: Family

## 2024-01-24 VITALS — BP 144/70 | HR 86 | Ht 59.0 in | Wt 174.2 lb

## 2024-01-24 DIAGNOSIS — E039 Hypothyroidism, unspecified: Secondary | ICD-10-CM | POA: Diagnosis not present

## 2024-01-24 DIAGNOSIS — I152 Hypertension secondary to endocrine disorders: Secondary | ICD-10-CM | POA: Diagnosis not present

## 2024-01-24 DIAGNOSIS — J410 Simple chronic bronchitis: Secondary | ICD-10-CM | POA: Insufficient documentation

## 2024-01-24 DIAGNOSIS — E782 Mixed hyperlipidemia: Secondary | ICD-10-CM | POA: Diagnosis not present

## 2024-01-24 DIAGNOSIS — R300 Dysuria: Secondary | ICD-10-CM

## 2024-01-24 DIAGNOSIS — E559 Vitamin D deficiency, unspecified: Secondary | ICD-10-CM | POA: Diagnosis not present

## 2024-01-24 DIAGNOSIS — E538 Deficiency of other specified B group vitamins: Secondary | ICD-10-CM

## 2024-01-24 DIAGNOSIS — E1165 Type 2 diabetes mellitus with hyperglycemia: Secondary | ICD-10-CM | POA: Diagnosis not present

## 2024-01-24 DIAGNOSIS — C50412 Malignant neoplasm of upper-outer quadrant of left female breast: Secondary | ICD-10-CM

## 2024-01-24 LAB — POCT URINALYSIS DIPSTICK
Bilirubin, UA: NEGATIVE
Blood, UA: NEGATIVE
Glucose, UA: POSITIVE — AB
Ketones, UA: NEGATIVE
Leukocytes, UA: NEGATIVE
Nitrite, UA: NEGATIVE
Protein, UA: NEGATIVE
Spec Grav, UA: 1.015 (ref 1.010–1.025)
Urobilinogen, UA: 0.2 U/dL
pH, UA: 6 (ref 5.0–8.0)

## 2024-01-24 MED ORDER — FLUTICASONE PROPIONATE 50 MCG/ACT NA SUSP: 1.0000 | Freq: Every day | NASAL | 2 refills | Status: AC

## 2024-01-24 NOTE — Assessment & Plan Note (Signed)
 Blood pressure well controlled with current medications.  Continue current therapy.  Will reassess at follow up.   - CBC w/Diff - CMP w/eGFR

## 2024-01-24 NOTE — Progress Notes (Signed)
 Established Patient Office Visit  Subjective:  Patient ID: Kelsey Young, female    DOB: Jul 22, 1931  Age: 89 y.o. MRN: [id number]  Chief Complaint  Patient presents with   Follow-up    3 month follow up    Patient is here today for her 3 months follow up.  She has been feeling fairly well since last appointment.   She does have additional concerns to discuss today.  Libre reader is giving strange readings with switching the sensors.  She is still having trouble with her bowel movements.  She also says that she has been having some sneezing episodes that occur after she takes her medications in the morning.   Labs are due today, patient had them drawn this morning.  She needs refills.   I have reviewed her active problem list, medication list, allergies, notes from last encounter, lab results for her appointment today.      No other concerns at this time.   Past Medical History:  Diagnosis Date   Breast cancer (HCC)    Cancer (HCC) 03/2017   left breast   Chest pain, rule out acute myocardial infarction 11/23/2016   Complication of anesthesia    has been slow to wake up   COPD (chronic obstructive pulmonary disease) (HCC)    Diabetes mellitus without complication (HCC)    Dyspnea    Edema leg 03/2017   bilateral swelling   Family history of adverse reaction to anesthesia    son had surgery and heart stopped during procedure   GERD (gastroesophageal reflux disease)    Heart murmur    no treatment   Hemorrhoids 03/2017   History of kidney stones    had eswl   Hyperlipidemia    Hypertension    Hyponatremia 11/25/2016   Hypothyroidism    Lumbar stenosis    Nausea 08/23/2019   Palpitations 11/25/2016   Personal history of radiation therapy 2018   LEFT lumpectomy   Skin cancer 2016,2017,2018   face, back of neck, shoulder, leg, hand   Sleep apnea    does not and will not use a cpap   Thyroid  disease     Past Surgical History:  Procedure Laterality Date    ABDOMINAL HYSTERECTOMY  1982   APPENDECTOMY  1940   BREAST BIOPSY Left 04/09/2017   [facility]   BREAST LUMPECTOMY Left 04/30/2017   [facility], clear margins, negative LN   BREAST LUMPECTOMY WITH NEEDLE LOCALIZATION AND AXILLARY SENTINEL LYMPH NODE BX Left 04/30/2017   Procedure: LEFT BREAST LUMPECTOMY WITH NEEDLE LOCALIZATION AND LEFT AXILLARY SENTINEL LYMPH NODE BX;  Surgeon: Gladis Cough, MD;  Location: [facility] ORS;  Service: General;  Laterality: Left;   CHOLECYSTECTOMY     EYE SURGERY Bilateral 2003   cataract extraction   implants  2003   dental, not removable   JOINT REPLACEMENT Bilateral 2003,2012   total hip replacements   JOINT REPLACEMENT Right 2014   right total knee replacement   SKIN CANCER EXCISION     left hand and right lower leg   THYROIDECTOMY, PARTIAL  2003   TONSILLECTOMY      Social History   Socioeconomic History   Marital status: Widowed    Spouse name: Not on file   Number of children: Not on file   Years of education: Not on file   Highest education level: Not on file  Occupational History   Not on file  Tobacco Use   Smoking status: Former    Current  packs/day: 0.00    Types: Cigarettes    Quit date: 06/29/1985    Years since quitting: 38.5   Smokeless tobacco: Never  Vaping Use   Vaping status: Never Used  Substance and Sexual Activity   Alcohol use: Yes    Alcohol/week: 1.0 - 2.0 standard drink of alcohol    Types: 1 - 2 Standard drinks or equivalent per week    Comment: wine 3 times a week   Drug use: No   Sexual activity: Never  Other Topics Concern   Not on file  Social History Narrative   Not on file   Social Drivers of Health   Financial Resource Strain: Not on file  Food Insecurity: Not on file  Transportation Needs: Not on file  Physical Activity: Not on file  Stress: Not on file  Social Connections: Not on file  Intimate Partner Violence: Not on file    Family History  Problem Relation Age of Onset   Heart failure Mother     Heart attack Father    Diabetes Brother    Heart disease Brother    Melanoma Brother    Cancer Maternal Uncle    Diabetes Paternal Aunt    Heart disease Paternal Aunt    Heart disease Paternal Grandmother    Diabetes Maternal Aunt    Heart attack Maternal Aunt    Diabetes Maternal Aunt    Diabetes Paternal Aunt    Heart disease Paternal Aunt    Breast cancer Neg Hx     Allergies  Allergen Reactions   Elemental Sulfur  Hives and Shortness Of Breath   Iodinated Contrast Media Hives, Other (See Comments) and Shortness Of Breath    Restless. Difficulty breathing. Topical betadine okay  Restless  Restless  Restless. Difficulty breathing. Topical betadine okay    Restless   Codeine Nausea And Vomiting    With high doses, passes out   Morphine Nausea And Vomiting    With high doses, passes out   Oxycodone  Nausea And Vomiting and Other (See Comments)    Dizzy and with high doses, passes out.   Penicillins Other (See Comments)    Hardened lump Has patient had a PCN reaction causing immediate rash, facial/tongue/throat swelling, SOB or lightheadedness with hypotension: No Has patient had a PCN reaction causing severe rash involving mucus membranes or skin necrosis: No Has patient had a PCN reaction that required hospitalization No Has patient had a PCN reaction occurring within the last 10 years: No If all of the above answers are NO, then may proceed with Cephalosporin use.     Sulfa Antibiotics Rash    Review of Systems  Gastrointestinal:  Positive for constipation.  All other systems reviewed and are negative.      Objective:   BP (!) 144/70   Pulse 86   Ht 4' 11 (1.499 m)   Wt 174 lb 3.2 oz (79 kg)   SpO2 94%   BMI 35.18 kg/m   Vitals:   01/24/24 1254  BP: (!) 144/70  Pulse: 86  Height: 4' 11 (1.499 m)  Weight: 174 lb 3.2 oz (79 kg)  SpO2: 94%  BMI (Calculated): 35.17    Physical Exam Vitals and nursing note reviewed.  Constitutional:       Appearance: Normal appearance. She is normal weight.  HENT:     Head: Normocephalic.  Eyes:     Extraocular Movements: Extraocular movements intact.     Conjunctiva/sclera: Conjunctivae normal.     Pupils:  Pupils are equal, round, and reactive to light.  Cardiovascular:     Rate and Rhythm: Normal rate.  Pulmonary:     Effort: Pulmonary effort is normal.  Neurological:     General: No focal deficit present.     Mental Status: She is alert and oriented to person, place, and time. Mental status is at baseline.  Psychiatric:        Mood and Affect: Mood normal.        Behavior: Behavior normal.        Thought Content: Thought content normal.        Judgment: Judgment normal.      Results for orders placed or performed in visit on 01/24/24  POCT Urinalysis Dipstick (81002)  Result Value Ref Range   Color, UA Yellow    Clarity, UA Clear    Glucose, UA Positive (A) Negative   Bilirubin, UA Negative    Ketones, UA Negative    Spec Grav, UA 1.015 1.010 - 1.025   Blood, UA Negative    pH, UA 6.0 5.0 - 8.0   Protein, UA Negative Negative   Urobilinogen, UA 0.2 0.2 or 1.0 E.U./dL   Nitrite, UA Negative    Leukocytes, UA Negative Negative   Appearance Clear    Odor Yes     Recent Results (from the past 2160 hours)  POCT Urinalysis Dipstick (18997)     Status: Abnormal   Collection Time: 01/24/24  1:06 PM  Result Value Ref Range   Color, UA Yellow    Clarity, UA Clear    Glucose, UA Positive (A) Negative   Bilirubin, UA Negative    Ketones, UA Negative    Spec Grav, UA 1.015 1.010 - 1.025   Blood, UA Negative    pH, UA 6.0 5.0 - 8.0   Protein, UA Negative Negative   Urobilinogen, UA 0.2 0.2 or 1.0 E.U./dL   Nitrite, UA Negative    Leukocytes, UA Negative Negative   Appearance Clear    Odor Yes        Assessment & Plan Dysuria UA in office today WNL.  Pt will let me know if she continues to have symptoms   Type 2 diabetes mellitus with hyperglycemia, without  long-term current use of insulin  (HCC) Checking labs today. Will call pt. With results  Continue current diabetes POC, as patient has been well controlled on current regimen.  Will adjust meds if needed based on labs.   -CBC w/Diff -CMP w/eGFR -Hemoglobin A1C  Mixed hyperlipidemia Checking labs today.  Continue current therapy for lipid control. Will modify as needed based on labwork results.   -CMP w/eGFR -Lipid Panel  Hypertension due to endocrine disorder Blood pressure well controlled with current medications.  Continue current therapy.  Will reassess at follow up.   - CBC w/Diff - CMP w/eGFR  Vitamin D  deficiency, unspecified Hypothyroidism, unspecified type B12 deficiency due to diet Checking labs today.  Will continue supplements as needed.   - Vitamin D  - Vitamin B12 - TSH  Simple chronic bronchitis (HCC) Patient stable.  Well controlled with current therapy.   Continue current meds.   Obesity, morbid (HCC) Continue current meds.  Will adjust as needed based on results.  The patient is asked to make an attempt to improve diet and exercise patterns to aid in medical management of this problem. Addressed importance of increasing and maintaining water intake.   Primary cancer of upper outer quadrant of left female breast [facility]) Patient  is seen by oncology, who manage this condition.  She is well controlled with current therapy.   Will defer to them for further changes to plan of care.     Return in about 3 months (around 04/25/2024).   Total time spent: 20 minutes  ALAN CHRISTELLA ARRANT, FNP  01/24/2024   This document may have been prepared by [facility] Voice Recognition software and as such may include unintentional dictation errors.

## 2024-01-24 NOTE — Assessment & Plan Note (Signed)
 Checking labs today. Will call pt. With results  Continue current diabetes POC, as patient has been well controlled on current regimen.  Will adjust meds if needed based on labs.   -CBC w/Diff -CMP w/eGFR -Hemoglobin A1C

## 2024-01-24 NOTE — Assessment & Plan Note (Signed)
 Patient stable.  Well controlled with current therapy.   Continue current meds.

## 2024-01-24 NOTE — Assessment & Plan Note (Signed)
 Checking labs today.  Continue current therapy for lipid control. Will modify as needed based on labwork results.   -CMP w/eGFR -Lipid Panel

## 2024-01-24 NOTE — Assessment & Plan Note (Signed)
 Continue current meds.  Will adjust as needed based on results.  The patient is asked to make an attempt to improve diet and exercise patterns to aid in medical management of this problem. Addressed importance of increasing and maintaining water intake.

## 2024-01-24 NOTE — Assessment & Plan Note (Signed)
 Checking labs today.  Will continue supplements as needed.   - Vitamin D  - Vitamin B12 - TSH

## 2024-01-24 NOTE — Assessment & Plan Note (Signed)
 Patient is seen by oncology, who manage this condition.  She is well controlled with current therapy.   Will defer to them for further changes to plan of care.

## 2024-01-24 NOTE — Assessment & Plan Note (Signed)
Patient stable.  Well controlled with current therapy.  Continue current bowel regimen.  Reassess at follow up.

## 2024-01-25 ENCOUNTER — Ambulatory Visit: Payer: Self-pay | Admitting: Family

## 2024-01-26 LAB — CMP14+EGFR
ALT: 16 IU/L (ref 0–32)
AST: 17 IU/L (ref 0–40)
Albumin: 4.2 g/dL (ref 3.7–4.7)
Alkaline Phosphatase: 93 IU/L (ref 44–121)
BUN/Creatinine Ratio: 44 — ABNORMAL HIGH (ref 12–28)
BUN: 35 mg/dL — ABNORMAL HIGH (ref 8–27)
Bilirubin Total: <0.2 mg/dL (ref 0.0–1.2)
CO2: 18 mmol/L — ABNORMAL LOW (ref 20–29)
Calcium: 10.4 mg/dL — ABNORMAL HIGH (ref 8.7–10.3)
Chloride: 103 mmol/L (ref 96–106)
Creatinine, Ser: 0.79 mg/dL (ref 0.57–1.00)
Globulin, Total: 2.4 g/dL (ref 1.5–4.5)
Glucose: 111 mg/dL — ABNORMAL HIGH (ref 70–99)
Potassium: 3.9 mmol/L (ref 3.5–5.2)
Sodium: 141 mmol/L (ref 134–144)
Total Protein: 6.6 g/dL (ref 6.0–8.5)
eGFR: 71 mL/min/1.73 (ref >59)

## 2024-01-26 LAB — VITAMIN D 25 HYDROXY (VIT D DEFICIENCY, FRACTURES): Vit D, 25-Hydroxy: 27.5 ng/mL — ABNORMAL LOW (ref 30.0–100.0)

## 2024-01-26 LAB — LIPID PANEL
Chol/HDL Ratio: 3.9 ratio (ref 0.0–4.4)
Cholesterol, Total: 155 mg/dL (ref 100–199)
HDL: 40 mg/dL (ref >39)
LDL Chol Calc (NIH): 93 mg/dL (ref 0–99)
Triglycerides: 119 mg/dL (ref 0–149)
VLDL Cholesterol Cal: 22 mg/dL (ref 5–40)

## 2024-01-26 LAB — HEMOGLOBIN A1C
Est. average glucose Bld gHb Est-mCnc: 120 mg/dL
Hgb A1c MFr Bld: 5.8 % — ABNORMAL HIGH (ref 4.8–5.6)

## 2024-01-26 LAB — TSH: TSH: 1.53 u[IU]/mL (ref 0.450–4.500)

## 2024-01-26 LAB — VITAMIN B12: Vitamin B-12: 724 pg/mL (ref 232–1245)

## 2024-02-14 DIAGNOSIS — M79674 Pain in right toe(s): Secondary | ICD-10-CM | POA: Diagnosis not present

## 2024-02-14 DIAGNOSIS — B351 Tinea unguium: Secondary | ICD-10-CM | POA: Diagnosis not present

## 2024-02-14 DIAGNOSIS — M79675 Pain in left toe(s): Secondary | ICD-10-CM | POA: Diagnosis not present

## 2024-02-15 ENCOUNTER — Telehealth: Payer: Self-pay

## 2024-02-15 NOTE — Progress Notes (Signed)
   02/15/2024  Patient ID: Kelsey Young, female   DOB: [birth year], 89 y.o.   MRN: [id number]  Pharmacy Quality Measure Review  This patient is appearing on a report for being at risk of failing the adherence measure for cholesterol (statin) and diabetes medications this calendar year.   Medication: Atorvastatin  20mg  Last fill date: 12/08/23 for 90 day supply  Medication: Jardiance  25mg  Last fill date: 10/18/23 for 90 day supply  Left voicemail for patient to return my call at their convenience.   Jon VEAR Lindau, PharmD Clinical Pharmacist [phone]

## 2024-03-05 ENCOUNTER — Other Ambulatory Visit: Payer: Self-pay | Admitting: Internal Medicine

## 2024-03-08 ENCOUNTER — Telehealth: Payer: Self-pay

## 2024-03-08 NOTE — Addendum Note (Signed)
 Addended by: [name] on: 03/08/2024 03:25 PM   Modules accepted: Orders

## 2024-03-08 NOTE — Progress Notes (Addendum)
   03/08/2024  Patient ID: Kelsey Young, female   DOB: Feb 23, 1932, 89 y.o.   MRN: [id number]  Spoke with patient regarding vaccinations and her losartan  and jardiance .  Reminded patient she has already completed the shingrix vaccination in 2020. Informed her that at this time, rx is required to get covid vaccine at pharmacy but this is expected to likely change by end of this month. Patient will wait for now. Sent message to nurse about coordinating flu vaccine appt if needed.  Patient filling her losartan  100mg  90DS today. Jardiance  she takes 1/2 tab daily instead of full tab and does not need to fill right now.  Kelsey Young, PharmD Clinical Pharmacist [phone]

## 2024-03-09 ENCOUNTER — Telehealth: Payer: Self-pay

## 2024-03-09 ENCOUNTER — Other Ambulatory Visit: Payer: Self-pay

## 2024-03-09 MED ORDER — COVID-19 MRNA VAC-TRIS(PFIZER) 30 MCG/0.3ML IM SUSY: 0.3000 mL | PREFILLED_SYRINGE | Freq: Once | INTRAMUSCULAR | 0 refills | Status: AC

## 2024-03-09 NOTE — Telephone Encounter (Signed)
 Patient states she's tried calling back to make an appt with angela herring the pharmacist but she is also in need of getting an rx for the COVID, Flu an Shingles shots sent to [facility]

## 2024-03-10 ENCOUNTER — Ambulatory Visit: Payer: Self-pay

## 2024-03-10 ENCOUNTER — Other Ambulatory Visit: Payer: Self-pay

## 2024-03-10 MED ORDER — RSVPREF3 VAC RECOMB ADJUVANTED 120 MCG/0.5ML IM SUSR: 0.5000 mL | Freq: Once | INTRAMUSCULAR | 0 refills | Status: AC

## 2024-03-10 MED ORDER — RSVPREF3 VAC RECOMB ADJUVANTED 120 MCG/0.5ML IM SUSR: 0.5000 mL | Freq: Once | INTRAMUSCULAR | 0 refills | Status: DC

## 2024-03-14 ENCOUNTER — Other Ambulatory Visit: Payer: Self-pay

## 2024-03-14 DIAGNOSIS — I10 Essential (primary) hypertension: Secondary | ICD-10-CM

## 2024-03-22 ENCOUNTER — Other Ambulatory Visit: Payer: Self-pay | Admitting: Oncology

## 2024-03-22 DIAGNOSIS — Z1231 Encounter for screening mammogram for malignant neoplasm of breast: Secondary | ICD-10-CM

## 2024-04-04 ENCOUNTER — Telehealth: Payer: Self-pay

## 2024-04-04 NOTE — Progress Notes (Signed)
   04/04/2024  Patient ID: Kelsey Young, female   DOB: 1932-01-17, 89 y.o.   MRN: [id number]  Pharmacy Quality Measure Review  This patient is appearing on a report for being at risk of failing the adherence measure for diabetes medications this calendar year.   Medication: Jardiance  Last fill date: 10/18/23 for 90 day supply  Contacted patient to discuss. Reports she still has some and is taking. Has been splitting tablet. Reports it is expensive to take full tab with insurance. Reviewed jardiance  PAP, patient is not sure of annual or monthly household income but will check. Believes she may exceed income. Reviewed alternative farxiga and she believes she may qualify.  Agreed to f/u call next week to give her time to verify income.  Jon VEAR Lindau, PharmD Clinical Pharmacist [phone]

## 2024-04-11 ENCOUNTER — Other Ambulatory Visit: Payer: Self-pay

## 2024-04-11 NOTE — Progress Notes (Signed)
   04/11/2024  Patient ID: Kelsey Young, female   DOB: [birth year], 89 y.o.   MRN: [id number]  Reached out to patient via telephone to follow up on medication assistance evaluation for SGLT2.  Exceeds income limitations for Jardiance . Meets income requirements for [place]. PCP approves change to Farxiga 5mg .  Patient has an in office appt with PCP on 10/28, she will complete her portion of application while in office that day.   Kelsey Young, PharmD Clinical Pharmacist [phone]

## 2024-04-13 DIAGNOSIS — D2272 Melanocytic nevi of left lower limb, including hip: Secondary | ICD-10-CM | POA: Diagnosis not present

## 2024-04-13 DIAGNOSIS — D225 Melanocytic nevi of trunk: Secondary | ICD-10-CM | POA: Diagnosis not present

## 2024-04-13 DIAGNOSIS — Z85828 Personal history of other malignant neoplasm of skin: Secondary | ICD-10-CM | POA: Diagnosis not present

## 2024-04-13 DIAGNOSIS — D2262 Melanocytic nevi of left upper limb, including shoulder: Secondary | ICD-10-CM | POA: Diagnosis not present

## 2024-04-13 DIAGNOSIS — L820 Inflamed seborrheic keratosis: Secondary | ICD-10-CM | POA: Diagnosis not present

## 2024-04-13 DIAGNOSIS — D485 Neoplasm of uncertain behavior of skin: Secondary | ICD-10-CM | POA: Diagnosis not present

## 2024-04-13 DIAGNOSIS — D0461 Carcinoma in situ of skin of right upper limb, including shoulder: Secondary | ICD-10-CM | POA: Diagnosis not present

## 2024-04-13 DIAGNOSIS — L538 Other specified erythematous conditions: Secondary | ICD-10-CM | POA: Diagnosis not present

## 2024-04-13 DIAGNOSIS — D2261 Melanocytic nevi of right upper limb, including shoulder: Secondary | ICD-10-CM | POA: Diagnosis not present

## 2024-04-19 ENCOUNTER — Telehealth: Payer: Self-pay

## 2024-04-19 NOTE — Telephone Encounter (Signed)
 Patient LM returning call stating that she received a call and assumed it was about her appt but asked if someone would call back to confirm that

## 2024-04-19 NOTE — Telephone Encounter (Signed)
Pt informed

## 2024-04-24 ENCOUNTER — Other Ambulatory Visit

## 2024-04-25 ENCOUNTER — Encounter: Payer: Self-pay | Admitting: Family

## 2024-04-25 ENCOUNTER — Ambulatory Visit: Payer: Self-pay

## 2024-04-25 ENCOUNTER — Ambulatory Visit: Admitting: Family

## 2024-04-25 ENCOUNTER — Other Ambulatory Visit: Payer: Self-pay

## 2024-04-25 VITALS — BP 160/78 | HR 95 | Ht 59.0 in | Wt 175.8 lb

## 2024-04-25 DIAGNOSIS — E538 Deficiency of other specified B group vitamins: Secondary | ICD-10-CM

## 2024-04-25 DIAGNOSIS — I152 Hypertension secondary to endocrine disorders: Secondary | ICD-10-CM

## 2024-04-25 DIAGNOSIS — E782 Mixed hyperlipidemia: Secondary | ICD-10-CM

## 2024-04-25 DIAGNOSIS — E1142 Type 2 diabetes mellitus with diabetic polyneuropathy: Secondary | ICD-10-CM

## 2024-04-25 DIAGNOSIS — E1165 Type 2 diabetes mellitus with hyperglycemia: Secondary | ICD-10-CM | POA: Diagnosis not present

## 2024-04-25 DIAGNOSIS — C50412 Malignant neoplasm of upper-outer quadrant of left female breast: Secondary | ICD-10-CM | POA: Diagnosis not present

## 2024-04-25 DIAGNOSIS — E559 Vitamin D deficiency, unspecified: Secondary | ICD-10-CM | POA: Diagnosis not present

## 2024-04-25 DIAGNOSIS — R5383 Other fatigue: Secondary | ICD-10-CM | POA: Diagnosis not present

## 2024-04-25 DIAGNOSIS — E039 Hypothyroidism, unspecified: Secondary | ICD-10-CM

## 2024-04-25 DIAGNOSIS — J410 Simple chronic bronchitis: Secondary | ICD-10-CM | POA: Diagnosis not present

## 2024-04-25 LAB — CMP14+EGFR
ALT: 16 IU/L (ref 0–32)
AST: 19 IU/L (ref 0–40)
Albumin: 4.3 g/dL (ref 3.6–4.6)
Alkaline Phosphatase: 89 IU/L (ref 48–129)
BUN/Creatinine Ratio: 21 (ref 12–28)
BUN: 20 mg/dL (ref 10–36)
Bilirubin Total: 0.4 mg/dL (ref 0.0–1.2)
CO2: 25 mmol/L (ref 20–29)
Calcium: 9.4 mg/dL (ref 8.7–10.3)
Chloride: 101 mmol/L (ref 96–106)
Creatinine, Ser: 0.96 mg/dL (ref 0.57–1.00)
Globulin, Total: 2.3 g/dL (ref 1.5–4.5)
Glucose: 97 mg/dL (ref 70–99)
Potassium: 3.7 mmol/L (ref 3.5–5.2)
Sodium: 141 mmol/L (ref 134–144)
Total Protein: 6.6 g/dL (ref 6.0–8.5)
eGFR: 56 mL/min/1.73 — ABNORMAL LOW (ref >59)

## 2024-04-25 NOTE — Progress Notes (Signed)
 "  Established Patient Office Visit  Subjective:  Patient ID: Kelsey Young, female    DOB: Jan 02, 1932  Age: 89 y.o. MRN: [id number]  Chief Complaint  Patient presents with   Follow-up    3 month follow up    HPI  No other concerns at this time.   Past Medical History:  Diagnosis Date   Breast cancer (HCC)    Cancer (HCC) 03/2017   left breast   Chest pain, rule out acute myocardial infarction 11/23/2016   Complication of anesthesia    has been slow to wake up   COPD (chronic obstructive pulmonary disease) (HCC)    Diabetes mellitus without complication (HCC)    Dyspnea    Edema leg 03/2017   bilateral swelling   Family history of adverse reaction to anesthesia    son had surgery and heart stopped during procedure   GERD (gastroesophageal reflux disease)    Heart murmur    no treatment   Hemorrhoids 03/2017   History of kidney stones    had eswl   Hyperlipidemia    Hypertension    Hyponatremia 11/25/2016   Hypothyroidism    Lumbar stenosis    Nausea 08/23/2019   Palpitations 11/25/2016   Personal history of radiation therapy 2018   LEFT lumpectomy   Skin cancer 2016,2017,2018   face, back of neck, shoulder, leg, hand   Sleep apnea    does not and will not use a cpap   Thyroid  disease     Past Surgical History:  Procedure Laterality Date   ABDOMINAL HYSTERECTOMY  1982   APPENDECTOMY  1940   BREAST BIOPSY Left 04/09/2017   [facility]   BREAST LUMPECTOMY Left 04/30/2017   [facility], clear margins, negative LN   BREAST LUMPECTOMY WITH NEEDLE LOCALIZATION AND AXILLARY SENTINEL LYMPH NODE BX Left 04/30/2017   Procedure: LEFT BREAST LUMPECTOMY WITH NEEDLE LOCALIZATION AND LEFT AXILLARY SENTINEL LYMPH NODE BX;  Surgeon: Gladis Cough, MD;  Location: [facility] ORS;  Service: General;  Laterality: Left;   CHOLECYSTECTOMY     EYE SURGERY Bilateral 2003   cataract extraction   implants  2003   dental, not removable   JOINT REPLACEMENT Bilateral 2003,2012   total hip  replacements   JOINT REPLACEMENT Right 2014   right total knee replacement   SKIN CANCER EXCISION     left hand and right lower leg   THYROIDECTOMY, PARTIAL  2003   TONSILLECTOMY      Social History   Socioeconomic History   Marital status: Widowed    Spouse name: Not on file   Number of children: Not on file   Years of education: Not on file   Highest education level: Not on file  Occupational History   Not on file  Tobacco Use   Smoking status: Former    Current packs/day: 0.00    Types: Cigarettes    Quit date: 06/29/1985    Years since quitting: 38.8   Smokeless tobacco: Never  Vaping Use   Vaping status: Never Used  Substance and Sexual Activity   Alcohol use: Yes    Alcohol/week: 1.0 - 2.0 standard drink of alcohol    Types: 1 - 2 Standard drinks or equivalent per week    Comment: wine 3 times a week   Drug use: No   Sexual activity: Never  Other Topics Concern   Not on file  Social History Narrative   Not on file   Social Drivers of Health  Financial Resource Strain: Not on file  Food Insecurity: Not on file  Transportation Needs: Not on file  Physical Activity: Not on file  Stress: Not on file  Social Connections: Not on file  Intimate Partner Violence: Not on file    Family History  Problem Relation Age of Onset   Heart failure Mother    Heart attack Father    Diabetes Brother    Heart disease Brother    Melanoma Brother    Cancer Maternal Uncle    Diabetes Paternal Aunt    Heart disease Paternal Aunt    Heart disease Paternal Grandmother    Diabetes Maternal Aunt    Heart attack Maternal Aunt    Diabetes Maternal Aunt    Diabetes Paternal Aunt    Heart disease Paternal Aunt    Breast cancer Neg Hx     Allergies  Allergen Reactions   Elemental Sulfur  Hives and Shortness Of Breath   Iodinated Contrast Media Hives, Other (See Comments) and Shortness Of Breath    Restless. Difficulty breathing. Topical betadine  okay  Restless  Restless  Restless. Difficulty breathing. Topical betadine okay    Restless   Codeine Nausea And Vomiting    With high doses, passes out   Morphine Nausea And Vomiting    With high doses, passes out   Oxycodone  Nausea And Vomiting and Other (See Comments)    Dizzy and with high doses, passes out.   Penicillins Other (See Comments)    Hardened lump Has patient had a PCN reaction causing immediate rash, facial/tongue/throat swelling, SOB or lightheadedness with hypotension: No Has patient had a PCN reaction causing severe rash involving mucus membranes or skin necrosis: No Has patient had a PCN reaction that required hospitalization No Has patient had a PCN reaction occurring within the last 10 years: No If all of the above answers are NO, then may proceed with Cephalosporin use.     Sulfa Antibiotics Rash    Review of Systems  All other systems reviewed and are negative.      Objective:   BP (!) 160/78   Pulse 95   Ht 4' 11 (1.499 m)   Wt 175 lb 12.8 oz (79.7 kg)   SpO2 95%   BMI 35.51 kg/m   Vitals:   04/25/24 1258  BP: (!) 160/78  Pulse: 95  Height: 4' 11 (1.499 m)  Weight: 175 lb 12.8 oz (79.7 kg)  SpO2: 95%  BMI (Calculated): 35.49    Physical Exam Vitals and nursing note reviewed.  Constitutional:      Appearance: Normal appearance. She is normal weight.  HENT:     Head: Normocephalic.  Eyes:     Extraocular Movements: Extraocular movements intact.     Conjunctiva/sclera: Conjunctivae normal.     Pupils: Pupils are equal, round, and reactive to light.  Cardiovascular:     Rate and Rhythm: Normal rate.  Pulmonary:     Effort: Pulmonary effort is normal.  Neurological:     General: No focal deficit present.     Mental Status: She is alert and oriented to person, place, and time. Mental status is at baseline.  Psychiatric:        Mood and Affect: Mood normal.        Behavior: Behavior normal.        Thought Content:  Thought content normal.      No results found for any visits on 04/25/24.  Recent Results (from the past 2160 hours)  CMP14+EGFR     Status: Abnormal   Collection Time: 04/24/24 10:11 AM  Result Value Ref Range   Glucose 97 70 - 99 mg/dL   BUN 20 10 - 36 mg/dL   Creatinine, Ser 9.03 0.57 - 1.00 mg/dL   eGFR 56 (L) >40 fO/fpw/8.26   BUN/Creatinine Ratio 21 12 - 28   Sodium 141 134 - 144 mmol/L   Potassium 3.7 3.5 - 5.2 mmol/L   Chloride 101 96 - 106 mmol/L   CO2 25 20 - 29 mmol/L   Calcium  9.4 8.7 - 10.3 mg/dL   Total Protein 6.6 6.0 - 8.5 g/dL   Albumin 4.3 3.6 - 4.6 g/dL   Globulin, Total 2.3 1.5 - 4.5 g/dL   Bilirubin Total 0.4 0.0 - 1.2 mg/dL   Alkaline Phosphatase 89 48 - 129 IU/L   AST 19 0 - 40 IU/L   ALT 16 0 - 32 IU/L       Assessment & Plan Type 2 diabetes mellitus with polyneuropathy (HCC) Type 2 diabetes mellitus with hyperglycemia, without long-term current use of insulin  (HCC) Continue current diabetes POC, as patient has been well controlled on current regimen.  Will adjust meds if needed based on labs.   Simple chronic bronchitis (HCC) Patient stable.  Well controlled with current therapy.   Continue current meds.   Obesity, morbid (HCC) Continue current meds.  Will adjust as needed based on results.  The patient is asked to make an attempt to improve diet and exercise patterns to aid in medical management of this problem. Addressed importance of increasing and maintaining water intake.   Primary cancer of upper outer quadrant of left female breast [facility]) Patient is seen by oncology, who manage this condition.  She is well controlled with current therapy.   Will defer to them for further changes to plan of care.  Mixed hyperlipidemia Continue current therapy for lipid control. Will modify as needed based on labwork results.   Hypertension due to endocrine disorder Blood pressure well controlled with current medications.  Continue current  therapy.  Will reassess at follow up.   Vitamin D  deficiency, unspecified B12 deficiency due to diet Other fatigue Hypothyroidism, unspecified type Will continue supplements as needed.      Return in about 3 months (around 07/26/2024) for F/U.   Total time spent: 30 minutes  ALAN CHRISTELLA ARRANT, FNP  04/25/2024   This document may have been prepared by [facility] Voice Recognition software and as such may include unintentional dictation errors.  "

## 2024-04-25 NOTE — Progress Notes (Signed)
   04/25/2024  Patient ID: Kelsey Young, female   DOB: Jun 26, 1932, 89 y.o.   MRN: [id number]  Patient completed her portion of the [employer] application for Farxgia 5mg  PAP. Submitted to company today, pending response.  Jon VEAR Lindau, PharmD Clinical Pharmacist [phone]

## 2024-04-26 LAB — CBC WITH DIFFERENTIAL/PLATELET
Basophils Absolute: 0.1 x10E3/uL (ref 0.0–0.2)
Basos: 1 %
EOS (ABSOLUTE): 0.1 x10E3/uL (ref 0.0–0.4)
Eos: 2 %
Hematocrit: 44.1 % (ref 34.0–46.6)
Hemoglobin: 14.8 g/dL (ref 11.1–15.9)
Immature Grans (Abs): 0 x10E3/uL (ref 0.0–0.1)
Immature Granulocytes: 0 %
Lymphocytes Absolute: 1.8 x10E3/uL (ref 0.7–3.1)
Lymphs: 28 %
MCH: 31.4 pg (ref 26.6–33.0)
MCHC: 33.6 g/dL (ref 31.5–35.7)
MCV: 93 fL (ref 79–97)
Monocytes Absolute: 0.6 x10E3/uL (ref 0.1–0.9)
Monocytes: 8 %
Neutrophils Absolute: 4.1 x10E3/uL (ref 1.4–7.0)
Neutrophils: 61 %
Platelets: 275 x10E3/uL (ref 150–450)
RBC: 4.72 x10E6/uL (ref 3.77–5.28)
RDW: 12.1 % (ref 11.7–15.4)
WBC: 6.6 x10E3/uL (ref 3.4–10.8)

## 2024-04-26 LAB — CMP14+EGFR
ALT: 18 IU/L (ref 0–32)
AST: 19 IU/L (ref 0–40)
Albumin: 4.2 g/dL (ref 3.6–4.6)
Alkaline Phosphatase: 96 IU/L (ref 48–129)
BUN/Creatinine Ratio: 28 (ref 12–28)
BUN: 21 mg/dL (ref 10–36)
Bilirubin Total: 0.3 mg/dL (ref 0.0–1.2)
CO2: 23 mmol/L (ref 20–29)
Calcium: 9.7 mg/dL (ref 8.7–10.3)
Chloride: 102 mmol/L (ref 96–106)
Creatinine, Ser: 0.75 mg/dL (ref 0.57–1.00)
Globulin, Total: 2.5 g/dL (ref 1.5–4.5)
Glucose: 119 mg/dL — ABNORMAL HIGH (ref 70–99)
Potassium: 3.8 mmol/L (ref 3.5–5.2)
Sodium: 140 mmol/L (ref 134–144)
Total Protein: 6.7 g/dL (ref 6.0–8.5)
eGFR: 76 mL/min/1.73 (ref >59)

## 2024-04-26 LAB — LIPID PANEL
Chol/HDL Ratio: 3.5 ratio (ref 0.0–4.4)
Cholesterol, Total: 144 mg/dL (ref 100–199)
HDL: 41 mg/dL (ref >39)
LDL Chol Calc (NIH): 71 mg/dL (ref 0–99)
Triglycerides: 189 mg/dL — ABNORMAL HIGH (ref 0–149)
VLDL Cholesterol Cal: 32 mg/dL (ref 5–40)

## 2024-04-26 LAB — IRON,TIBC AND FERRITIN PANEL
Ferritin: 59 ng/mL (ref 15–150)
Iron Saturation: 41 % (ref 15–55)
Iron: 121 ug/dL (ref 27–139)
Total Iron Binding Capacity: 293 ug/dL (ref 250–450)
UIBC: 172 ug/dL (ref 118–369)

## 2024-04-26 LAB — HEMOGLOBIN A1C
Est. average glucose Bld gHb Est-mCnc: 128 mg/dL
Hgb A1c MFr Bld: 6.1 % — ABNORMAL HIGH (ref 4.8–5.6)

## 2024-04-26 LAB — VITAMIN B12: Vitamin B-12: 883 pg/mL (ref 232–1245)

## 2024-04-26 LAB — TSH: TSH: 0.842 u[IU]/mL (ref 0.450–4.500)

## 2024-04-26 LAB — VITAMIN D 25 HYDROXY (VIT D DEFICIENCY, FRACTURES): Vit D, 25-Hydroxy: 33.3 ng/mL (ref 30.0–100.0)

## 2024-04-28 ENCOUNTER — Other Ambulatory Visit: Payer: Self-pay | Admitting: Oncology

## 2024-05-02 ENCOUNTER — Other Ambulatory Visit: Payer: Self-pay

## 2024-05-02 DIAGNOSIS — I10 Essential (primary) hypertension: Secondary | ICD-10-CM

## 2024-05-02 NOTE — Progress Notes (Signed)
 05/02/2024 Name: Kelsey Young MRN: [id number] DOB: Oct 31, 1931  Chief Complaint  Patient presents with   Medication Management   Hypertension    Kelsey Young is a 89 y.o. year old female who presented for a telephone visit.   They were referred to the pharmacist by their PCP for assistance in managing hypertension and medication access.    Subjective:  Care Team: Primary Care Provider: [name], FNP   Medication Access/Adherence  Current Pharmacy:  [facility] [place], [place] - [address] [place] [address] [place] [address] Phone: [phone] Fax: [phone]  [id number] [place], [place] - [address] [place] [address] Phone: [phone] Fax: [phone]   Patient reports affordability concerns with their medications: Yes  - farxiga/jardiance  Patient reports access/transportation concerns to their pharmacy: No  Patient reports adherence concerns with their medications:  No     Hypertension:  Current medications: Triamterene -hydrochlorothiazide  37.5-25 1/2 tab daily, Losartan  100mg  daily, Diltiazem  180mg , Metoprolol  50mg , Lasix 20mg  Medications previously tried: None  Patient has a validated, automated, upper arm home BP cuff Current blood pressure readings readings: 148/75 HR 73 this morning  10/29 BP when waking 149/89 HR 76 11:00am 134/66 hr 68 10:50P  10/30  130/72 10:30AM hr 76 11:30p 128/68 HR 76  10/31 12:48p 138/77 HR 77 11:24P 119/56 HR 65  11/1 11:29AM 142/73 hr 70 11:32am 139/72 HR 71 1:49am (11/2) bedtime - 119/57 HR 69  11/2 159/102 hr 78 11:55a After a lot of activity, busy 1:09pm 118/62 HR 76  11/325 12:40AM 149/85 HR 75 11:36AM 124/60 hr 70  148/75 HR 73 10:43am  Patient denies hypotensive s/sx including dizziness, lightheadedness.  Patient denies hypertensive symptoms including headache, chest pain, shortness of breath  Morning  readings consistently elevated, patient checking before her BP meds   Objective:  Lab Results  Component Value Date   HGBA1C 6.1 (H) 04/25/2024    Lab Results  Component Value Date   CREATININE 0.75 04/25/2024   BUN 21 04/25/2024   NA 140 04/25/2024   K 3.8 04/25/2024   CL 102 04/25/2024   CO2 23 04/25/2024    Lab Results  Component Value Date   CHOL 144 04/25/2024   HDL 41 04/25/2024   LDLCALC 71 04/25/2024   TRIG 189 (H) 04/25/2024   CHOLHDL 3.5 04/25/2024    Medications Reviewed Today     Reviewed by [name], RPH (Pharmacist) on 05/02/24 at 2009  Med List Status: <None>   Medication Order Taking? Sig Documenting Provider Last Dose Status Informant  acetaminophen  (TYLENOL ) 500 MG tablet [id number]  Take 500 mg by mouth every 8 (eight) hours as needed (takes only as needed). [provider]  Active   alendronate  (FOSAMAX ) 70 MG tablet [id number]  TAKE 1 TABLET BY MOUTH ONCE A WEEK. TAKE WITH A FULL GLASS OF WATER ON AN EMPTY STOMACH. Jacobo Evalene PARAS, MD  Active   aspirin  EC 81 MG EC tablet [id number]  Take 1 tablet (81 mg total) by mouth daily. Vaickute, Rima, MD  Active Self  atorvastatin  (LIPITOR) 20 MG tablet [id number]  Take 1 tablet (20 mg total) by mouth daily. [name], FNP  Active   Blood Glucose Monitoring Suppl [facility] VERIO) w/Device PRESSLEY [id number]  Use as needed up to twice daily to check blood sugar for verification when Libre reads low or high [name], FNP  Active   Budeson-Glycopyrrol-Formoterol  (  BREZTRI  AEROSPHERE) 160-9-4.8 MCG/ACT AERO [id number]  Inhale 2 puffs into the lungs in the morning and at bedtime. [name], FNP  Active   Coenzyme Q10 (CO Q 10 PO) [id number]  Take 100 mg by mouth daily.  [provider]  Active Self  Continuous Glucose Receiver (FREESTYLE LIBRE 3 READER) DEVI [id number]  USE AS DIRECTED AS NEEDED [name], FNP  Active   Continuous Glucose Sensor (FREESTYLE LIBRE 3 PLUS  SENSOR) MISC [id number]  Change sensor every 15 days. [name], FNP  Active   dapagliflozin propanediol (FARXIGA) 5 MG TABS tablet [id number]  Take 5 mg by mouth daily. [provider]  Active   diltiazem  (CARDIZEM  CD) 180 MG 24 hr capsule [id number]  Take 180 mg by mouth daily. [provider]  Active   docusate sodium (COLACE) 100 MG capsule [id number]  Take 200 mg by mouth 2 (two) times daily. [provider]  Active   econazole nitrate 1 % cream [id number]  Use 1 % CREAM [provider]  Active   fluticasone  (FLONASE ) 50 MCG/ACT nasal spray [id number]  Place 1 spray into both nostrils daily. [name], FNP  Active   furosemide (LASIX) 20 MG tablet [id number]  Take 20 mg by mouth daily. [provider]  Active   glucose blood (ONETOUCH VERIO) test strip [id number]  Use as needed up to twice daily to check blood sugar for verification when Libre reads low or high. [name], FNP  Active   isosorbide  mononitrate (IMDUR ) 60 MG 24 hr tablet [id number]  Take 60 mg by mouth daily. [provider]  Active   levothyroxine  (SYNTHROID ) 50 MCG tablet [id number]  Take 1 tablet (50 mcg total) by mouth daily. [name], FNP  Active   losartan  (COZAAR ) 100 MG tablet [id number]  Take 100 mg by mouth daily. [provider]  Active   metFORMIN (GLUCOPHAGE) 500 MG tablet [id number]  Take 0.5 tablets by mouth 2 (two) times daily with a meal. [provider]  Active   metoprolol  succinate (TOPROL -XL) 50 MG 24 hr tablet [id number]  Take 1 tablet by mouth daily. [provider]  Active   omeprazole (PRILOSEC) 20 MG capsule [id number]  Take 20 mg by mouth daily as needed (for heartburn).  [provider]  Active Self  triamcinolone  cream (KENALOG ) 0.1 % [id number]  Apply 1 Application topically 2 (two) times daily. [name], FNP  Active   triamterene -hydrochlorothiazide  (MAXZIDE -25) 37.5-25 MG tablet  [id number]  Take 0.5 tablets by mouth daily. [name], FNP  Active               Assessment/Plan:   Hypertension: - Currently uncontrolled - Reviewed long term cardiovascular and renal outcomes of uncontrolled blood pressure - Reviewed appropriate blood pressure monitoring technique and reviewed goal blood pressure. Recommended to check home blood pressure and heart rate twice daily. Counseled to check morning BP AFTER morning medication.  - Recommend to continue med therapy    Med Access: -Farxiga app was denied due to patient exceeding income limit. [place] is showing higher than amount listed on tax return per patient. -Discussed with patient, she will bring by a copy of her tax return to submit to company to appeal denial  Follow Up Plan: 1 week  Jon VEAR Lindau, PharmD Clinical Pharmacist [phone]

## 2024-05-04 DIAGNOSIS — L244 Irritant contact dermatitis due to drugs in contact with skin: Secondary | ICD-10-CM | POA: Diagnosis not present

## 2024-05-09 ENCOUNTER — Other Ambulatory Visit: Payer: Self-pay

## 2024-05-09 DIAGNOSIS — I10 Essential (primary) hypertension: Secondary | ICD-10-CM

## 2024-05-09 NOTE — Progress Notes (Signed)
 05/09/2024 Name: Kelsey Young MRN: [id number] DOB: 1931/09/23  Chief Complaint  Patient presents with   Medication Management   Diabetes   Hypertension    Kelsey Young is a 89 y.o. year old female who presented for a telephone visit.   They were referred to the pharmacist by their PCP for assistance in managing hypertension and medication access.    Subjective:  Care Team: Primary Care Provider: [name], FNP   Medication Access/Adherence  Current Pharmacy:  [facility] [place], [place] - [address] [place] [address] [place] [address] Phone: [phone] Fax: [phone]  [id number] - [place], [place] - [address] [place] [address] Phone: [phone] Fax: [phone]   Patient reports affordability concerns with their medications: Yes  - farxiga/jardiance  Patient reports access/transportation concerns to their pharmacy: No  Patient reports adherence concerns with their medications:  No     Hypertension:  Current medications: Triamterene -hydrochlorothiazide  37.5-25 1/2 tab daily, Losartan  100mg  daily, Diltiazem  180mg , Metoprolol  50mg , Lasix 20mg  Medications previously tried: None  Patient has a validated, automated, upper arm home BP cuff Current blood pressure readings readings:   05/02/24  11:21pm 123/64 HR 67  05/03/24 11:16am 147/75 HR 73 11:14PM 115/54 HR 67  05/04/24 8:50am 136/69 HR 77 10:55PM 129/63 HR 69  05/05/24 11:45aM 139/63 HR 75 10:15 116/54 hr 69  05/06/24 133/72 HR 70 2:30p 11:42pm 147/102 HR 77 went to restroom 12:20am 143/82 HR 70  05/07/24 Two low BG in the 60s, took glucose tabs 12:25pm 138/67 HR 75 3:22PM 138/60 hr 77 7:59pm 129/70 HR 76 11:32PM 144/71 HR 74  05/08/24 Low BG two alarms in the 60s 12:30p 129/63 HR 75 3 BMs during the day, felt shaky and breathy Had chinese for dinner, forgot BP  05/09/24 129/63 HR 78  Patient  denies hypotensive s/sx including dizziness, lightheadedness.  Patient denies hypertensive symptoms including headache, chest pain, shortness of breath  Does report this weekend she did a lot of walking and thinks she may have over-exerted herself, felt breathy and feels almost like she pulled something in her chest, does NOT feel like chest pain. Feels this has slowly begun to improve since Saturday.  Morning readings have improved since checking after her BP meds in the morning     Objective:  Lab Results  Component Value Date   HGBA1C 6.1 (H) 04/25/2024    Lab Results  Component Value Date   CREATININE 0.75 04/25/2024   BUN 21 04/25/2024   NA 140 04/25/2024   K 3.8 04/25/2024   CL 102 04/25/2024   CO2 23 04/25/2024    Lab Results  Component Value Date   CHOL 144 04/25/2024   HDL 41 04/25/2024   LDLCALC 71 04/25/2024   TRIG 189 (H) 04/25/2024   CHOLHDL 3.5 04/25/2024    Medications Reviewed Today     Reviewed by [name], RPH (Pharmacist) on 05/09/24 at 1430  Med List Status: <None>   Medication Order Taking? Sig Documenting Provider Last Dose Status Informant  acetaminophen  (TYLENOL ) 500 MG tablet [id number] Yes Take 500 mg by mouth every 8 (eight) hours as needed (takes only as needed). [provider]  Active   alendronate  (FOSAMAX ) 70 MG tablet [id number] Yes TAKE 1 TABLET BY MOUTH ONCE A WEEK. TAKE WITH A FULL GLASS OF WATER ON AN EMPTY STOMACH. Jacobo Evalene PARAS, MD  Active   aspirin  EC 81 MG EC  tablet [id number] Yes Take 1 tablet (81 mg total) by mouth daily. Vaickute, Rima, MD  Active Self  atorvastatin  (LIPITOR) 20 MG tablet [id number] Yes Take 1 tablet (20 mg total) by mouth daily. [name], FNP  Active   Blood Glucose Monitoring Suppl [facility] VERIO) w/Device PRESSLEY [id number]  Use as needed up to twice daily to check blood sugar for verification when Libre reads low or high [name], FNP  Active    Budeson-Glycopyrrol-Formoterol  (BREZTRI  AEROSPHERE) 160-9-4.8 MCG/ACT TERESE [id number]  Inhale 2 puffs into the lungs in the morning and at bedtime. [name], FNP  Active   Coenzyme Q10 (CO Q 10 PO) [id number] Yes Take 100 mg by mouth daily.  [provider]  Active Self  Continuous Glucose Receiver (FREESTYLE LIBRE 3 READER) DEVI [id number]  USE AS DIRECTED AS NEEDED [name], FNP  Active   Continuous Glucose Sensor (FREESTYLE LIBRE 3 PLUS SENSOR) MISC [id number]  Change sensor every 15 days. [name], FNP  Active   dapagliflozin propanediol (FARXIGA) 5 MG TABS tablet [id number]  Take 5 mg by mouth daily. [provider]  Active   diltiazem  (CARDIZEM  CD) 180 MG 24 hr capsule [id number] Yes Take 180 mg by mouth daily. [provider]  Active   docusate sodium (COLACE) 100 MG capsule [id number] Yes Take 200 mg by mouth 2 (two) times daily. [provider]  Active   econazole nitrate 1 % cream [id number]  Use 1 % CREAM [provider]  Active   fluticasone  (FLONASE ) 50 MCG/ACT nasal spray [id number] Yes Place 1 spray into both nostrils daily. [name], FNP  Active   furosemide (LASIX) 20 MG tablet [id number] Yes Take 20 mg by mouth daily. [provider]  Active   glucose blood (ONETOUCH VERIO) test strip [id number]  Use as needed up to twice daily to check blood sugar for verification when Libre reads low or high. [name], FNP  Active   isosorbide  mononitrate (IMDUR ) 60 MG 24 hr tablet [id number] Yes Take 60 mg by mouth daily. [provider]  Active   levothyroxine  (SYNTHROID ) 50 MCG tablet [id number] Yes Take 1 tablet (50 mcg total) by mouth daily. [name], FNP  Active   losartan  (COZAAR ) 100 MG tablet [id number] Yes Take 100 mg by mouth daily. [provider]  Active   metFORMIN (GLUCOPHAGE) 500 MG tablet [id number]  Take 0.5 tablets by mouth 2 (two) times daily with a meal.  Patient not  taking: Reported on 05/09/2024   [provider]  Active   metoprolol  succinate (TOPROL -XL) 50 MG 24 hr tablet [id number] Yes Take 1 tablet by mouth daily. [provider]  Active   omeprazole (PRILOSEC) 20 MG capsule [id number] Yes Take 20 mg by mouth daily as needed (for heartburn).  [provider]  Active Self  triamcinolone  cream (KENALOG ) 0.1 % [id number]  Apply 1 Application topically 2 (two) times daily. [name], FNP  Active   triamterene -hydrochlorothiazide  (MAXZIDE -25) 37.5-25 MG tablet [id number] Yes Take 0.5 tablets by mouth daily. [name], FNP  Active               Assessment/Plan:   Hypertension: - Currently uncontrolled - Reviewed long term cardiovascular and renal outcomes of uncontrolled blood pressure - Reviewed appropriate blood pressure monitoring technique and reviewed goal blood pressure. Recommended to check home blood pressure and heart rate twice daily.  - Recommend to continue med therapy  -  Contact office for sooner follow up if you feel that breathiness is not resolving   Med Access: -Doreen app was denied due to patient exceeding income limit. [place] income check is showing higher than amount listed on tax return per patient. -Submitting proof of income to company that patient brought in  Follow Up Plan: 1 week  Jon VEAR Lindau, PharmD Clinical Pharmacist [phone]

## 2024-05-12 ENCOUNTER — Telehealth: Payer: Self-pay

## 2024-05-12 NOTE — Progress Notes (Signed)
   05/12/2024  Patient ID: Kelsey Young, female   DOB: [birth year], 89 y.o.   MRN: [id number]  Patient called in to report consistent low BG levels in the 60s and one as low as 57 every night this week. Notified PCP and discussed.  Instructed patient to hold Jardiance  to see if this resolves the low BG. F/u on Tues  Kelsey Young, PharmD Clinical Pharmacist [phone]

## 2024-05-16 ENCOUNTER — Other Ambulatory Visit: Payer: Self-pay

## 2024-05-16 DIAGNOSIS — I10 Essential (primary) hypertension: Secondary | ICD-10-CM

## 2024-05-16 NOTE — Progress Notes (Signed)
 05/16/2024 Name: Kelsey Young MRN: [id number] DOB: 01-09-1932  Chief Complaint  Patient presents with   Medication Management   Diabetes   Hypertension    Kelsey Young is a 89 y.o. year old female who presented for a telephone visit.   They were referred to the pharmacist by their PCP for assistance in managing hypertension and medication access.    Subjective:  Care Team: Primary Care Provider: [name], FNP   Medication Access/Adherence  Current Pharmacy:  [facility] [place], [place] - [address] [place] [address] [place] [address] Phone: [phone] Fax: [phone]  [id number] - [place], [place] - [address] [place] [address] Phone: [phone] Fax: [phone]   Patient reports affordability concerns with their medications: Yes  - farxiga/jardiance  Patient reports access/transportation concerns to their pharmacy: No  Patient reports adherence concerns with their medications:  No     Hypertension:  Current medications: Triamterene -hydrochlorothiazide  37.5-25 1/2 tab daily, Losartan  100mg  daily, Diltiazem  180mg , Metoprolol  50mg , Lasix 20mg  Medications previously tried: None  Patient has a validated, automated, upper arm home BP cuff Current blood pressure readings readings:   05/16/24 1:37pm 126/68 HR 75 10:15am 150/73 HR 70  05/15/24 12:30pm 140/63 HR 69 10:33 AM 119/62 HR 64 12:23 am (so early 05/16/24) 126/65  05/14/24 1:46am 135/65 HR 76 4P 131/67 hr 74 9:23pm 124/65 HR 73  05/13/24 6:22pm 131/64 HR 75   Patient denies hypotensive s/sx including dizziness, lightheadedness.  Patient denies hypertensive symptoms including headache, chest pain, shortness of breath  Morning readings have improved since checking after her BP meds in the morning   Diabetes:  Current medications:  Jardiance  25mg  1/2 tab daily, Farxiga PAP pending to  replace  Patient has been HOLDING since Sunday morning to address low BG over night.  Had low BG alarm Sunday evening (early Monday morning) for 69, none last night  Denies any high BG since holding jardiance       Objective:  Lab Results  Component Value Date   HGBA1C 6.1 (H) 04/25/2024    Lab Results  Component Value Date   CREATININE 0.75 04/25/2024   BUN 21 04/25/2024   NA 140 04/25/2024   K 3.8 04/25/2024   CL 102 04/25/2024   CO2 23 04/25/2024    Lab Results  Component Value Date   CHOL 144 04/25/2024   HDL 41 04/25/2024   LDLCALC 71 04/25/2024   TRIG 189 (H) 04/25/2024   CHOLHDL 3.5 04/25/2024    Medications Reviewed Today     Reviewed by [name], RPH (Pharmacist) on 05/16/24 at 1533  Med List Status: <None>   Medication Order Taking? Sig Documenting Provider Last Dose Status Informant  acetaminophen  (TYLENOL ) 500 MG tablet [id number]  Take 500 mg by mouth every 8 (eight) hours as needed (takes only as needed). [provider]  Active   alendronate  (FOSAMAX ) 70 MG tablet [id number]  TAKE 1 TABLET BY MOUTH ONCE A WEEK. TAKE WITH A FULL GLASS OF WATER ON AN EMPTY STOMACH. Jacobo Evalene PARAS, MD  Active   aspirin  EC 81 MG EC tablet [id number]  Take 1 tablet (81 mg total) by mouth daily. Vaickute, Rima, MD  Active Self  atorvastatin  (LIPITOR) 20 MG tablet [id number]  Take 1 tablet (20 mg total) by mouth daily. [name], FNP  Active   Blood Glucose Monitoring Suppl [facility] VERIO) w/Device PRESSLEY [id number]  Use as needed  up to twice daily to check blood sugar for verification when [place] reads low or high [name], FNP  Active   Budeson-Glycopyrrol-Formoterol  (BREZTRI  AEROSPHERE) 160-9-4.8 MCG/ACT TERESE [id number]  Inhale 2 puffs into the lungs in the morning and at bedtime. [name], FNP  Active   Coenzyme Q10 (CO Q 10 PO) [id number]  Take 100 mg by mouth daily.  [provider]  Active Self  Continuous Glucose Receiver  (FREESTYLE LIBRE 3 READER) DEVI [id number]  USE AS DIRECTED AS NEEDED [name], FNP  Active   Continuous Glucose Sensor (FREESTYLE LIBRE 3 PLUS SENSOR) MISC [id number]  Change sensor every 15 days. [name], FNP  Active   dapagliflozin propanediol (FARXIGA) 5 MG TABS tablet [id number]  Take 5 mg by mouth daily. [provider]  Active   diltiazem  (CARDIZEM  CD) 180 MG 24 hr capsule [id number]  Take 180 mg by mouth daily. [provider]  Active   docusate sodium (COLACE) 100 MG capsule [id number]  Take 200 mg by mouth 2 (two) times daily. [provider]  Active   econazole nitrate 1 % cream [id number]  Use 1 % CREAM [provider]  Active   fluticasone  (FLONASE ) 50 MCG/ACT nasal spray [id number]  Place 1 spray into both nostrils daily. [name], FNP  Active   furosemide (LASIX) 20 MG tablet [id number]  Take 20 mg by mouth daily. [provider]  Active   glucose blood (ONETOUCH VERIO) test strip [id number]  Use as needed up to twice daily to check blood sugar for verification when Libre reads low or high. [name], FNP  Active   isosorbide  mononitrate (IMDUR ) 60 MG 24 hr tablet [id number]  Take 60 mg by mouth daily. [provider]  Active   levothyroxine  (SYNTHROID ) 50 MCG tablet [id number]  Take 1 tablet (50 mcg total) by mouth daily. [name], FNP  Active   losartan  (COZAAR ) 100 MG tablet [id number]  Take 100 mg by mouth daily. [provider]  Active   metFORMIN (GLUCOPHAGE) 500 MG tablet [id number]  Take 0.5 tablets by mouth 2 (two) times daily with a meal.  Patient not taking: Reported on 05/09/2024   [provider]  Active   metoprolol  succinate (TOPROL -XL) 50 MG 24 hr tablet [id number]  Take 1 tablet by mouth daily. [provider]  Active   omeprazole (PRILOSEC) 20 MG capsule [id number]  Take 20 mg by mouth daily as needed (for heartburn).  [provider]  Active Self   triamcinolone  cream (KENALOG ) 0.1 % [id number]  Apply 1 Application topically 2 (two) times daily. [name], FNP  Active   triamterene -hydrochlorothiazide  (MAXZIDE -25) 37.5-25 MG tablet [id number]  Take 0.5 tablets by mouth daily. [name], FNP  Active               Assessment/Plan:   Hypertension: - Currently uncontrolled but reporting controlled readings at home - Reviewed long term cardiovascular and renal outcomes of uncontrolled blood pressure - Reviewed appropriate blood pressure monitoring technique and reviewed goal blood pressure. Recommended to check home blood pressure and heart rate twice daily.  - Recommend to continue med therapy   Diabetes: -Continue to hold jardiance     Follow Up Plan: 1 week  Jon VEAR Lindau, PharmD Clinical Pharmacist [phone]

## 2024-05-22 DIAGNOSIS — B351 Tinea unguium: Secondary | ICD-10-CM | POA: Diagnosis not present

## 2024-05-22 DIAGNOSIS — E1142 Type 2 diabetes mellitus with diabetic polyneuropathy: Secondary | ICD-10-CM | POA: Diagnosis not present

## 2024-05-23 ENCOUNTER — Other Ambulatory Visit: Payer: Self-pay

## 2024-05-23 DIAGNOSIS — I10 Essential (primary) hypertension: Secondary | ICD-10-CM

## 2024-05-23 NOTE — Progress Notes (Signed)
 05/23/2024 Name: Kelsey Young MRN: [id number] DOB: 1932-06-21  Chief Complaint  Patient presents with   Medication Management   Diabetes   Hypertension    Kelsey Young is a 89 y.o. year old female who presented for a telephone visit.   They were referred to the pharmacist by their PCP for assistance in managing hypertension and medication access.    Subjective:  Care Team: Primary Care Provider: [name], FNP   Medication Access/Adherence  Current Pharmacy:  [facility] [place], [place] - [address] [place] [address] [place] [address] Phone: [phone] Fax: [phone]  [id number] - [place], [place] - [address] [place] [address] Phone: [phone] Fax: [phone]   Patient reports affordability concerns with their medications: Yes  - farxiga/jardiance  Patient reports access/transportation concerns to their pharmacy: No  Patient reports adherence concerns with their medications:  No     Hypertension:  Current medications: Triamterene -hydrochlorothiazide  37.5-25 1/2 tab daily, Losartan  100mg  daily, Diltiazem  180mg , Metoprolol  50mg , Lasix 20mg  Medications previously tried: None  Patient has a validated, automated, upper arm home BP cuff Current blood pressure readings readings:   Tuesday 11/18 150/73 HR 70 10:15 am  8:04 pm 138/65 HR 70 11:49PM 150/72 HR 72  Wed 11/19 No low BG 12:02pm 137/69 HR 71 BG 169 1:21 pm post-prandial 12:57 am (basically 11/20) 134/60 HR 70  thurs11/20/25 No low BG BP 143/67 HR 70 10:45AM 9:36PM 132/62 HR 69 11:46PM 140/73 hr 74  Fri 05/19/24 No low bg BP 139/81 HR 84 12:15pm 12:18pm 93/75 HR 77 Bg 142 7:10pm BP 119/56 HR 66 12:30am BG 156 12:30am (basically 05/20/24)  Sat 05/20/24 No low BG 11:14am 118 BP 142/79 HR 77 12:30pm 2:09 am (basically 05/21/24) 150/77 HR 64 140/73 hr 64 2:17am  Sun 05/21/24 Had omelet and  bacon BP 2:31pm 125/65 HR 68 BP 4:50pm 137/63 HR 72 BP 12:35AM (BASICALLY 11/24) 139/64 HR 65  05/22/24 Mon No low BG  BP 128/65 hr 77 noon Monday night 11:48pm 129/62 HR 69  Tues  No low BG BP 144/78 HR 76 11:44am BP 11:52 am 142/76 HR 72 12:09pm 148/78      Diabetes:  Current medications:  Jardiance  25mg  1/2 tab daily, Farxiga PAP pending to replace  Patient has been HOLDING due to low BG Low BG has resolved since holding    Objective:  Lab Results  Component Value Date   HGBA1C 6.1 (H) 04/25/2024    Lab Results  Component Value Date   CREATININE 0.75 04/25/2024   BUN 21 04/25/2024   NA 140 04/25/2024   K 3.8 04/25/2024   CL 102 04/25/2024   CO2 23 04/25/2024    Lab Results  Component Value Date   CHOL 144 04/25/2024   HDL 41 04/25/2024   LDLCALC 71 04/25/2024   TRIG 189 (H) 04/25/2024   CHOLHDL 3.5 04/25/2024    Medications Reviewed Today     Reviewed by [name], RPH (Pharmacist) on 05/23/24 at 1459  Med List Status: <None>   Medication Order Taking? Sig Documenting Provider Last Dose Status Informant  acetaminophen  (TYLENOL ) 500 MG tablet [id number]  Take 500 mg by mouth every 8 (eight) hours as needed (takes only as needed). [provider]  Active   alendronate  (FOSAMAX ) 70 MG tablet [id number]  TAKE 1 TABLET BY MOUTH ONCE A WEEK. TAKE WITH A FULL GLASS OF WATER ON AN EMPTY STOMACH. Jacobo,  Evalene PARAS, MD  Active   aspirin  EC 81 MG EC tablet [id number]  Take 1 tablet (81 mg total) by mouth daily. Vaickute, Rima, MD  Active Self  atorvastatin  (LIPITOR) 20 MG tablet [id number]  Take 1 tablet (20 mg total) by mouth daily. [name], FNP  Active   Blood Glucose Monitoring Suppl [facility] VERIO) w/Device PRESSLEY [id number]  Use as needed up to twice daily to check blood sugar for verification when Libre reads low or high [name], FNP  Active   Budeson-Glycopyrrol-Formoterol  (BREZTRI  AEROSPHERE) 160-9-4.8 MCG/ACT TERESE  [id number]  Inhale 2 puffs into the lungs in the morning and at bedtime. [name], FNP  Active   Coenzyme Q10 (CO Q 10 PO) [id number]  Take 100 mg by mouth daily.  [provider]  Active Self  Continuous Glucose Receiver (FREESTYLE LIBRE 3 READER) DEVI [id number]  USE AS DIRECTED AS NEEDED [name], FNP  Active   Continuous Glucose Sensor (FREESTYLE LIBRE 3 PLUS SENSOR) MISC [id number]  Change sensor every 15 days. [name], FNP  Active   dapagliflozin propanediol (FARXIGA) 5 MG TABS tablet [id number]  Take 5 mg by mouth daily. [provider]  Active   diltiazem  (CARDIZEM  CD) 180 MG 24 hr capsule [id number]  Take 180 mg by mouth daily. [provider]  Active   docusate sodium (COLACE) 100 MG capsule [id number]  Take 200 mg by mouth 2 (two) times daily. [provider]  Active   econazole nitrate 1 % cream [id number]  Use 1 % CREAM [provider]  Active   fluticasone  (FLONASE ) 50 MCG/ACT nasal spray [id number]  Place 1 spray into both nostrils daily. [name], FNP  Active   furosemide (LASIX) 20 MG tablet [id number]  Take 20 mg by mouth daily. [provider]  Active   glucose blood (ONETOUCH VERIO) test strip [id number]  Use as needed up to twice daily to check blood sugar for verification when Libre reads low or high. [name], FNP  Active   isosorbide  mononitrate (IMDUR ) 60 MG 24 hr tablet [id number]  Take 60 mg by mouth daily. [provider]  Active   levothyroxine  (SYNTHROID ) 50 MCG tablet [id number]  Take 1 tablet (50 mcg total) by mouth daily. [name], FNP  Active   losartan  (COZAAR ) 100 MG tablet [id number]  Take 100 mg by mouth daily. [provider]  Active   metFORMIN (GLUCOPHAGE) 500 MG tablet [id number]  Take 0.5 tablets by mouth 2 (two) times daily with a meal.  Patient not taking: Reported on 05/09/2024   [provider]  Active   metoprolol  succinate  (TOPROL -XL) 50 MG 24 hr tablet [id number]  Take 1 tablet by mouth daily. [provider]  Active   omeprazole (PRILOSEC) 20 MG capsule [id number]  Take 20 mg by mouth daily as needed (for heartburn).  [provider]  Active Self  triamcinolone  cream (KENALOG ) 0.1 % [id number]  Apply 1 Application topically 2 (two) times daily. [name], FNP  Active   triamterene -hydrochlorothiazide  (MAXZIDE -25) 37.5-25 MG tablet [id number]  Take 0.5 tablets by mouth daily. [name], FNP  Active               Assessment/Plan:   Hypertension: - Currently uncontrolled but reporting controlled readings at home - Reviewed long term cardiovascular and renal outcomes of uncontrolled blood pressure - Reviewed appropriate blood pressure monitoring technique and reviewed goal blood pressure. Recommended  to check home blood pressure and heart rate twice daily.  - Recommend to continue med therapy   Diabetes: -Continue to hold jardiance . -Counseled that splitting the tablet may have resulted uneven glucose control and contributed to lows. -Continue to pursue Farxiga 5mg  tablet PAP through [employer] for CKD protection, informed patient we need her 1040 tax form to continue    Follow Up Plan: 3-4 weeks  Jon VEAR [place], PharmD Clinical Pharmacist [phone]

## 2024-05-29 ENCOUNTER — Telehealth: Payer: Self-pay | Admitting: Oncology

## 2024-05-29 NOTE — Telephone Encounter (Signed)
 Pt left vm to r/s MD appt from 12/5 to 12/8.   I called and spoke with pt and confirmed the new MD appt date and time

## 2024-05-31 ENCOUNTER — Ambulatory Visit
Admission: RE | Admit: 2024-05-31 | Discharge: 2024-05-31 | Disposition: A | Source: Ambulatory Visit | Attending: Oncology | Admitting: Oncology

## 2024-05-31 DIAGNOSIS — Z1231 Encounter for screening mammogram for malignant neoplasm of breast: Secondary | ICD-10-CM | POA: Diagnosis not present

## 2024-06-02 ENCOUNTER — Ambulatory Visit: Payer: PPO | Admitting: Oncology

## 2024-06-05 ENCOUNTER — Inpatient Hospital Stay: Attending: Oncology | Admitting: Oncology

## 2024-06-05 ENCOUNTER — Encounter: Payer: Self-pay | Admitting: Oncology

## 2024-06-05 VITALS — BP 133/66 | HR 93 | Temp 97.8°F | Resp 18 | Ht 59.0 in | Wt 174.0 lb

## 2024-06-05 DIAGNOSIS — M810 Age-related osteoporosis without current pathological fracture: Secondary | ICD-10-CM | POA: Diagnosis not present

## 2024-06-05 DIAGNOSIS — Z87891 Personal history of nicotine dependence: Secondary | ICD-10-CM | POA: Diagnosis not present

## 2024-06-05 DIAGNOSIS — I10 Essential (primary) hypertension: Secondary | ICD-10-CM | POA: Insufficient documentation

## 2024-06-05 DIAGNOSIS — Z1732 Human epidermal growth factor receptor 2 negative status: Secondary | ICD-10-CM | POA: Insufficient documentation

## 2024-06-05 DIAGNOSIS — Z1722 Progesterone receptor negative status: Secondary | ICD-10-CM | POA: Diagnosis not present

## 2024-06-05 DIAGNOSIS — M549 Dorsalgia, unspecified: Secondary | ICD-10-CM | POA: Diagnosis not present

## 2024-06-05 DIAGNOSIS — C50412 Malignant neoplasm of upper-outer quadrant of left female breast: Secondary | ICD-10-CM | POA: Insufficient documentation

## 2024-06-05 DIAGNOSIS — Z809 Family history of malignant neoplasm, unspecified: Secondary | ICD-10-CM | POA: Diagnosis not present

## 2024-06-05 DIAGNOSIS — Z808 Family history of malignant neoplasm of other organs or systems: Secondary | ICD-10-CM | POA: Diagnosis not present

## 2024-06-05 DIAGNOSIS — Z170 Estrogen receptor positive status [ER+]: Secondary | ICD-10-CM | POA: Insufficient documentation

## 2024-06-05 DIAGNOSIS — Z79899 Other long term (current) drug therapy: Secondary | ICD-10-CM | POA: Diagnosis not present

## 2024-06-05 DIAGNOSIS — G8929 Other chronic pain: Secondary | ICD-10-CM | POA: Insufficient documentation

## 2024-06-05 NOTE — Progress Notes (Unsigned)
 [facility]  [phone] [phone]  ID: Kelsey Young OB: 1931-11-18  MR#: [id number]  RDW#:[id number]  Patient Care Team: [name], FNP as PCP - General (Family Medicine) Jacobo Evalene PARAS, MD as Consulting Physician (Oncology) [name], [facility] (Pharmacist)  CHIEF COMPLAINT: Pathologic stage Ia ER positive, PR/HER-2 negative invasive carcinoma of the upper outer quadrant of the left breast.  INTERVAL HISTORY: Patient returns to clinic today for yearly evaluation and discussion of her mammogram results.  She continues to have chronic pain as well as multiple other chronic medical complaints. She has no neurologic complaints.  She has had no recent fevers or illnesses.  She has no chest pain, shortness of breath, cough, or hemoptysis.  She denies any nausea, vomiting constipation, diarrhea.  She has no urinary complaints.  Patient offers no further specific complaints today.  REVIEW OF SYSTEMS:   Review of Systems  Constitutional:  Positive for malaise/fatigue. Negative for fever and weight loss.  HENT: Negative.  Negative for sore throat.   Respiratory:  Negative for cough and shortness of breath.   Cardiovascular: Negative.  Negative for chest pain and leg swelling.  Gastrointestinal: Negative.  Negative for abdominal pain, blood in stool, constipation, diarrhea, heartburn, melena, nausea and vomiting.  Genitourinary: Negative.  Negative for dysuria.  Musculoskeletal:  Positive for back pain and joint pain.  Skin: Negative.  Negative for rash.  Neurological: Negative.  Negative for dizziness, sensory change, focal weakness, weakness and headaches.  Psychiatric/Behavioral: Negative.  The patient is not nervous/anxious.     As per HPI. Otherwise, a complete review of systems is negative.  PAST MEDICAL HISTORY: Past Medical History:  Diagnosis Date   Breast cancer (HCC)    Cancer (HCC) 03/2017   left breast   Chest pain,  rule out acute myocardial infarction 11/23/2016   Complication of anesthesia    has been slow to wake up   COPD (chronic obstructive pulmonary disease) (HCC)    Diabetes mellitus without complication (HCC)    Dyspnea    Edema leg 03/2017   bilateral swelling   Family history of adverse reaction to anesthesia    son had surgery and heart stopped during procedure   GERD (gastroesophageal reflux disease)    Heart murmur    no treatment   Hemorrhoids 03/2017   History of kidney stones    had eswl   Hyperlipidemia    Hypertension    Hyponatremia 11/25/2016   Hypothyroidism    Lumbar stenosis    Nausea 08/23/2019   Palpitations 11/25/2016   Personal history of radiation therapy 2018   LEFT lumpectomy   Skin cancer 2016,2017,2018   face, back of neck, shoulder, leg, hand   Sleep apnea    does not and will not use a cpap   Thyroid  disease     PAST SURGICAL HISTORY: Past Surgical History:  Procedure Laterality Date   ABDOMINAL HYSTERECTOMY  1982   APPENDECTOMY  1940   BREAST BIOPSY Left 04/09/2017   [facility]   BREAST LUMPECTOMY Left 04/30/2017   [facility], clear margins, negative LN   BREAST LUMPECTOMY WITH NEEDLE LOCALIZATION AND AXILLARY SENTINEL LYMPH NODE BX Left 04/30/2017   Procedure: LEFT BREAST LUMPECTOMY WITH NEEDLE LOCALIZATION AND LEFT AXILLARY SENTINEL LYMPH NODE BX;  Surgeon: Gladis Cough, MD;  Location: [facility] ORS;  Service: General;  Laterality: Left;   CHOLECYSTECTOMY     EYE SURGERY Bilateral 2003   cataract extraction   implants  2003  dental, not removable   JOINT REPLACEMENT Bilateral 2003,2012   total hip replacements   JOINT REPLACEMENT Right 2014   right total knee replacement   SKIN CANCER EXCISION     left hand and right lower leg   THYROIDECTOMY, PARTIAL  2003   TONSILLECTOMY      FAMILY HISTORY: Family History  Problem Relation Age of Onset   Heart failure Mother    Heart attack Father    Diabetes Brother    Heart disease Brother    Melanoma  Brother    Cancer Maternal Uncle    Diabetes Paternal Aunt    Heart disease Paternal Aunt    Heart disease Paternal Grandmother    Diabetes Maternal Aunt    Heart attack Maternal Aunt    Diabetes Maternal Aunt    Diabetes Paternal Aunt    Heart disease Paternal Aunt    Breast cancer Neg Hx     ADVANCED DIRECTIVES (Y/N):  N  HEALTH MAINTENANCE: Social History   Tobacco Use   Smoking status: Former    Current packs/day: 0.00    Types: Cigarettes    Quit date: 06/29/1985    Years since quitting: 38.9   Smokeless tobacco: Never  Vaping Use   Vaping status: Never Used  Substance Use Topics   Alcohol use: Yes    Alcohol/week: 1.0 - 2.0 standard drink of alcohol    Types: 1 - 2 Standard drinks or equivalent per week    Comment: wine 3 times a week   Drug use: No     Colonoscopy:  PAP:  Bone density:  Lipid panel:  Allergies  Allergen Reactions   Elemental Sulfur  Hives and Shortness Of Breath   Iodinated Contrast Media Hives, Other (See Comments) and Shortness Of Breath    Restless. Difficulty breathing. Topical betadine okay  Restless  Restless  Restless. Difficulty breathing. Topical betadine okay    Restless   Codeine Nausea And Vomiting    With high doses, passes out   Morphine Nausea And Vomiting    With high doses, passes out   Oxycodone  Nausea And Vomiting and Other (See Comments)    Dizzy and with high doses, passes out.   Penicillins Other (See Comments)    Hardened lump Has patient had a PCN reaction causing immediate rash, facial/tongue/throat swelling, SOB or lightheadedness with hypotension: No Has patient had a PCN reaction causing severe rash involving mucus membranes or skin necrosis: No Has patient had a PCN reaction that required hospitalization No Has patient had a PCN reaction occurring within the last 10 years: No If all of the above answers are NO, then may proceed with Cephalosporin use.     Sulfa Antibiotics Rash    Current  Outpatient Medications  Medication Sig Dispense Refill   acetaminophen  (TYLENOL ) 500 MG tablet Take 500 mg by mouth every 8 (eight) hours as needed (takes only as needed).     alendronate  (FOSAMAX ) 70 MG tablet TAKE 1 TABLET BY MOUTH ONCE A WEEK. TAKE WITH A FULL GLASS OF WATER ON AN EMPTY STOMACH. 12 tablet 1   aspirin  EC 81 MG EC tablet Take 1 tablet (81 mg total) by mouth daily. 30 tablet 3   atorvastatin  (LIPITOR) 20 MG tablet Take 1 tablet (20 mg total) by mouth daily. 10 tablet 0   Blood Glucose Monitoring Suppl (ONETOUCH VERIO) w/Device KIT Use as needed up to twice daily to check blood sugar for verification when Libre reads low or high 1  kit 0   Coenzyme Q10 (CO Q 10 PO) Take 100 mg by mouth daily.      Continuous Glucose Receiver (FREESTYLE LIBRE 3 READER) DEVI USE AS DIRECTED AS NEEDED 1 each 0   Continuous Glucose Sensor (FREESTYLE LIBRE 3 PLUS SENSOR) MISC Change sensor every 15 days. 2 each 11   diltiazem  (CARDIZEM  CD) 180 MG 24 hr capsule Take 180 mg by mouth daily.     docusate sodium (COLACE) 100 MG capsule Take 200 mg by mouth 2 (two) times daily.     econazole nitrate 1 % cream Use 1 % CREAM     furosemide (LASIX) 20 MG tablet Take 20 mg by mouth daily.     glucose blood (ONETOUCH VERIO) test strip Use as needed up to twice daily to check blood sugar for verification when Libre reads low or high. 100 each 1   isosorbide  mononitrate (IMDUR ) 60 MG 24 hr tablet Take 60 mg by mouth daily.     levothyroxine  (SYNTHROID ) 50 MCG tablet Take 1 tablet (50 mcg total) by mouth daily. 90 tablet 2   losartan  (COZAAR ) 100 MG tablet Take 100 mg by mouth daily.     metoprolol  succinate (TOPROL -XL) 50 MG 24 hr tablet Take 1 tablet by mouth daily.     omeprazole (PRILOSEC) 20 MG capsule Take 20 mg by mouth daily as needed (for heartburn).      triamcinolone  cream (KENALOG ) 0.1 % Apply 1 Application topically 2 (two) times daily. 30 g 0   triamterene -hydrochlorothiazide  (MAXZIDE -25) 37.5-25 MG  tablet Take 0.5 tablets by mouth daily. 45 tablet 3   dapagliflozin propanediol (FARXIGA) 5 MG TABS tablet Take 5 mg by mouth daily. (Patient not taking: Reported on 06/05/2024)     fluticasone  (FLONASE ) 50 MCG/ACT nasal spray Place 1 spray into both nostrils daily. (Patient not taking: Reported on 06/05/2024) 16 g 2   metFORMIN (GLUCOPHAGE) 500 MG tablet Take 0.5 tablets by mouth 2 (two) times daily with a meal. (Patient not taking: Reported on 06/05/2024)     No current facility-administered medications for this visit.    OBJECTIVE: Vitals:   06/05/24 1345  BP: 133/66  Pulse: 93  Resp: 18  Temp: 97.8 F (36.6 C)  SpO2: 95%     Body mass index is 35.14 kg/m.    ECOG FS:1 - Symptomatic but completely ambulatory  General: Well-developed, well-nourished, no acute distress. Eyes: Pink conjunctiva, anicteric sclera. HEENT: Normocephalic, moist mucous membranes. Lungs: No audible wheezing or coughing. Heart: Regular rate and rhythm. Abdomen: Soft, nontender, no obvious distention. Musculoskeletal: No edema, cyanosis, or clubbing. Neuro: Alert, answering all questions appropriately. Cranial nerves grossly intact. Skin: No rashes or petechiae noted. Psych: Normal affect.  LAB RESULTS:  Lab Results  Component Value Date   NA 140 04/25/2024   K 3.8 04/25/2024   CL 102 04/25/2024   CO2 23 04/25/2024   GLUCOSE 119 (H) 04/25/2024   BUN 21 04/25/2024   CREATININE 0.75 04/25/2024   CALCIUM  9.7 04/25/2024   PROT 6.7 04/25/2024   ALBUMIN 4.2 04/25/2024   AST 19 04/25/2024   ALT 18 04/25/2024   ALKPHOS 96 04/25/2024   BILITOT 0.3 04/25/2024   GFRNONAA >60 04/22/2017   GFRAA >60 04/22/2017    Lab Results  Component Value Date   WBC 6.6 04/25/2024   NEUTROABS 4.1 04/25/2024   HGB 14.8 04/25/2024   HCT 44.1 04/25/2024   MCV 93 04/25/2024   PLT 275 04/25/2024     STUDIES:  No results found.  ASSESSMENT: Pathologic stage Ia ER positive, PR/HER-2 negative invasive carcinoma of  the upper outer quadrant of the left breast.  PLAN:    Pathologic stage Ia ER positive, PR/HER-2 negative invasive carcinoma of the upper outer quadrant of the left breast: Given the small size of patient's tumor and her advanced age, it was decided upon that chemotherapy was not necessary and Oncotype DX was not ordered.  Patient completed adjuvant XRT.  Patient discontinued letrozole  secondary to side effects.  She completed 5 years of anastrozole  in July 2024.  Her most recent mammogram on May 31, 2023 was reported as BI-RADS 2.  Her mammogram on May 31, 2024 is pending at time of dictation.  No intervention is needed.  After discussion with the patient, was agreed upon that no further follow-up is necessary and her primary care provider can order her yearly screening mammograms.  No follow-up has been scheduled.  Please refer patient back if there are any questions or concerns.   Osteoporosis: Patient's most recent bone mineral density on May 18, 2023 was reported T-score of -2.7.  Patient was given a prescription for Fosamax  and instructed to continue calcium  and vitamin D  supplementation.  Will defer further management to her primary care physician. Back/joint pain: Chronic and unchanged. Hypertension: Patient's blood pressure is within normal limits today.  Continue monitoring and treatment per primary care.  Patient expressed understanding and was in agreement with this plan. She also understands that She can call clinic at any time with any questions, concerns, or complaints.    Cancer Staging  Primary cancer of upper outer quadrant of left female breast [facility] Staging form: Breast, AJCC 8th Edition - Clinical stage from 04/17/2017: Stage IA (cT1a, cN0, cM0, G2, ER+, PR-, HER2-) - Signed by Jacobo Evalene PARAS, MD on 04/17/2017 Histologic grading system: 3 grade system - Pathologic stage from 05/11/2017: Stage IA (pT1b, pN0, cM0, G2, ER+, PR-, HER2-) - Signed by Jacobo Evalene PARAS, MD on 05/11/2017 Neoadjuvant therapy: No Histologic grading system: 3 grade system Laterality: Left   Evalene PARAS Jacobo, MD   06/06/2024 10:25 PM

## 2024-06-05 NOTE — Progress Notes (Unsigned)
 I called the [place] [facility], and the mammo results were not ready, but should be ready by mid this week, so she would rather the Dr. Call her instead of her having to come back up here unless it is bad news.   The Mammo technician told her that she had to snap two photos of the right breast, and is wondering why.   Ongoing for a while now, there are these scaly looking patches popping up all over her body, mostly on the back of her neck, not itchy. Left breast near under arm is still sore. Under her left arm she has a bad odor and perspire more than the other underarm. She is also anxious about the new heart palpitations and the breathing harder.

## 2024-06-07 ENCOUNTER — Telehealth: Payer: Self-pay | Admitting: *Deleted

## 2024-06-07 NOTE — Telephone Encounter (Signed)
 Call placed to patient as requested, mammogram is normal and patient does not require follow up. Left vm for patient.

## 2024-06-13 ENCOUNTER — Other Ambulatory Visit: Payer: Self-pay

## 2024-06-14 NOTE — Progress Notes (Signed)
 06/14/2024 Name: Kelsey Young MRN: [id number] DOB: February 22, 1932  Chief Complaint  Patient presents with   Medication Management   Diabetes   Hypertension    Kelsey Young is a 89 y.o. year old female who presented for a telephone visit.   They were referred to the pharmacist by their PCP for assistance in managing hypertension and medication access.    Subjective:  Care Team: Primary Care Provider: [name], FNP   Medication Access/Adherence  Current Pharmacy:  [facility] [place], [place] - [address] [place] [address] [place] [address] Phone: [phone] Fax: [phone]  [id number] [place], [place] - [address] [place] [address] Phone: [phone] Fax: [phone]   Patient reports affordability concerns with their medications: Yes  - farxiga/jardiance  Patient reports access/transportation concerns to their pharmacy: No  Patient reports adherence concerns with their medications:  No     Hypertension:  Current medications: Triamterene -hydrochlorothiazide  37.5-25 1/2 tab daily, Losartan  100mg  daily, Diltiazem  180mg , Metoprolol  50mg , Lasix 20mg  Medications previously tried: None  Patient has a validated, automated, upper arm home BP cuff Current blood pressure readings readings:  Holding stable with mostly controlled readings     Diabetes:  Current medications:  Jardiance  25mg  1/2 tab daily, Farxiga PAP pending to replace  Patient has been HOLDING due to low BG Continues to have low BG overnight ranging from 67-69    Objective:  Lab Results  Component Value Date   HGBA1C 6.1 (H) 04/25/2024    Lab Results  Component Value Date   CREATININE 0.75 04/25/2024   BUN 21 04/25/2024   NA 140 04/25/2024   K 3.8 04/25/2024   CL 102 04/25/2024   CO2 23 04/25/2024    Lab Results  Component Value Date   CHOL 144 04/25/2024   HDL 41 04/25/2024   LDLCALC 71  04/25/2024   TRIG 189 (H) 04/25/2024   CHOLHDL 3.5 04/25/2024    Medications Reviewed Today     Reviewed by [name], RPH (Pharmacist) on 06/14/24 at 1620  Med List Status: <None>   Medication Order Taking? Sig Documenting Provider Last Dose Status Informant  acetaminophen  (TYLENOL ) 500 MG tablet [id number]  Take 500 mg by mouth every 8 (eight) hours as needed (takes only as needed). [provider]  Active   alendronate  (FOSAMAX ) 70 MG tablet [id number]  TAKE 1 TABLET BY MOUTH ONCE A WEEK. TAKE WITH A FULL GLASS OF WATER ON AN EMPTY STOMACH. Jacobo Evalene PARAS, MD  Active   aspirin  EC 81 MG EC tablet [id number]  Take 1 tablet (81 mg total) by mouth daily. Vaickute, Rima, MD  Active Self  atorvastatin  (LIPITOR) 20 MG tablet [id number]  Take 1 tablet (20 mg total) by mouth daily. [name], FNP  Active   Blood Glucose Monitoring Suppl [facility] VERIO) w/Device PRESSLEY [id number]  Use as needed up to twice daily to check blood sugar for verification when Libre reads low or high [name], FNP  Active   Coenzyme Q10 (CO Q 10 PO) [id number]  Take 100 mg by mouth daily.  [provider]  Active Self  Continuous Glucose Receiver (FREESTYLE LIBRE 3 READER) DEVI [id number]  USE AS DIRECTED AS NEEDED [name], FNP  Active   Continuous Glucose Sensor (FREESTYLE LIBRE 3 PLUS SENSOR) MISC [id number]  Change sensor every 15 days. [name], FNP  Active   dapagliflozin propanediol (  FARXIGA) 5 MG TABS tablet [id number]  Take 5 mg by mouth daily.  Patient not taking: Reported on 06/05/2024   [provider]  Active   diltiazem  (CARDIZEM  CD) 180 MG 24 hr capsule [id number]  Take 180 mg by mouth daily. [provider]  Active   docusate sodium (COLACE) 100 MG capsule [id number]  Take 200 mg by mouth 2 (two) times daily. [provider]  Active            Med Note PHEBE, TIFFANY L   Mon Jun 05, 2024  1:42 PM) Takes only every other night,  pm only  econazole nitrate 1 % cream [id number]  Use 1 % CREAM [provider]  Active   fluticasone  (FLONASE ) 50 MCG/ACT nasal spray [id number]  Place 1 spray into both nostrils daily.  Patient not taking: Reported on 06/05/2024   [name], FNP  Active   furosemide (LASIX) 20 MG tablet [id number]  Take 20 mg by mouth daily. [provider]  Active   glucose blood (ONETOUCH VERIO) test strip [id number]  Use as needed up to twice daily to check blood sugar for verification when Libre reads low or high. [name], FNP  Active   isosorbide  mononitrate (IMDUR ) 60 MG 24 hr tablet [id number]  Take 60 mg by mouth daily. [provider]  Active   levothyroxine  (SYNTHROID ) 50 MCG tablet [id number]  Take 1 tablet (50 mcg total) by mouth daily. [name], FNP  Active   losartan  (COZAAR ) 100 MG tablet [id number]  Take 100 mg by mouth daily. [provider]  Active   metFORMIN (GLUCOPHAGE) 500 MG tablet [id number]  Take 0.5 tablets by mouth 2 (two) times daily with a meal.  Patient not taking: Reported on 06/05/2024   [provider]  Active   metoprolol  succinate (TOPROL -XL) 50 MG 24 hr tablet [id number]  Take 1 tablet by mouth daily. [provider]  Active   omeprazole (PRILOSEC) 20 MG capsule [id number]  Take 20 mg by mouth daily as needed (for heartburn).  [provider]  Active Self  triamcinolone  cream (KENALOG ) 0.1 % [id number]  Apply 1 Application topically 2 (two) times daily. [name], FNP  Active   triamterene -hydrochlorothiazide  (MAXZIDE -25) 37.5-25 MG tablet [id number]  Take 0.5 tablets by mouth daily. [name], FNP  Active               Assessment/Plan:   Hypertension: - Currently controlled - Reviewed long term cardiovascular and renal outcomes of uncontrolled blood pressure - Reviewed appropriate blood pressure monitoring technique and reviewed goal blood pressure. Recommended to check home  blood pressure and heart rate twice daily.  - Recommend to continue med therapy   Diabetes: -Continue to hold jardiance . -Counseled that splitting the tablet may have resulted uneven glucose control and contributed to lows. -Continue to pursue Farxiga 5mg  tablet PAP through [place] for CKD protection, informed patient we need her 1040 tax form to continue -Counseled on eating a snack with good protein/fat/carb content to help stabilize sugars overnight, provided examples.    Follow Up Plan: 1 week  Jon VEAR Lindau, PharmD Clinical Pharmacist [phone]

## 2024-06-20 ENCOUNTER — Other Ambulatory Visit

## 2024-06-20 DIAGNOSIS — E1142 Type 2 diabetes mellitus with diabetic polyneuropathy: Secondary | ICD-10-CM

## 2024-06-20 DIAGNOSIS — I10 Essential (primary) hypertension: Secondary | ICD-10-CM

## 2024-06-20 NOTE — Progress Notes (Signed)
 "  06/20/2024 Name: Kelsey Young MRN: [id number] DOB: 05-14-1932  Chief Complaint  Patient presents with   Medication Management   Diabetes   Hypertension    Kelsey Young is a 89 y.o. year old female who presented for a telephone visit.   They were referred to the pharmacist by their PCP for assistance in managing hypertension and medication access.    Subjective:  Care Team: Primary Care Provider: [name], FNP   Medication Access/Adherence  Current Pharmacy:  [facility] [place], [place] - [address] [place] [address] [place] [address] Phone: [phone] Fax: [phone]  [id number] - [place], [place] - [address] [place] [address] Phone: [phone] Fax: [phone]   Patient reports affordability concerns with their medications: Yes  - farxiga/jardiance  Patient reports access/transportation concerns to their pharmacy: No  Patient reports adherence concerns with their medications:  No     Hypertension:  Current medications: Triamterene -hydrochlorothiazide  37.5-25 1/2 tab daily, Losartan  100mg  daily, Diltiazem  180mg , Metoprolol  50mg , Lasix 20mg  Medications previously tried: None  Patient has a validated, automated, upper arm home BP cuff Current blood pressure readings readings:  Holding stable with mostly controlled readings     Diabetes:  Current medications:  Jardiance  25mg  1/2 tab daily, Farxiga PAP pending to replace  Patient has been HOLDING due to low BG Adding in a snack before bed which appears to have resolves issues with recurring low BG alarms overnight    Objective:  Lab Results  Component Value Date   HGBA1C 6.1 (H) 04/25/2024    Lab Results  Component Value Date   CREATININE 0.75 04/25/2024   BUN 21 04/25/2024   NA 140 04/25/2024   K 3.8 04/25/2024   CL 102 04/25/2024   CO2 23 04/25/2024    Lab Results  Component Value Date   CHOL  144 04/25/2024   HDL 41 04/25/2024   LDLCALC 71 04/25/2024   TRIG 189 (H) 04/25/2024   CHOLHDL 3.5 04/25/2024    Medications Reviewed Today     Reviewed by Kelsey Young, RPH (Pharmacist) on 06/20/24 at 1455  Med List Status: <None>   Medication Order Taking? Sig Documenting Provider Last Dose Status Informant  acetaminophen  (TYLENOL ) 500 MG tablet [id number]  Take 500 mg by mouth every 8 (eight) hours as needed (takes only as needed). [provider]  Active   alendronate  (FOSAMAX ) 70 MG tablet [id number]  TAKE 1 TABLET BY MOUTH ONCE A WEEK. TAKE WITH A FULL GLASS OF WATER ON AN EMPTY STOMACH. Kelsey Evalene PARAS, MD  Active   aspirin  EC 81 MG EC tablet [id number]  Take 1 tablet (81 mg total) by mouth daily. Young, Rima, MD  Active Self  atorvastatin  (LIPITOR) 20 MG tablet [id number]  Take 1 tablet (20 mg total) by mouth daily. Kelsey Alan HERO, FNP  Active   Blood Glucose Monitoring Suppl [facility] VERIO) w/Device Kelsey Young [id number]  Use as needed up to twice daily to check blood sugar for verification when Libre reads low or high Kelsey Alan HERO, FNP  Active   Coenzyme Q10 (CO Q 10 PO) [id number]  Take 100 mg by mouth daily.  [provider]  Active Self  Continuous Glucose Receiver (FREESTYLE LIBRE 3 READER) DEVI [id number]  USE AS DIRECTED AS NEEDED Kelsey Alan HERO, FNP  Active   Continuous Glucose Sensor (FREESTYLE LIBRE 3 PLUS SENSOR) MISC [id number]  Change sensor every 15 days.  Kelsey Alan HERO, FNP  Active   dapagliflozin propanediol (FARXIGA) 5 MG TABS tablet [id number]  Take 5 mg by mouth daily.  Patient not taking: Reported on 06/05/2024   [provider]  Active   diltiazem  (CARDIZEM  CD) 180 MG 24 hr capsule [id number]  Take 180 mg by mouth daily. [provider]  Active   docusate sodium (COLACE) 100 MG capsule [id number]  Take 200 mg by mouth 2 (two) times daily. [provider]  Active            Med Note Kelsey Young   Mon  Jun 05, 2024  1:42 PM) Takes only every other night, pm only  econazole nitrate 1 % cream [id number]  Use 1 % CREAM [provider]  Active   fluticasone  (FLONASE ) 50 MCG/ACT nasal spray [id number]  Place 1 spray into both nostrils daily.  Patient not taking: Reported on 06/05/2024   Kelsey Alan HERO, FNP  Active   furosemide (LASIX) 20 MG tablet [id number]  Take 20 mg by mouth daily. [provider]  Active   glucose blood (ONETOUCH VERIO) test strip [id number]  Use as needed up to twice daily to check blood sugar for verification when Libre reads low or high. Kelsey Alan HERO, FNP  Active   isosorbide  mononitrate (IMDUR ) 60 MG 24 hr tablet [id number]  Take 60 mg by mouth daily. [provider]  Active   levothyroxine  (SYNTHROID ) 50 MCG tablet [id number]  Take 1 tablet (50 mcg total) by mouth daily. Kelsey Alan HERO, FNP  Active   losartan  (COZAAR ) 100 MG tablet [id number]  Take 100 mg by mouth daily. [provider]  Active   metFORMIN (GLUCOPHAGE) 500 MG tablet [id number]  Take 0.5 tablets by mouth 2 (two) times daily with a meal.  Patient not taking: Reported on 06/05/2024   [provider]  Active   metoprolol  succinate (TOPROL -XL) 50 MG 24 hr tablet [id number]  Take 1 tablet by mouth daily. [provider]  Active   omeprazole (PRILOSEC) 20 MG capsule [id number]  Take 20 mg by mouth daily as needed (for heartburn).  [provider]  Active Self  triamcinolone  cream (KENALOG ) 0.1 % [id number]  Apply 1 Application topically 2 (two) times daily. Kelsey Alan HERO, FNP  Active   triamterene -hydrochlorothiazide  (MAXZIDE -25) 37.5-25 MG tablet [id number]  Take 0.5 tablets by mouth daily. Kelsey Alan HERO, FNP  Active               Assessment/Plan:   Hypertension: - Currently controlled - Reviewed long term cardiovascular and renal outcomes of uncontrolled blood pressure - Reviewed appropriate blood pressure monitoring technique and  reviewed goal blood pressure. Recommended to check home blood pressure and heart rate twice daily.  - Recommend to continue med therapy   Diabetes: -Continue to hold jardiance . -Counseled that splitting the tablet may have resulted uneven glucose control and contributed to lows. -Continue to pursue Farxiga 5mg  tablet PAP through [place] for CKD protection, turned in patient's proof of income today -Counseled on eating a snack with good protein/fat/carb content to help stabilize sugars overnight, provided examples.    Follow Up Plan: 1 week  Jon VEAR Lindau, PharmD Clinical Pharmacist [phone]  "

## 2024-06-26 NOTE — Assessment & Plan Note (Signed)
 Patient stable.  Well controlled with current therapy.   Continue current meds.

## 2024-06-26 NOTE — Assessment & Plan Note (Signed)
 Continue current diabetes POC, as patient has been well controlled on current regimen.  Will adjust meds if needed based on labs.

## 2024-06-26 NOTE — Assessment & Plan Note (Signed)
 Continue current meds.  Will adjust as needed based on results.  The patient is asked to make an attempt to improve diet and exercise patterns to aid in medical management of this problem. Addressed importance of increasing and maintaining water  intake.

## 2024-06-26 NOTE — Assessment & Plan Note (Signed)
 Continue current therapy for lipid control. Will modify as needed based on labwork results.

## 2024-06-26 NOTE — Assessment & Plan Note (Signed)
 Blood pressure well controlled with current medications.  Continue current therapy.  Will reassess at follow up.

## 2024-06-26 NOTE — Assessment & Plan Note (Signed)
 Will continue supplements as needed.

## 2024-06-26 NOTE — Assessment & Plan Note (Signed)
 Patient is seen by oncology, who manage this condition.  She is well controlled with current therapy.   Will defer to them for further changes to plan of care.

## 2024-06-27 ENCOUNTER — Other Ambulatory Visit: Payer: Self-pay

## 2024-06-27 DIAGNOSIS — E1142 Type 2 diabetes mellitus with diabetic polyneuropathy: Secondary | ICD-10-CM

## 2024-06-27 DIAGNOSIS — I10 Essential (primary) hypertension: Secondary | ICD-10-CM

## 2024-06-28 NOTE — Progress Notes (Signed)
 "  06/28/2024 Name: Kelsey Young MRN: [id number] DOB: May 07, 1932  Chief Complaint  Patient presents with   Medication Management   Diabetes   Hypertension    Kelsey Young is a 89 y.o. year old female who presented for a telephone visit.   They were referred to the pharmacist by their PCP for assistance in managing hypertension and medication access.    Subjective:  Care Team: Primary Care Provider: [name], FNP   Medication Access/Adherence  Current Pharmacy:  [facility] [place], [place] - [address] [place] [address] [place] [address] Phone: [phone] Fax: [phone]  [id number] - [place], [place] - [address] [place] [address] Phone: [phone] Fax: [phone]   Patient reports affordability concerns with their medications: Yes  - farxiga/jardiance  Patient reports access/transportation concerns to their pharmacy: No  Patient reports adherence concerns with their medications:  No     Hypertension:  Current medications: Triamterene -hydrochlorothiazide  37.5-25 1/2 tab daily, Losartan  100mg  daily, Diltiazem  180mg , Metoprolol  50mg , Lasix 20mg  Medications previously tried: None  Patient has a validated, automated, upper arm home BP cuff Current blood pressure readings readings:  Holding stable with mostly controlled readings     Diabetes:  Current medications:  Farxiga 5mg  (pending arrival from PAP)  Adding in a snack before bed which appears to have resolves issues with recurring low BG alarms overnight    Objective:  Lab Results  Component Value Date   HGBA1C 6.1 (H) 04/25/2024    Lab Results  Component Value Date   CREATININE 0.75 04/25/2024   BUN 21 04/25/2024   NA 140 04/25/2024   K 3.8 04/25/2024   CL 102 04/25/2024   CO2 23 04/25/2024    Lab Results  Component Value Date   CHOL 144 04/25/2024   HDL 41 04/25/2024   LDLCALC 71 04/25/2024    TRIG 189 (H) 04/25/2024   CHOLHDL 3.5 04/25/2024    Medications Reviewed Today     Reviewed by [name], RPH (Pharmacist) on 06/28/24 at [phone]  Med List Status: <None>   Medication Order Taking? Sig Documenting Provider Last Dose Status Informant  acetaminophen  (TYLENOL ) 500 MG tablet [id number]  Take 500 mg by mouth every 8 (eight) hours as needed (takes only as needed). [provider]  Active   alendronate  (FOSAMAX ) 70 MG tablet [id number]  TAKE 1 TABLET BY MOUTH ONCE A WEEK. TAKE WITH A FULL GLASS OF WATER ON AN EMPTY STOMACH. Jacobo Evalene PARAS, MD  Active   aspirin  EC 81 MG EC tablet [id number]  Take 1 tablet (81 mg total) by mouth daily. Vaickute, Rima, MD  Active Self  atorvastatin  (LIPITOR) 20 MG tablet [id number]  Take 1 tablet (20 mg total) by mouth daily. [name], FNP  Active   Blood Glucose Monitoring Suppl [facility] VERIO) w/Device PRESSLEY [id number]  Use as needed up to twice daily to check blood sugar for verification when Libre reads low or high [name], FNP  Active   Coenzyme Q10 (CO Q 10 PO) [id number]  Take 100 mg by mouth daily.  [provider]  Active Self  Continuous Glucose Receiver (FREESTYLE LIBRE 3 READER) DEVI [id number]  USE AS DIRECTED AS NEEDED [name], FNP  Active   Continuous Glucose Sensor (FREESTYLE LIBRE 3 PLUS SENSOR) MISC [id number]  Change sensor every 15 days. [name], FNP  Active   dapagliflozin propanediol (FARXIGA) 5  MG TABS tablet [id number]  Take 5 mg by mouth daily.  Patient not taking: Reported on 06/05/2024   [provider]  Active   diltiazem  (CARDIZEM  CD) 180 MG 24 hr capsule [id number]  Take 180 mg by mouth daily. [provider]  Active   docusate sodium (COLACE) 100 MG capsule [id number]  Take 200 mg by mouth 2 (two) times daily. [provider]  Active            Med Note PHEBE, TIFFANY L   Mon Jun 05, 2024  1:42 PM) Takes only every other night, pm only   econazole nitrate 1 % cream [id number]  Use 1 % CREAM [provider]  Active   fluticasone  (FLONASE ) 50 MCG/ACT nasal spray [id number]  Place 1 spray into both nostrils daily.  Patient not taking: Reported on 06/05/2024   [name], FNP  Active   furosemide (LASIX) 20 MG tablet [id number]  Take 20 mg by mouth daily. [provider]  Active   glucose blood (ONETOUCH VERIO) test strip [id number]  Use as needed up to twice daily to check blood sugar for verification when Libre reads low or high. [name], FNP  Active   isosorbide  mononitrate (IMDUR ) 60 MG 24 hr tablet [id number]  Take 60 mg by mouth daily. [provider]  Active   levothyroxine  (SYNTHROID ) 50 MCG tablet [id number]  Take 1 tablet (50 mcg total) by mouth daily. [name], FNP  Active   losartan  (COZAAR ) 100 MG tablet [id number]  Take 100 mg by mouth daily. [provider]  Active   metFORMIN (GLUCOPHAGE) 500 MG tablet [id number]  Take 0.5 tablets by mouth 2 (two) times daily with a meal.  Patient not taking: Reported on 06/05/2024   [provider]  Active   omeprazole (PRILOSEC) 20 MG capsule [id number]  Take 20 mg by mouth daily as needed (for heartburn).  [provider]  Active Self  triamcinolone  cream (KENALOG ) 0.1 % [id number]  Apply 1 Application topically 2 (two) times daily. [name], FNP  Active   triamterene -hydrochlorothiazide  (MAXZIDE -25) 37.5-25 MG tablet [id number]  Take 0.5 tablets by mouth daily. [name], FNP  Active               Assessment/Plan:   Hypertension: - Currently controlled - Reviewed long term cardiovascular and renal outcomes of uncontrolled blood pressure - Reviewed appropriate blood pressure monitoring technique and reviewed goal blood pressure. Recommended to check home blood pressure and heart rate twice daily.  - Recommend to continue med therapy   Diabetes: -START Farxiga 5mg  for CKD, which will  also contribute to BG control. PAP was approved through 06/28/25 through [place]. Should arrive at house within 7-10 business days. -Will monitor closely for low BG with me duse    Follow Up Plan: 2 weeks  Jon VEAR Lindau, PharmD Clinical Pharmacist [phone]  "

## 2024-07-11 ENCOUNTER — Other Ambulatory Visit: Payer: Self-pay

## 2024-07-11 DIAGNOSIS — E1142 Type 2 diabetes mellitus with diabetic polyneuropathy: Secondary | ICD-10-CM

## 2024-07-11 DIAGNOSIS — I10 Essential (primary) hypertension: Secondary | ICD-10-CM

## 2024-07-11 NOTE — Progress Notes (Signed)
 "  07/11/2024 Name: Kelsey Young MRN: [id number] DOB: 1932-04-28  Chief Complaint  Patient presents with   Medication Management   Diabetes   Hypertension    SHONI QUIJAS is a 89 y.o. year old female who presented for a telephone visit.   They were referred to the pharmacist by their PCP for assistance in managing hypertension and medication access.    Subjective:  Care Team: Primary Care Provider: [name], FNP   Medication Access/Adherence  Current Pharmacy:  [facility] [place], [place] - [address] [place] [address] [place] [address] Phone: [phone] Fax: [phone]  [id number] - [place], [place] - [address] [place] [address] Phone: [phone] Fax: [phone]   Patient reports affordability concerns with their medications: No Patient reports access/transportation concerns to their pharmacy: No  Patient reports adherence concerns with their medications:  No     Hypertension:  Current medications: Triamterene -hydrochlorothiazide  37.5-25 1/2 tab daily, Losartan  100mg  daily, Diltiazem  180mg , Metoprolol  50mg , Lasix 20mg  Medications previously tried: None  Patient has a validated, automated, upper arm home BP cuff Current blood pressure readings readings:  Holding stable with mostly controlled readings     Diabetes:  Current medications:  Farxiga 5mg   Adding in a snack before bed which appears to have resolves issues with recurring low BG alarms overnight   Started farxiga when it arrived on Thursday 07/06/24  Reporting well controlled BG at home, some occasional low BG alarms early morning/late overnight around 68  Medication Assistance: Farxiga 5mg  through [facility] through 06/28/25  Does report increased constipation, wondering if farxiga contributed.    Objective:  Lab Results  Component Value Date   HGBA1C 6.1 (H) 04/25/2024    Lab Results  Component  Value Date   CREATININE 0.75 04/25/2024   BUN 21 04/25/2024   NA 140 04/25/2024   K 3.8 04/25/2024   CL 102 04/25/2024   CO2 23 04/25/2024    Lab Results  Component Value Date   CHOL 144 04/25/2024   HDL 41 04/25/2024   LDLCALC 71 04/25/2024   TRIG 189 (H) 04/25/2024   CHOLHDL 3.5 04/25/2024    Medications Reviewed Today     Reviewed by [name], RPH (Pharmacist) on 07/11/24 at 1501  Med List Status: <None>   Medication Order Taking? Sig Documenting Provider Last Dose Status Informant  acetaminophen  (TYLENOL ) 500 MG tablet [id number]  Take 500 mg by mouth every 8 (eight) hours as needed (takes only as needed). [provider]  Active   alendronate  (FOSAMAX ) 70 MG tablet [id number]  TAKE 1 TABLET BY MOUTH ONCE A WEEK. TAKE WITH A FULL GLASS OF WATER ON AN EMPTY STOMACH. Jacobo Evalene PARAS, MD  Active   aspirin  EC 81 MG EC tablet [id number]  Take 1 tablet (81 mg total) by mouth daily. [place], Rima, MD  Active Self  atorvastatin  (LIPITOR) 20 MG tablet [id number]  Take 1 tablet (20 mg total) by mouth daily. [name], FNP  Active   Blood Glucose Monitoring Suppl [facility] VERIO) w/Device PRESSLEY [id number]  Use as needed up to twice daily to check blood sugar for verification when Libre reads low or high [name], FNP  Active   Coenzyme Q10 (CO Q 10 PO) [id number]  Take 100 mg by mouth daily.  [provider]  Active Self  Continuous Glucose Receiver (FREESTYLE LIBRE 3 READER) DEVI [id number]  USE AS DIRECTED  AS NEEDED [name], FNP  Active   Continuous Glucose Sensor (FREESTYLE LIBRE 3 PLUS SENSOR) MISC [id number]  Change sensor every 15 days. [name], FNP  Active   dapagliflozin propanediol (FARXIGA) 5 MG TABS tablet [id number]  Take 5 mg by mouth daily.  Patient not taking: Reported on 06/05/2024   [provider]  Active   diltiazem  (CARDIZEM  CD) 180 MG 24 hr capsule [id number]  Take 180 mg by mouth daily. [provider]  Active   docusate sodium (COLACE) 100 MG capsule [id number]  Take 200 mg by mouth 2 (two) times daily. [provider]  Active            Med Note PHEBE, TIFFANY L   Mon Jun 05, 2024  1:42 PM) Takes only every other night, pm only  econazole nitrate 1 % cream [id number]  Use 1 % CREAM [provider]  Active   fluticasone  (FLONASE ) 50 MCG/ACT nasal spray [id number]  Place 1 spray into both nostrils daily.  Patient not taking: Reported on 06/05/2024   [name], FNP  Active   furosemide (LASIX) 20 MG tablet [id number]  Take 20 mg by mouth daily. [provider]  Expired 07/07/24 2359   glucose blood (ONETOUCH VERIO) test strip [id number]  Use as needed up to twice daily to check blood sugar for verification when Libre reads low or high. [name], FNP  Active   levothyroxine  (SYNTHROID ) 50 MCG tablet [id number]  Take 1 tablet (50 mcg total) by mouth daily. [name], FNP  Active   losartan  (COZAAR ) 100 MG tablet [id number]  Take 100 mg by mouth daily. [provider]  Active   metFORMIN (GLUCOPHAGE) 500 MG tablet [id number]  Take 0.5 tablets by mouth 2 (two) times daily with a meal.  Patient not taking: Reported on 06/05/2024   [provider]  Active   omeprazole (PRILOSEC) 20 MG capsule [id number]  Take 20 mg by mouth daily as needed (for heartburn).  [provider]  Active Self  triamcinolone  cream (KENALOG ) 0.1 % [id number]  Apply 1 Application topically 2 (two) times daily. [name], FNP  Active   triamterene -hydrochlorothiazide  (MAXZIDE -25) 37.5-25 MG tablet [id number]  Take 0.5 tablets by mouth daily. [name], FNP  Active               Assessment/Plan:   Hypertension: - Currently controlled - Reviewed long term cardiovascular and renal outcomes of uncontrolled blood pressure - Reviewed appropriate blood pressure monitoring technique and reviewed goal blood pressure. Recommended  to check home blood pressure and heart rate twice daily.  - Recommend to continue med therapy   Diabetes: -Currently controlled -Continue current medication therapy -Counseled on high fiber, low salt diet, as well as staying hydrated    Follow Up Plan: 2 weeks  Jon VEAR [place], PharmD Clinical Pharmacist [phone]  "

## 2024-07-26 ENCOUNTER — Ambulatory Visit: Admitting: Family

## 2024-08-03 ENCOUNTER — Other Ambulatory Visit: Payer: Self-pay

## 2024-08-03 MED ORDER — LEVOTHYROXINE SODIUM 50 MCG PO TABS: 50.0000 ug | ORAL_TABLET | Freq: Every day | ORAL | 2 refills | Status: AC

## 2024-08-04 ENCOUNTER — Ambulatory Visit: Admitting: Family

## 2024-08-04 ENCOUNTER — Ambulatory Visit: Payer: Self-pay

## 2024-08-15 ENCOUNTER — Other Ambulatory Visit

## 2024-08-16 ENCOUNTER — Ambulatory Visit: Admitting: Family
